# Patient Record
Sex: Female | Born: 1940 | Race: White | Hispanic: No | Smoking: Never smoker
Health system: Southern US, Community
[De-identification: ages and names within clinical notes are randomized; demographics above are authoritative.]

## PROBLEM LIST (undated history)

## (undated) DIAGNOSIS — D649 Anemia, unspecified: Secondary | ICD-10-CM

## (undated) DIAGNOSIS — H269 Unspecified cataract: Secondary | ICD-10-CM

## (undated) DIAGNOSIS — E039 Hypothyroidism, unspecified: Secondary | ICD-10-CM

## (undated) DIAGNOSIS — K859 Acute pancreatitis without necrosis or infection, unspecified: Secondary | ICD-10-CM

## (undated) DIAGNOSIS — G4730 Sleep apnea, unspecified: Secondary | ICD-10-CM

## (undated) DIAGNOSIS — I10 Essential (primary) hypertension: Secondary | ICD-10-CM

## (undated) DIAGNOSIS — E876 Hypokalemia: Secondary | ICD-10-CM

## (undated) DIAGNOSIS — L439 Lichen planus, unspecified: Secondary | ICD-10-CM

## (undated) DIAGNOSIS — Z5189 Encounter for other specified aftercare: Secondary | ICD-10-CM

## (undated) DIAGNOSIS — H409 Unspecified glaucoma: Secondary | ICD-10-CM

## (undated) DIAGNOSIS — K8050 Calculus of bile duct without cholangitis or cholecystitis without obstruction: Secondary | ICD-10-CM

## (undated) DIAGNOSIS — K5790 Diverticulosis of intestine, part unspecified, without perforation or abscess without bleeding: Secondary | ICD-10-CM

## (undated) DIAGNOSIS — T7840XA Allergy, unspecified, initial encounter: Secondary | ICD-10-CM

## (undated) DIAGNOSIS — M1990 Unspecified osteoarthritis, unspecified site: Secondary | ICD-10-CM

## (undated) DIAGNOSIS — J45909 Unspecified asthma, uncomplicated: Secondary | ICD-10-CM

## (undated) DIAGNOSIS — K589 Irritable bowel syndrome without diarrhea: Secondary | ICD-10-CM

## (undated) HISTORY — DX: Unspecified asthma, uncomplicated: J45.909

## (undated) HISTORY — PX: HERNIA REPAIR: SHX51

## (undated) HISTORY — DX: Irritable bowel syndrome, unspecified: K58.9

## (undated) HISTORY — DX: Diverticulosis of intestine, part unspecified, without perforation or abscess without bleeding: K57.90

## (undated) HISTORY — DX: Morbid (severe) obesity due to excess calories: E66.01

## (undated) HISTORY — DX: Unspecified glaucoma: H40.9

## (undated) HISTORY — DX: Allergy, unspecified, initial encounter: T78.40XA

## (undated) HISTORY — DX: Essential (primary) hypertension: I10

## (undated) HISTORY — PX: CHOLECYSTECTOMY: SHX55

## (undated) HISTORY — PX: EYE SURGERY: SHX253

## (undated) HISTORY — DX: Lichen planus, unspecified: L43.9

## (undated) HISTORY — PX: TUBAL LIGATION: SHX77

## (undated) HISTORY — DX: Unspecified cataract: H26.9

## (undated) HISTORY — DX: Unspecified osteoarthritis, unspecified site: M19.90

## (undated) HISTORY — DX: Hypothyroidism, unspecified: E03.9

## (undated) HISTORY — DX: Calculus of bile duct without cholangitis or cholecystitis without obstruction: K80.50

## (undated) HISTORY — DX: Anemia, unspecified: D64.9

## (undated) HISTORY — DX: Hypokalemia: E87.6

## (undated) HISTORY — DX: Acute pancreatitis without necrosis or infection, unspecified: K85.90

## (undated) HISTORY — DX: Encounter for other specified aftercare: Z51.89

## (undated) HISTORY — DX: Sleep apnea, unspecified: G47.30

---

## 1998-11-11 DIAGNOSIS — K8050 Calculus of bile duct without cholangitis or cholecystitis without obstruction: Secondary | ICD-10-CM

## 1998-11-11 HISTORY — DX: Calculus of bile duct without cholangitis or cholecystitis without obstruction: K80.50

## 2006-01-15 ENCOUNTER — Other Ambulatory Visit: Payer: Self-pay

## 2006-01-15 ENCOUNTER — Emergency Department: Payer: Self-pay | Admitting: Emergency Medicine

## 2007-07-09 ENCOUNTER — Ambulatory Visit: Payer: Self-pay | Admitting: Otolaryngology

## 2008-10-01 LAB — HM PAP SMEAR

## 2009-04-18 ENCOUNTER — Emergency Department: Payer: Self-pay | Admitting: Unknown Physician Specialty

## 2009-11-11 HISTORY — PX: COLONOSCOPY: SHX174

## 2009-12-26 ENCOUNTER — Ambulatory Visit: Payer: Self-pay | Admitting: Internal Medicine

## 2010-01-16 ENCOUNTER — Ambulatory Visit: Payer: Self-pay | Admitting: Internal Medicine

## 2010-03-14 ENCOUNTER — Ambulatory Visit: Payer: Self-pay | Admitting: Gastroenterology

## 2010-10-01 LAB — HM COLONOSCOPY: HM Colonoscopy: NORMAL

## 2011-07-16 ENCOUNTER — Encounter: Payer: Self-pay | Admitting: Internal Medicine

## 2011-07-18 ENCOUNTER — Encounter: Payer: Self-pay | Admitting: Internal Medicine

## 2011-07-18 ENCOUNTER — Ambulatory Visit (INDEPENDENT_AMBULATORY_CARE_PROVIDER_SITE_OTHER): Payer: Medicare Other | Admitting: Internal Medicine

## 2011-07-18 DIAGNOSIS — E039 Hypothyroidism, unspecified: Secondary | ICD-10-CM

## 2011-07-18 DIAGNOSIS — Z8679 Personal history of other diseases of the circulatory system: Secondary | ICD-10-CM | POA: Insufficient documentation

## 2011-07-18 DIAGNOSIS — I10 Essential (primary) hypertension: Secondary | ICD-10-CM

## 2011-07-18 DIAGNOSIS — Z0000 Encounter for general adult medical examination without abnormal findings: Secondary | ICD-10-CM

## 2011-07-18 LAB — LIPID PANEL
Cholesterol: 179 mg/dL (ref 0–200)
LDL Cholesterol: 93 mg/dL (ref 0–99)
Total CHOL/HDL Ratio: 3

## 2011-07-18 LAB — COMPREHENSIVE METABOLIC PANEL
ALT: 26 U/L (ref 0–35)
AST: 26 U/L (ref 0–37)
Albumin: 4.2 g/dL (ref 3.5–5.2)
BUN: 18 mg/dL (ref 6–23)
CO2: 27 mEq/L (ref 19–32)
Calcium: 8.7 mg/dL (ref 8.4–10.5)
Chloride: 106 mEq/L (ref 96–112)
Creatinine, Ser: 0.8 mg/dL (ref 0.4–1.2)
GFR: 74.72 mL/min (ref >60.00)
Potassium: 3.6 mEq/L (ref 3.5–5.1)

## 2011-07-18 NOTE — Patient Instructions (Signed)
Labs today. Return in 6 months or earlier as needed.

## 2011-07-18 NOTE — Progress Notes (Signed)
Subjective:    Patient ID: Cathy Coffey, female    DOB: 09/14/1941, 70 y.o.   MRN: [id number]  HPI Cathy Coffey is a 70 year old female who presents for annual exam. She denies any complaints today. She reports good compliance with her medications including Synthroid for hypothyroidism. She also occasionally uses Lasix for lower extremity edema. She also uses Estrace cream to help with vaginal dryness and urinary incontinence. She reports she has been feeling well. She reports good appetite. She has been very active and is back exercising at the gym on a regular basis.   Outpatient Encounter Prescriptions as of 07/18/2011  Medication Sig Dispense Refill  . estradiol (ESTRACE VAGINAL) 0.1 MG/GM vaginal cream Place 2 g vaginally as needed.        . furosemide (LASIX) 20 MG tablet Take 20 mg by mouth daily.        Marland Kitchen levothyroxine (SYNTHROID) 112 MCG tablet Take 112 mcg by mouth daily.        . meclizine (ANTIVERT) 25 MG tablet Take 25 mg by mouth 3 (three) times daily as needed.          Review of Systems  Constitutional: Negative for fever, chills, appetite change, fatigue and unexpected weight change.  HENT: Negative for ear pain, congestion, sore throat, trouble swallowing, neck pain, voice change and sinus pressure.   Eyes: Negative for visual disturbance.  Respiratory: Negative for cough, shortness of breath, wheezing and stridor.   Cardiovascular: Negative for chest pain, palpitations and leg swelling.  Gastrointestinal: Negative for nausea, vomiting, abdominal pain, diarrhea, constipation, blood in stool, abdominal distention and anal bleeding.  Genitourinary: Negative for dysuria and flank pain.  Musculoskeletal: Negative for myalgias, arthralgias and gait problem.  Skin: Negative for color change and rash.  Neurological: Negative for dizziness, weakness and headaches.  Hematological: Negative for adenopathy. Does not bruise/bleed easily.  Psychiatric/Behavioral: Negative for  suicidal ideas, sleep disturbance and dysphoric mood. The patient is not nervous/anxious.        BP 116/82  Pulse 67  Temp(Src) 98.1 F (36.7 C) (Oral)  Resp 14  Ht 5\' 2"  (1.575 m)  Wt 189 lb (85.73 kg)  BMI 34.57 kg/m2  SpO2 96%   Objective:   Physical Exam  Constitutional: She is oriented to person, place, and time. She appears well-developed and well-nourished. No distress.  HENT:  Head: Normocephalic and atraumatic.  Right Ear: External ear normal.  Left Ear: External ear normal.  Nose: Nose normal.  Mouth/Throat: Oropharynx is clear and moist. No oropharyngeal exudate.  Eyes: Conjunctivae are normal. Pupils are equal, round, and reactive to light. Right eye exhibits no discharge. Left eye exhibits no discharge. No scleral icterus.  Neck: Normal range of motion. Neck supple. No tracheal deviation present. No thyromegaly present.  Cardiovascular: Normal rate, regular rhythm, normal heart sounds and intact distal pulses.  Exam reveals no gallop and no friction rub.   No murmur heard. Pulmonary/Chest: Effort normal and breath sounds normal. No respiratory distress. She has no wheezes. She has no rales. She exhibits no tenderness. Right breast exhibits no inverted nipple, no mass, no nipple discharge, no skin change and no tenderness. Left breast exhibits no inverted nipple, no mass, no nipple discharge and no skin change.  Abdominal: Soft. Bowel sounds are normal. She exhibits no distension and no mass. There is no tenderness. There is no rebound and no guarding.  Musculoskeletal: Normal range of motion. She exhibits no edema and no tenderness.  Lymphadenopathy:  She has no cervical adenopathy.  Neurological: She is alert and oriented to person, place, and time. No cranial nerve deficit. She exhibits normal muscle tone. Coordination normal.  Skin: Skin is warm and dry. No rash noted. She is not diaphoretic. No erythema. No pallor.  Psychiatric: She has a normal mood and affect.  Her behavior is normal. Judgment and thought content normal.          Assessment & Plan:  1. General Exam - patient presents for annual exam. Exam including breast exam is normal today. Pap smear was deferred as Pap smear last year was normal.  Patient is up-to-date on health maintenance. She reports recent mammogram. We will request records on this from previous medical office. Lab work is up to date. She will continue her current medications. Encouraged continued healthy diet and exercise. She will return to clinic in 6 months or earlier if needed.  2. Hypertension - BP well controlled. Continue furosemide as needed for lower extremity edema. Repeat BMP today.  3. Hypothyroidism - Continue synthroid. Repeat TSH today.

## 2011-07-23 ENCOUNTER — Telehealth: Payer: Self-pay | Admitting: Internal Medicine

## 2011-07-23 NOTE — Telephone Encounter (Signed)
Pt got lab results in mail has questions about the tsh  Please call pt

## 2011-07-23 NOTE — Telephone Encounter (Signed)
Because the range is 0.35-5.50 and she is at 1.38, she says that she thought it should be some where more in the middle. I advised her that as long as it is in that range it is okay, but told her that I would ask you if she should be concerned in anyway.

## 2011-07-23 NOTE — Telephone Encounter (Signed)
Called patient. She was asking if she should be concerned about the tsh because the range is

## 2011-07-24 NOTE — Telephone Encounter (Signed)
No, actually 1.38 is perfect.  Most physicians aim for 1-2 for TSH.  Anything within the normal range is okay. Unless, she is having any symptoms such as palpitations, anxiety, fatigue, etc.

## 2011-07-24 NOTE — Telephone Encounter (Signed)
Patient notified

## 2011-08-07 ENCOUNTER — Encounter: Payer: Self-pay | Admitting: Internal Medicine

## 2011-08-07 ENCOUNTER — Ambulatory Visit (INDEPENDENT_AMBULATORY_CARE_PROVIDER_SITE_OTHER): Payer: Medicare Other | Admitting: Internal Medicine

## 2011-08-07 DIAGNOSIS — E039 Hypothyroidism, unspecified: Secondary | ICD-10-CM

## 2011-08-07 DIAGNOSIS — R197 Diarrhea, unspecified: Secondary | ICD-10-CM

## 2011-08-07 MED ORDER — DIPHENOXYLATE-ATROPINE 2.5-0.025 MG PO TABS: 1.0000 | ORAL_TABLET | Freq: Four times a day (QID) | ORAL | Status: DC | PRN

## 2011-08-07 NOTE — Patient Instructions (Signed)
Labs today. Start using lomotil as needed for severe diarrhea. Return to clinic in 2 weeks.

## 2011-08-07 NOTE — Progress Notes (Signed)
Subjective:    Patient ID: Cathy Coffey, female    DOB: 1941/06/16, 70 y.o.   MRN: [id number]  HPI Cathy Coffey is a 70 year old female who presents for an acute visit complaining of intermittent diarrhea. She reports that for the last 6 years she has had intermittent watery diarrhea. Over the last several months this has become much more frequent, typically occurring every couple of days. Her diarrhea is described as urgent and explosive. Her stool is brown or yellow in color. She denies any blood in her stool or blood on the toilet paper with wiping. She denies any abdominal pain. She denies any fever or chills. The episodes have been difficult to control and at times she has had incontinence of stool. At one point she reports using toilet paper to try to prevent an accident. She reports having colonoscopies in the past which were normal. The last colonoscopy being approximately 2 or 3 years ago.  Outpatient Encounter Prescriptions as of 08/07/2011  Medication Sig Dispense Refill  . estradiol (ESTRACE VAGINAL) 0.1 MG/GM vaginal cream Place 2 g vaginally as needed.        . furosemide (LASIX) 20 MG tablet Take 20 mg by mouth daily.        Marland Kitchen levothyroxine (SYNTHROID, LEVOTHROID) 100 MCG tablet Take 100 mcg by mouth daily.        . meclizine (ANTIVERT) 25 MG tablet Take 25 mg by mouth 3 (three) times daily as needed.        . diphenoxylate-atropine (LOMOTIL) 2.5-0.025 MG per tablet Take 1 tablet by mouth 4 (four) times daily as needed for diarrhea/loose stools.  30 tablet  1    Review of Systems  Constitutional: Negative for fever, chills, appetite change, fatigue and unexpected weight change.  HENT: Negative for ear pain, congestion, sore throat, trouble swallowing, neck pain, voice change and sinus pressure.   Eyes: Negative for visual disturbance.  Respiratory: Negative for cough, shortness of breath, wheezing and stridor.   Cardiovascular: Negative for chest pain, palpitations and leg  swelling.  Gastrointestinal: Positive for abdominal pain, diarrhea and abdominal distention. Negative for nausea, vomiting, constipation, blood in stool and anal bleeding.  Genitourinary: Negative for dysuria and flank pain.  Musculoskeletal: Negative for myalgias, arthralgias and gait problem.  Skin: Negative for color change and rash.  Neurological: Negative for dizziness and headaches.  Hematological: Negative for adenopathy. Does not bruise/bleed easily.  Psychiatric/Behavioral: Negative for suicidal ideas, sleep disturbance and dysphoric mood. The patient is not nervous/anxious.    BP 116/79  Pulse 83  Temp(Src) 98.2 F (36.8 C) (Oral)  Resp 16  Wt 191 lb 8 oz (86.864 kg)  SpO2 97%     Objective:   Physical Exam  Constitutional: She is oriented to person, place, and time. She appears well-developed and well-nourished. No distress.  HENT:  Head: Normocephalic and atraumatic.  Right Ear: External ear normal.  Left Ear: External ear normal.  Nose: Nose normal.  Mouth/Throat: Oropharynx is clear and moist. No oropharyngeal exudate.  Eyes: Conjunctivae are normal. Pupils are equal, round, and reactive to light. Right eye exhibits no discharge. Left eye exhibits no discharge. No scleral icterus.  Neck: Normal range of motion. Neck supple. No tracheal deviation present. No thyromegaly present.  Cardiovascular: Normal rate, regular rhythm, normal heart sounds and intact distal pulses.  Exam reveals no gallop and no friction rub.   No murmur heard. Pulmonary/Chest: Effort normal and breath sounds normal. No respiratory distress. She has no wheezes.  She has no rales. She exhibits no tenderness.  Abdominal: Soft. Bowel sounds are normal. She exhibits no distension and no mass. There is no tenderness. There is no rebound and no guarding.  Musculoskeletal: Normal range of motion. She exhibits no edema and no tenderness.  Lymphadenopathy:    She has no cervical adenopathy.  Neurological:  She is alert and oriented to person, place, and time. No cranial nerve deficit. She exhibits normal muscle tone. Coordination normal.  Skin: Skin is warm and dry. No rash noted. She is not diaphoretic. No erythema. No pallor.  Psychiatric: She has a normal mood and affect. Her behavior is normal. Judgment and thought content normal.          Assessment & Plan:  1. Diarrhea -patient with intermittent diarrhea which has been occurring for several years. It appears that these episodes have recently become more frequent. She has no signs of infection such as fever or abdominal pain. There is no blood in her stool or abdominal pain to suggest diverticulitis. She reports normal colonoscopies in the past and has never had a diagnosis of inflammatory bowel disease. Her symptoms may be consistent with irritable bowel syndrome. She has had no improvement with over-the-counter probiotics or dietary modification. Given the severity of her symptoms, will try using Lomotil on a when necessary basis. We will send blood work including CBC, CMP, lipase, celiac panel, and TSH today. We will have her followup in 2 weeks. If she does not have significant improvement in her symptoms she will likely need a repeat colonoscopy.  2. Hypothyroidism - will check TSH with labs today.

## 2011-08-08 LAB — CBC WITH DIFFERENTIAL/PLATELET
Basophils Relative: 0.1 % (ref 0.0–3.0)
Eosinophils Absolute: 0.3 10*3/uL (ref 0.0–0.7)
Lymphocytes Relative: 21.7 % (ref 12.0–46.0)
MCHC: 33 g/dL (ref 30.0–36.0)
MCV: 90.8 fl (ref 78.0–100.0)
Monocytes Absolute: 0.7 10*3/uL (ref 0.1–1.0)
Neutrophils Relative %: 64.4 % (ref 43.0–77.0)
Platelets: 207 10*3/uL (ref 150.0–400.0)
RBC: 4.5 Mil/uL (ref 3.87–5.11)
WBC: 7.2 10*3/uL (ref 4.5–10.5)

## 2011-08-08 LAB — GLIADIN ANTIBODIES, SERUM: Gliadin IgA: 4 U/mL (ref <20)

## 2011-08-09 ENCOUNTER — Telehealth: Payer: Self-pay | Admitting: Internal Medicine

## 2011-08-09 ENCOUNTER — Other Ambulatory Visit: Payer: Medicare Other

## 2011-08-09 LAB — COMPREHENSIVE METABOLIC PANEL
ALT: 25 U/L (ref 0–35)
AST: 25 U/L (ref 0–37)
Albumin: 4.2 g/dL (ref 3.5–5.2)
Alkaline Phosphatase: 84 U/L (ref 39–117)
BUN: 18 mg/dL (ref 6–23)
Calcium: 9.3 mg/dL (ref 8.4–10.5)
Chloride: 105 mEq/L (ref 96–112)
Potassium: 3.8 mEq/L (ref 3.5–5.1)
Sodium: 139 mEq/L (ref 135–145)
Total Protein: 7.2 g/dL (ref 6.0–8.3)

## 2011-08-09 LAB — LIPASE: Lipase: 28 U/L (ref 11.0–59.0)

## 2011-08-09 LAB — RETICULIN ANTIBODIES, IGA W TITER: Reticulin Ab, IgA: NEGATIVE

## 2011-08-09 NOTE — Telephone Encounter (Signed)
Message copied by Virgina Evener on Fri Aug 09, 2011  6:44 PM ------      Message from: Ronna Polio A      Created: Fri Aug 09, 2011  6:28 PM       Repeat potassium was normal.

## 2011-08-12 ENCOUNTER — Encounter: Payer: Self-pay | Admitting: Internal Medicine

## 2011-08-12 ENCOUNTER — Encounter: Payer: Self-pay | Admitting: *Deleted

## 2011-08-12 NOTE — Telephone Encounter (Signed)
Patient notified by letter

## 2011-08-20 ENCOUNTER — Encounter: Payer: Self-pay | Admitting: Internal Medicine

## 2011-08-22 ENCOUNTER — Other Ambulatory Visit: Payer: Self-pay | Admitting: Internal Medicine

## 2011-08-22 ENCOUNTER — Ambulatory Visit: Payer: Medicare Other | Admitting: Internal Medicine

## 2011-08-22 MED ORDER — FUROSEMIDE 20 MG PO TABS: 20.0000 mg | ORAL_TABLET | Freq: Every day | ORAL | Status: DC

## 2011-08-22 NOTE — Telephone Encounter (Signed)
Patient needs a refill on her generic lasix.

## 2011-10-21 ENCOUNTER — Ambulatory Visit (INDEPENDENT_AMBULATORY_CARE_PROVIDER_SITE_OTHER): Payer: Medicare Other | Admitting: Internal Medicine

## 2011-10-21 ENCOUNTER — Encounter: Payer: Self-pay | Admitting: Internal Medicine

## 2011-10-21 VITALS — BP 120/78 | HR 84 | Temp 98.4°F | Wt 192.0 lb

## 2011-10-21 DIAGNOSIS — J40 Bronchitis, not specified as acute or chronic: Secondary | ICD-10-CM

## 2011-10-21 MED ORDER — PREDNISONE (PAK) 10 MG PO TABS: ORAL_TABLET | ORAL | Status: AC

## 2011-10-21 MED ORDER — GUAIFENESIN-CODEINE 100-10 MG/5ML PO SYRP: 5.0000 mL | ORAL_SOLUTION | Freq: Two times a day (BID) | ORAL | Status: DC | PRN

## 2011-10-21 MED ORDER — AZITHROMYCIN 250 MG PO TABS: ORAL_TABLET | ORAL | Status: AC

## 2011-10-21 NOTE — Progress Notes (Signed)
  Subjective:    Patient ID: Cathy Coffey, female    DOB: 13-Jul-1941, 70 y.o.   MRN: [id number]  Cough This is a new problem. The current episode started in the past 7 days. The problem has been gradually worsening. The problem occurs constantly. The cough is productive of purulent sputum. Associated symptoms include chest pain, chills, ear pain, a fever, nasal congestion, postnasal drip, rhinorrhea, shortness of breath and wheezing. Pertinent negatives include no myalgias. The symptoms are aggravated by dust. She has tried OTC cough suppressant and rest for the symptoms. The treatment provided no relief. Her past medical history is significant for bronchitis.      Review of Systems  Constitutional: Positive for fever and chills.  HENT: Positive for ear pain, rhinorrhea and postnasal drip.   Respiratory: Positive for cough, shortness of breath and wheezing.   Cardiovascular: Positive for chest pain.  Musculoskeletal: Negative for myalgias.       Objective:   Physical Exam  Constitutional: She is oriented to person, place, and time. She appears well-developed and well-nourished. No distress.  HENT:  Head: Normocephalic and atraumatic.  Right Ear: External ear normal. Tympanic membrane is erythematous. A middle ear effusion is present.  Left Ear: External ear normal. Tympanic membrane is erythematous. A middle ear effusion is present.  Nose: Nose normal.  Mouth/Throat: Oropharynx is clear and moist. No oropharyngeal exudate.  Eyes: Conjunctivae are normal. Pupils are equal, round, and reactive to light. Right eye exhibits no discharge. Left eye exhibits no discharge. No scleral icterus.  Neck: Normal range of motion. Neck supple. No tracheal deviation present. No thyromegaly present.  Cardiovascular: Normal rate, regular rhythm, normal heart sounds and intact distal pulses.  Exam reveals no gallop and no friction rub.   No murmur heard. Pulmonary/Chest: Effort normal. No accessory  muscle usage. Not tachypneic. No respiratory distress. She has decreased breath sounds. She has wheezes. She has rhonchi. She has no rales. She exhibits no tenderness.  Musculoskeletal: Normal range of motion. She exhibits no edema and no tenderness.  Lymphadenopathy:    She has no cervical adenopathy.  Neurological: She is alert and oriented to person, place, and time. No cranial nerve deficit. She exhibits normal muscle tone. Coordination normal.  Skin: Skin is warm and dry. No rash noted. She is not diaphoretic. No erythema. No pallor.  Psychiatric: She has a normal mood and affect. Her behavior is normal. Judgment and thought content normal.          Assessment & Plan:  1. Bronchitis - Will treat with prednisone taper and azithromycin. Pt will use codeine for cough. She will use mucinex and ibuprofen prn. Follow up in 2 weeks or sooner if symptoms not improving.

## 2011-11-18 ENCOUNTER — Other Ambulatory Visit: Payer: Self-pay | Admitting: *Deleted

## 2011-11-18 MED ORDER — FUROSEMIDE 20 MG PO TABS: 20.0000 mg | ORAL_TABLET | Freq: Every day | ORAL | Status: DC

## 2011-11-18 MED ORDER — LEVOTHYROXINE SODIUM 100 MCG PO TABS: 100.0000 ug | ORAL_TABLET | Freq: Every day | ORAL | Status: DC

## 2011-11-18 NOTE — Progress Notes (Signed)
Faxed to mail order pharm. [phone] (fax)  [phone] (phone)

## 2011-12-13 ENCOUNTER — Telehealth: Payer: Self-pay | Admitting: *Deleted

## 2011-12-13 NOTE — Telephone Encounter (Signed)
Patient requesting referral to podiatist for repair of bunion.

## 2011-12-16 NOTE — Telephone Encounter (Signed)
Gave pt # and she will call if referral from our office is needed. [phone] [facility]/[place])

## 2011-12-16 NOTE — Telephone Encounter (Signed)
That is fine. Would recommend Dr. Irving Shows with [facility]

## 2011-12-16 NOTE — Telephone Encounter (Signed)
Pt would like to see MD in [place]. [facility] is in [place], can you suggest anyone else?

## 2011-12-16 NOTE — Telephone Encounter (Signed)
They have office in [place]

## 2011-12-24 ENCOUNTER — Telehealth: Payer: Self-pay | Admitting: Internal Medicine

## 2011-12-24 DIAGNOSIS — R197 Diarrhea, unspecified: Secondary | ICD-10-CM

## 2011-12-24 NOTE — Telephone Encounter (Signed)
Pt called to get refill on diphen atropine tab [employer]

## 2011-12-24 NOTE — Telephone Encounter (Signed)
Fine to fill. 

## 2011-12-25 MED ORDER — DIPHENOXYLATE-ATROPINE 2.5-0.025 MG PO TABS: 1.0000 | ORAL_TABLET | Freq: Four times a day (QID) | ORAL | Status: DC | PRN

## 2011-12-25 NOTE — Telephone Encounter (Signed)
Done,  Patient informed

## 2012-01-22 ENCOUNTER — Ambulatory Visit (INDEPENDENT_AMBULATORY_CARE_PROVIDER_SITE_OTHER): Payer: Medicare Other | Admitting: Internal Medicine

## 2012-01-22 ENCOUNTER — Telehealth: Payer: Self-pay | Admitting: *Deleted

## 2012-01-22 ENCOUNTER — Encounter: Payer: Self-pay | Admitting: Internal Medicine

## 2012-01-22 VITALS — BP 120/82 | HR 114 | Temp 98.2°F | Ht 62.0 in | Wt 193.0 lb

## 2012-01-22 DIAGNOSIS — J40 Bronchitis, not specified as acute or chronic: Secondary | ICD-10-CM | POA: Insufficient documentation

## 2012-01-22 MED ORDER — FLUTICASONE-SALMETEROL 250-50 MCG/DOSE IN AEPB: 1.0000 | INHALATION_SPRAY | Freq: Two times a day (BID) | RESPIRATORY_TRACT | Status: DC

## 2012-01-22 MED ORDER — GUAIFENESIN-CODEINE 100-10 MG/5ML PO SYRP: 5.0000 mL | ORAL_SOLUTION | Freq: Two times a day (BID) | ORAL | Status: AC | PRN

## 2012-01-22 MED ORDER — AMOXICILLIN-POT CLAVULANATE 875-125 MG PO TABS: 1.0000 | ORAL_TABLET | Freq: Two times a day (BID) | ORAL | Status: DC

## 2012-01-22 NOTE — Assessment & Plan Note (Signed)
Symptoms consistent with bronchitis. Will treat with augmentin, given pt recent history of general anesthesia and possibility of aspiration.  Pt will call if symptoms not improving in next 48hr. If no improvement, will get CXR.

## 2012-01-22 NOTE — Telephone Encounter (Signed)
Triage Record Num: [id number] Operator: Geanie Berlin Patient Name: Cathy Coffey Call Date & Time: 01/22/2012 2:46:57PM Patient Phone: [phone] PCP: Ronna Polio Patient Gender: Female PCP Fax : [phone] Patient DOB: 01/04/1941 Practice Name: [facility] Reason for Call: Caller: [name]/Patient; PCP: Ronna Polio; CB#: [phone]; Call regarding Cough/Congestion; Mild cough began approx 01/03/12 and progressed to productive, frequent cough since approx 01/11/12. Out of Advair. Requesting antibiotic for "bronchitis." Informed of MD order that must be seen for antibiotic. Declined triage. Appt scheduled for 1545 01/22/12 with Dr. Henreitta Leber for caller requesting appt per PCP Call Guideline. Protocol(s) Used: PCP Calls, No Triage (Adult) Recommended Outcome per Protocol: Call Provider within 72 Hours Override Outcome if Used in Protocol: Information Noted and Sent to Office RN Reason for Override Outcome: Rn Scheduled Appt For Patient. Reason for Outcome: Caller requesting an appointment, triage offered and declined Care Advice: ~ 01/22/2012 2:58:40PM Page 1 of 1 [email or web address]

## 2012-01-22 NOTE — Progress Notes (Signed)
Subjective:    Patient ID: Cathy Coffey, female    DOB: [birth year], 71 y.o.   MRN: [id number]  HPI 71YO female presents for acute visit c/o 3 week history of cough productive of purulent sputum.  Symptoms first began after recent surgical procedure on her foot.  She questions whether exposure to general anesthesia started cough.  She recently completed course of unknown antibiotic for possible wound infection in her foot, but denies any improvement in her cough with this.  She denies dyspnea or chest pain.  She has had chills but no fever.  She has been taking Codeine based cough syrup with minimal improvement in symptoms.  Outpatient Encounter Prescriptions as of 01/22/2012  Medication Sig Dispense Refill  . diphenoxylate-atropine (LOMOTIL) 2.5-0.025 MG per tablet Take 1 tablet by mouth 4 (four) times daily as needed for diarrhea or loose stools.  30 tablet  1  . estradiol (ESTRACE VAGINAL) 0.1 MG/GM vaginal cream Place 2 g vaginally as needed.        . furosemide (LASIX) 20 MG tablet Take 1 tablet (20 mg total) by mouth daily.  90 tablet  3  . guaiFENesin-codeine (ROBITUSSIN AC) 100-10 MG/5ML syrup Take 5 mLs by mouth 2 (two) times daily as needed for cough.  240 mL  0  . levothyroxine (SYNTHROID, LEVOTHROID) 100 MCG tablet Take 1 tablet (100 mcg total) by mouth daily.  90 tablet  3  . meclizine (ANTIVERT) 25 MG tablet Take 25 mg by mouth 3 (three) times daily as needed.        Marland Kitchen DISCONTD: guaiFENesin-codeine (ROBITUSSIN AC) 100-10 MG/5ML syrup Take 5 mLs by mouth 2 (two) times daily as needed for cough.  240 mL  0  . amoxicillin-clavulanate (AUGMENTIN) 875-125 MG per tablet Take 1 tablet by mouth 2 (two) times daily.  20 tablet  0  . Fluticasone-Salmeterol (ADVAIR DISKUS) 250-50 MCG/DOSE AEPB Inhale 1 puff into the lungs 2 (two) times daily.  1 each  3    Review of Systems  Constitutional: Positive for chills. Negative for fever and unexpected weight change.  HENT: Negative for hearing  loss, ear pain, nosebleeds, congestion, sore throat, facial swelling, rhinorrhea, sneezing, mouth sores, trouble swallowing, neck pain, neck stiffness, voice change, postnasal drip, sinus pressure, tinnitus and ear discharge.   Eyes: Negative for pain, discharge, redness and visual disturbance.  Respiratory: Positive for cough. Negative for chest tightness, shortness of breath, wheezing and stridor.   Cardiovascular: Negative for chest pain, palpitations and leg swelling.  Musculoskeletal: Negative for myalgias and arthralgias.  Skin: Negative for color change and rash.  Neurological: Negative for dizziness, weakness, light-headedness and headaches.  Hematological: Negative for adenopathy.   BP 120/82  Pulse 114  Temp(Src) 98.2 F (36.8 C) (Oral)  Ht 5\' 2"  (1.575 m)  Wt 193 lb (87.544 kg)  BMI 35.30 kg/m2  SpO2 96%     Objective:   Physical Exam  Constitutional: She is oriented to person, place, and time. She appears well-developed and well-nourished. No distress.  HENT:  Head: Normocephalic and atraumatic.  Right Ear: External ear normal. Tympanic membrane is erythematous and bulging.  Left Ear: External ear normal. Tympanic membrane is not erythematous and not bulging.  Nose: Nose normal.  Mouth/Throat: Oropharynx is clear and moist. No oropharyngeal exudate.  Eyes: Conjunctivae are normal. Pupils are equal, round, and reactive to light. Right eye exhibits no discharge. Left eye exhibits no discharge. No scleral icterus.  Neck: Normal range of motion. Neck supple. No  tracheal deviation present. No thyromegaly present.  Cardiovascular: Normal rate, regular rhythm, normal heart sounds and intact distal pulses.  Exam reveals no gallop and no friction rub.   No murmur heard. Pulmonary/Chest: Effort normal. No accessory muscle usage. Not tachypneic. No respiratory distress. She has no wheezes. She has rhonchi (diffuse). She has no rales. She exhibits no tenderness.  Musculoskeletal:  Normal range of motion. She exhibits no edema and no tenderness.  Lymphadenopathy:    She has no cervical adenopathy.  Neurological: She is alert and oriented to person, place, and time. No cranial nerve deficit. She exhibits normal muscle tone. Coordination normal.  Skin: Skin is warm and dry. No rash noted. She is not diaphoretic. No erythema. No pallor.  Psychiatric: She has a normal mood and affect. Her behavior is normal. Judgment and thought content normal.          Assessment & Plan:

## 2012-01-28 ENCOUNTER — Telehealth: Payer: Self-pay | Admitting: *Deleted

## 2012-01-28 NOTE — Telephone Encounter (Signed)
Patient notified. She will give it a few more days and if she is not feeling any better at that point will call back and schedule appt.

## 2012-01-28 NOTE — Telephone Encounter (Signed)
Triage Record Num: [id number] Operator: Craig Guess Patient Name: Cathy Coffey Call Date & Time: 01/28/2012 11:30:42AM Patient Phone: [phone] PCP: Ronna Polio Patient Gender: Female PCP Fax : [phone] Patient DOB: [birth year] Practice Name: [facility] Reason for Call: Caller: [name]/Patient; PCP: Ronna Polio; CB#: [phone]. Caller reports she was seen in the office on Wed 3/13 and dx'd with Bronchitis and possibly Pneumonia. Caller was advised to callback if sxs did not improve. Started on Augmentin bid and is still having lots of congestion. Afebrile. Caller reports she is no better than when she was seen, but no worse. Still taking Advair, Antibxs and Mucinex as directed. Caller asking for direction and would like MD to be aware of same. Caller questions if Mucinex will cause increased coughing. Caller given info and advised Mucinex will increase coughing as it breaks up congestion. Advised to increase fluid intake. Caller now reports she will give antibxs a few more days, but would like to speak with MD when she is available. Protocol(s) Used: Office Note Recommended Outcome per Protocol: Information Noted and Sent to Office Reason for Outcome: Caller information to office Care Advice: ~ 03/

## 2012-01-28 NOTE — Telephone Encounter (Signed)
If no improvement by tomorrow, should be seen, however cough from viral infection can persist up to 1 month.

## 2012-02-27 ENCOUNTER — Encounter: Payer: Self-pay | Admitting: Internal Medicine

## 2012-03-02 ENCOUNTER — Ambulatory Visit (INDEPENDENT_AMBULATORY_CARE_PROVIDER_SITE_OTHER): Payer: Medicare Other | Admitting: Internal Medicine

## 2012-03-02 ENCOUNTER — Encounter: Payer: Self-pay | Admitting: Internal Medicine

## 2012-03-02 VITALS — BP 130/74 | HR 71 | Temp 98.1°F | Resp 16 | Wt 194.5 lb

## 2012-03-02 DIAGNOSIS — H6690 Otitis media, unspecified, unspecified ear: Secondary | ICD-10-CM | POA: Insufficient documentation

## 2012-03-02 DIAGNOSIS — H6691 Otitis media, unspecified, right ear: Secondary | ICD-10-CM

## 2012-03-02 MED ORDER — LEVOFLOXACIN 500 MG PO TABS: 500.0000 mg | ORAL_TABLET | Freq: Every day | ORAL | Status: AC

## 2012-03-02 NOTE — Patient Instructions (Signed)
.  You have a sinus/ear infection   .  I am prescribing an antibiotic (levaquin ) to manage the infection and the inflammation in your ear/sinuses.   I also advise use of the following OTC meds to help with your other symptoms.   Take generic OTC benadryl 25 mg every 8 hours for the drainage,  Sudafed PE  10 to 30 mg every 8 hours for the congestion, you may substitute Afrin nasal spray for the nighttime dose of sudafed PE  If needed to prevent insomnia.  flushes your sinuses twice daily with Simply Saline (do over the sink because if you do it right you will spit out globs of mucus)  Use benzonatate capsules or OTC  Delsym   FOR THE COUGH.  Gargle with salt water as needed for sore throat.   Use vicodin for ear pain

## 2012-03-02 NOTE — Progress Notes (Signed)
Patient ID: Cathy Coffey, female   DOB: 02/11/1941, 71 y.o.   MRN: [id number]   Patient Active Problem List  Diagnoses  . Hypertension  . Hypothyroidism  . Bronchitis  . Otitis media    Subjective:  CC:   Chief Complaint  Patient presents with  . Lymphadenopathy    HPI:   Cathy Coffey a 71 y.o. female who presents 4 day history of allergic rhinitis aggravated by outside activities.  Saturday throat felt swollen .  By Sunday night she had noticed some right-sided lymphadenopathy and pain in her right ear with increasing pressure..  She has been taking mucinex without a decongestant for management of green nasal drainage. No sinus pain but having frontal headaches which are new.   No fevers.     Past Medical History  Diagnosis Date  . Asthma   . Hypothyroidism   . Hypertension   . Lichen planus     Past Surgical History  Procedure Date  . Cholecystectomy   . Hernia repair          The following portions of the patient's history were reviewed and updated as appropriate: Allergies, current medications, and problem list.    Review of Systems:   12 Pt  review of systems was negative except those addressed in the HPI,     History   Social History  . Marital Status: Divorced    Spouse Name: N/A    Number of Children: N/A  . Years of Education: N/A   Occupational History  . Not on file.   Social History Main Topics  . Smoking status: Never Smoker   . Smokeless tobacco: Never Used  . Alcohol Use: Not on file  . Drug Use: Not on file  . Sexually Active: Not on file   Other Topics Concern  . Not on file   Social History Narrative  . No narrative on file    Objective:  BP 130/74  Pulse 71  Temp(Src) 98.1 F (36.7 C) (Oral)  Resp 16  Wt 194 lb 8 oz (88.225 kg)  SpO2 96%  General appearance: alert, cooperative and appears stated age Ears: normal TM's and external ear canals both ears Throat: lips, mucosa, and tongue normal; teeth and  gums normal Neck: no adenopathy, no carotid bruit, supple, symmetrical, trachea midline and thyroid not enlarged, symmetric, no tenderness/mass/nodules Back: symmetric, no curvature. ROM normal. No CVA tenderness. Lungs: clear to auscultation bilaterally Heart: regular rate and rhythm, S1, S2 normal, no murmur, click, rub or gallop Abdomen: soft, non-tender; bowel sounds normal; no masses,  no organomegaly Pulses: 2+ and symmetric Skin: Skin color, texture, turgor normal. No rashes or lesions Lymph nodes: Cervical, supraclavicular, and axillary nodes normal.  Assessment and Plan:  Otitis media Right ear,  With pain , cervical LAD and headache.  Recently treated with amox/clav one month ago so will treat with levaquin.     Updated Medication List Outpatient Encounter Prescriptions as of 03/02/2012  Medication Sig Dispense Refill  . diphenoxylate-atropine (LOMOTIL) 2.5-0.025 MG per tablet Take 1 tablet by mouth 4 (four) times daily as needed for diarrhea or loose stools.  30 tablet  1  . estradiol (ESTRACE VAGINAL) 0.1 MG/GM vaginal cream Place 2 g vaginally as needed.        . Fluticasone-Salmeterol (ADVAIR DISKUS) 250-50 MCG/DOSE AEPB Inhale 1 puff into the lungs 2 (two) times daily.  1 each  3  . furosemide (LASIX) 20 MG tablet Take 1 tablet (20  mg total) by mouth daily.  90 tablet  3  . guaiFENesin-codeine (ROBITUSSIN AC) 100-10 MG/5ML syrup Take 5 mLs by mouth 2 (two) times daily as needed for cough.  240 mL  0  . levothyroxine (SYNTHROID, LEVOTHROID) 100 MCG tablet Take 1 tablet (100 mcg total) by mouth daily.  90 tablet  3  . meclizine (ANTIVERT) 25 MG tablet Take 25 mg by mouth 3 (three) times daily as needed.        Marland Kitchen levofloxacin (LEVAQUIN) 500 MG tablet Take 1 tablet (500 mg total) by mouth daily.  7 tablet  0     No orders of the defined types were placed in this encounter.    No Follow-up on file.

## 2012-03-02 NOTE — Assessment & Plan Note (Addendum)
Right ear,  With pain , cervical LAD and headache.  Recently treated with amox/clav one month ago so will treat with levaquin.

## 2012-03-12 ENCOUNTER — Other Ambulatory Visit: Payer: Self-pay | Admitting: *Deleted

## 2012-03-12 DIAGNOSIS — R197 Diarrhea, unspecified: Secondary | ICD-10-CM

## 2012-03-12 MED ORDER — DIPHENOXYLATE-ATROPINE 2.5-0.025 MG PO TABS: 1.0000 | ORAL_TABLET | Freq: Four times a day (QID) | ORAL | Status: DC | PRN

## 2012-03-12 NOTE — Telephone Encounter (Signed)
Fine to refill #30 with 2 refill

## 2012-03-12 NOTE — Telephone Encounter (Signed)
Rx sent to pharmacy   

## 2012-03-12 NOTE — Telephone Encounter (Signed)
Request refill Lomotil [for IBS] [Last refill 02.13.13 #30x1] Please advise.

## 2012-04-10 ENCOUNTER — Telehealth: Payer: Self-pay | Admitting: Internal Medicine

## 2012-04-10 NOTE — Telephone Encounter (Signed)
Patient left a voice mail on 5.30.13 she would like to speak to the doctor.

## 2012-04-10 NOTE — Telephone Encounter (Signed)
Spoke w/caller who did not have time to discuss concerns w/me at that time, as she was taking her Dad to [facility]. Her first concern is regarding her mother, Roderic Scarce, and she will call back to leave a detailed message as to what we can help her with; so that I may forward these concerns to JAW at her convenience/SLS

## 2012-05-26 ENCOUNTER — Other Ambulatory Visit: Payer: Self-pay | Admitting: Internal Medicine

## 2012-07-30 ENCOUNTER — Other Ambulatory Visit: Payer: Self-pay | Admitting: *Deleted

## 2012-07-30 DIAGNOSIS — R197 Diarrhea, unspecified: Secondary | ICD-10-CM

## 2012-07-30 MED ORDER — DIPHENOXYLATE-ATROPINE 2.5-0.025 MG PO TABS: 1.0000 | ORAL_TABLET | Freq: Four times a day (QID) | ORAL | Status: DC | PRN

## 2012-07-30 NOTE — Telephone Encounter (Signed)
Rx called to [facility] pharmacy.

## 2012-10-01 ENCOUNTER — Encounter: Payer: Self-pay | Admitting: Internal Medicine

## 2012-10-01 ENCOUNTER — Ambulatory Visit (INDEPENDENT_AMBULATORY_CARE_PROVIDER_SITE_OTHER): Payer: Medicare Other | Admitting: Internal Medicine

## 2012-10-01 VITALS — BP 120/80 | HR 61 | Temp 98.0°F | Resp 16 | Ht 60.0 in | Wt 192.0 lb

## 2012-10-01 DIAGNOSIS — D510 Vitamin B12 deficiency anemia due to intrinsic factor deficiency: Secondary | ICD-10-CM

## 2012-10-01 DIAGNOSIS — Z0000 Encounter for general adult medical examination without abnormal findings: Secondary | ICD-10-CM | POA: Insufficient documentation

## 2012-10-01 DIAGNOSIS — R197 Diarrhea, unspecified: Secondary | ICD-10-CM

## 2012-10-01 DIAGNOSIS — Z23 Encounter for immunization: Secondary | ICD-10-CM

## 2012-10-01 DIAGNOSIS — N390 Urinary tract infection, site not specified: Secondary | ICD-10-CM

## 2012-10-01 DIAGNOSIS — Z1239 Encounter for other screening for malignant neoplasm of breast: Secondary | ICD-10-CM

## 2012-10-01 DIAGNOSIS — E039 Hypothyroidism, unspecified: Secondary | ICD-10-CM

## 2012-10-01 DIAGNOSIS — E785 Hyperlipidemia, unspecified: Secondary | ICD-10-CM

## 2012-10-01 LAB — COMPREHENSIVE METABOLIC PANEL
ALT: 23 U/L (ref 0–35)
AST: 22 U/L (ref 0–37)
Albumin: 4.1 g/dL (ref 3.5–5.2)
Alkaline Phosphatase: 81 U/L (ref 39–117)
BUN: 18 mg/dL (ref 6–23)
Calcium: 9.1 mg/dL (ref 8.4–10.5)
Chloride: 102 mEq/L (ref 96–112)
Creatinine, Ser: 0.8 mg/dL (ref 0.4–1.2)
Potassium: 3.6 mEq/L (ref 3.5–5.1)

## 2012-10-01 LAB — CBC WITH DIFFERENTIAL/PLATELET
Basophils Absolute: 0 10*3/uL (ref 0.0–0.1)
Eosinophils Absolute: 0.3 10*3/uL (ref 0.0–0.7)
Lymphocytes Relative: 20.8 % (ref 12.0–46.0)
MCHC: 33 g/dL (ref 30.0–36.0)
MCV: 89.4 fl (ref 78.0–100.0)
Monocytes Absolute: 0.5 10*3/uL (ref 0.1–1.0)
Neutrophils Relative %: 65.8 % (ref 43.0–77.0)
Platelets: 209 10*3/uL (ref 150.0–400.0)
RDW: 13.8 % (ref 11.5–14.6)

## 2012-10-01 LAB — LIPID PANEL
HDL: 44.6 mg/dL (ref >39.00)
Total CHOL/HDL Ratio: 5
Triglycerides: 223 mg/dL — ABNORMAL HIGH (ref 0.0–149.0)

## 2012-10-01 LAB — POCT URINALYSIS DIPSTICK
Blood, UA: NEGATIVE
Glucose, UA: NEGATIVE
Nitrite, UA: NEGATIVE
Urobilinogen, UA: 0.2

## 2012-10-01 LAB — VITAMIN B12: Vitamin B-12: 776 pg/mL (ref 211–911)

## 2012-10-01 MED ORDER — PNEUMOCOCCAL VAC POLYVALENT 25 MCG/0.5ML IJ INJ: 0.5000 mL | INJECTION | Freq: Once | INTRAMUSCULAR | Status: DC

## 2012-10-01 MED ORDER — DIPHENOXYLATE-ATROPINE 2.5-0.025 MG PO TABS: 2.0000 | ORAL_TABLET | Freq: Four times a day (QID) | ORAL | Status: DC | PRN

## 2012-10-01 NOTE — Assessment & Plan Note (Signed)
Recently symptomatic with dry skin, fatigue, worsening depression. Will check TSH with labs today. Follow up 3 months and prn.

## 2012-10-01 NOTE — Assessment & Plan Note (Signed)
Gen exam normal today including breast exam. PAP and pelvic deferred because of age and h/o all normal PAP.  Will schedule mammogram.  Will check labs including CBC, CMP, lipids, TSH.  Follow up 3 months and prn.

## 2012-10-01 NOTE — Progress Notes (Signed)
Subjective:    Patient ID: ZAHNIYA ZELLARS, female    DOB: [birth year], 71 y.o.   MRN: [id number]  HPI The patient is here for annual Medicare wellness examination and management of other chronic and acute problems.   The risk factors are reflected in the social history.  The roster of all physicians providing medical care to patient - is listed in the Snapshot section of the chart.  Activities of daily living:  The patient is 100% independent in all ADLs: dressing, toileting, feeding as well as independent mobility  Home safety : The patient has smoke detectors in the home. They wear seatbelts.  There are no firearms at home. There is no violence in the home.   There is no risks for hepatitis, STDs or HIV. There is history of blood transfusion in 1960s. They have no travel history to infectious disease endemic areas of the world.  The patient has seen their dentist in the last six month. (Dr. Mathews Robinsons) They have seen their eye doctor in the last year.  [facility]) No issues with hearing They have deferred audiologic testing in the last year.    They do not  have excessive sun exposure. Discussed the need for sun protection: hats, long sleeves and use of sunscreen if there is significant sun exposure. Dermatologist - none recently.  Diet: the importance of a healthy diet is discussed. They do have a healthy diet.  The benefits of regular aerobic exercise were discussed. Limited by family responsibilities.  Depression screen: there are no signs or vegative symptoms of depression- irritability, change in appetite, anhedonia, sadness/tearfullness. Recent worsening of symptoms, with ongoing responsibilities caring for parents and granddaughter.  Cognitive assessment: the patient manages all their financial and personal affairs and is actively engaged. They could relate day,date,year and events.  The following portions of the patient's history were reviewed and updated as appropriate:  allergies, current medications, past family history, past medical history,  past surgical history, past social history  and problem list.  Visual acuity was not assessed per patient preference since she has regular follow up with her ophthalmologist. Hearing and body mass index were assessed and reviewed.   During the course of the visit the patient was educated and counseled about appropriate screening and preventive services including : fall prevention , diabetes screening, nutrition counseling, colorectal cancer screening, and recommended immunizations.     Outpatient Encounter Prescriptions as of 10/01/2012  Medication Sig Dispense Refill  . amoxicillin-clavulanate (AUGMENTIN) 875-125 MG per tablet TAKE 1 TABLET BY MOUTH TWICE A DAY  20 tablet  0  . Clobetasol Prop Emollient Base 0.05 % emollient cream Apply topically 2 (two) times daily.      . diphenoxylate-atropine (LOMOTIL) 2.5-0.025 MG per tablet Take 2 tablets by mouth 4 (four) times daily as needed for diarrhea or loose stools.  60 tablet  3  . estradiol (ESTRACE VAGINAL) 0.1 MG/GM vaginal cream Place 2 g vaginally as needed.        . Fluticasone-Salmeterol (ADVAIR DISKUS) 250-50 MCG/DOSE AEPB Inhale 1 puff into the lungs 2 (two) times daily.  1 each  3  . furosemide (LASIX) 20 MG tablet Take 1 tablet (20 mg total) by mouth daily.  90 tablet  3  . guaiFENesin-codeine (ROBITUSSIN AC) 100-10 MG/5ML syrup Take 5 mLs by mouth 2 (two) times daily as needed for cough.  240 mL  0  . levothyroxine (SYNTHROID, LEVOTHROID) 100 MCG tablet Take 1 tablet (100 mcg total) by mouth  daily.  90 tablet  3  . meclizine (ANTIVERT) 25 MG tablet Take 25 mg by mouth 3 (three) times daily as needed.        . [DISCONTINUED] diphenoxylate-atropine (LOMOTIL) 2.5-0.025 MG per tablet Take 1 tablet by mouth 4 (four) times daily as needed for diarrhea or loose stools.  30 tablet  0   Facility-Administered Encounter Medications as of 10/01/2012  Medication Dose Route  Frequency Provider Last Rate Last Dose  . pneumococcal 23 valent vaccine (PNU-IMMUNE) injection 0.5 mL  0.5 mL Intramuscular Once Shelia Media, MD        Review of Systems  Constitutional: Negative for fever, chills, appetite change, fatigue and unexpected weight change.  HENT: Negative for ear pain, congestion, sore throat, trouble swallowing, neck pain, voice change and sinus pressure.   Eyes: Negative for visual disturbance.  Respiratory: Negative for cough, shortness of breath, wheezing and stridor.   Cardiovascular: Negative for chest pain, palpitations and leg swelling.  Gastrointestinal: Negative for nausea, vomiting, abdominal pain, diarrhea, constipation, blood in stool, abdominal distention and anal bleeding.  Genitourinary: Negative for dysuria and flank pain.  Musculoskeletal: Negative for myalgias, arthralgias and gait problem.  Skin: Negative for color change and rash.  Neurological: Negative for dizziness and headaches.  Hematological: Negative for adenopathy. Does not bruise/bleed easily.  Psychiatric/Behavioral: Positive for dysphoric mood. Negative for suicidal ideas and sleep disturbance. The patient is not nervous/anxious.        Objective:   Physical Exam  Constitutional: She is oriented to person, place, and time. She appears well-developed and well-nourished. No distress.  HENT:  Head: Normocephalic and atraumatic.  Right Ear: External ear normal.  Left Ear: External ear normal.  Nose: Nose normal.  Mouth/Throat: Oropharynx is clear and moist. No oropharyngeal exudate.  Eyes: Conjunctivae normal are normal. Pupils are equal, round, and reactive to light. Right eye exhibits no discharge. Left eye exhibits no discharge. No scleral icterus.  Neck: Normal range of motion. Neck supple. No tracheal deviation present. No thyromegaly present.  Cardiovascular: Normal rate, regular rhythm, normal heart sounds and intact distal pulses.  Exam reveals no gallop and no  friction rub.   No murmur heard. Pulmonary/Chest: Breath sounds normal. No accessory muscle usage. Not tachypneic. No respiratory distress. She has no decreased breath sounds. She has no wheezes. She has no rhonchi. She has no rales. She exhibits no tenderness. Right breast exhibits no inverted nipple, no mass, no nipple discharge, no skin change and no tenderness. Left breast exhibits no inverted nipple, no mass, no nipple discharge, no skin change and no tenderness. Breasts are symmetrical.  Abdominal: Soft. Bowel sounds are normal. She exhibits no distension and no mass. There is no tenderness. There is no rebound and no guarding.  Musculoskeletal: Normal range of motion. She exhibits no edema and no tenderness.  Lymphadenopathy:    She has no cervical adenopathy.  Neurological: She is alert and oriented to person, place, and time. No cranial nerve deficit. She exhibits normal muscle tone. Coordination normal.  Skin: Skin is warm and dry. No rash noted. She is not diaphoretic. No erythema. No pallor.  Psychiatric: She has a normal mood and affect. Her behavior is normal. Judgment and thought content normal.          Assessment & Plan:

## 2012-10-02 ENCOUNTER — Telehealth: Payer: Self-pay | Admitting: Internal Medicine

## 2012-10-02 NOTE — Telephone Encounter (Signed)
Pt come in today wanting someone to call her about her labs she has some ?

## 2012-10-07 NOTE — Telephone Encounter (Signed)
Pt called back and labs explained.

## 2013-02-03 ENCOUNTER — Emergency Department: Payer: Self-pay | Admitting: Emergency Medicine

## 2013-02-03 LAB — CBC
HCT: 40 % (ref 35.0–47.0)
HGB: 13.2 g/dL (ref 12.0–16.0)
Platelet: 197 10*3/uL (ref 150–440)
RBC: 4.56 10*6/uL (ref 3.80–5.20)
RDW: 13.4 % (ref 11.5–14.5)

## 2013-02-03 LAB — BASIC METABOLIC PANEL
BUN: 14 mg/dL (ref 7–18)
Chloride: 103 mmol/L (ref 98–107)
Co2: 28 mmol/L (ref 21–32)
Creatinine: 0.94 mg/dL (ref 0.60–1.30)
EGFR (African American): >60
EGFR (Non-African Amer.): >60
Glucose: 102 mg/dL — ABNORMAL HIGH (ref 65–99)
Osmolality: 274 (ref 275–301)
Potassium: 3.8 mmol/L (ref 3.5–5.1)
Sodium: 137 mmol/L (ref 136–145)

## 2013-02-03 LAB — URINALYSIS, COMPLETE
Bacteria: NONE SEEN
Bilirubin,UR: NEGATIVE
Blood: NEGATIVE
Glucose,UR: NEGATIVE mg/dL (ref 0–75)
Ketone: NEGATIVE
Leukocyte Esterase: NEGATIVE
Ph: 8 (ref 4.5–8.0)
Protein: NEGATIVE
Squamous Epithelial: NONE SEEN
WBC UR: NONE SEEN /HPF (ref 0–5)

## 2013-02-03 LAB — TROPONIN I: Troponin-I: <0.02 ng/mL

## 2013-02-05 ENCOUNTER — Encounter: Payer: Self-pay | Admitting: Adult Health

## 2013-02-05 ENCOUNTER — Ambulatory Visit (INDEPENDENT_AMBULATORY_CARE_PROVIDER_SITE_OTHER): Payer: Medicare Other | Admitting: Adult Health

## 2013-02-05 VITALS — BP 102/80 | HR 85 | Temp 98.0°F | Resp 14 | Ht 60.0 in | Wt 188.0 lb

## 2013-02-05 DIAGNOSIS — R5381 Other malaise: Secondary | ICD-10-CM

## 2013-02-05 DIAGNOSIS — R5383 Other fatigue: Secondary | ICD-10-CM

## 2013-02-05 DIAGNOSIS — Z09 Encounter for follow-up examination after completed treatment for conditions other than malignant neoplasm: Secondary | ICD-10-CM

## 2013-02-05 LAB — TSH: TSH: 0.61 u[IU]/mL (ref 0.35–5.50)

## 2013-02-05 NOTE — Patient Instructions (Addendum)
  I am requesting your medical records from your previous visit to the emergency room.  Continue to take your blood pressure medication.  I am checking your thyroid function.  Once I get the results we will let you know.

## 2013-02-05 NOTE — Progress Notes (Signed)
Subjective:    Patient ID: Cathy Coffey, female    DOB: 1941-05-22, 72 y.o.   MRN: [id number]  HPI  Patient is a 72 year old female who presents to clinic after a visit to the emergency room on 02/03/13 for new-onset left arm paresthesia. Patient was also found to be hypertensive. Workup consisting of CT scan of the head without contrast, troponin, EKG, urinalysis, CBC, metabolic panel and chest x-ray were completed. Per hospital records patient had a normal neurological exam including normal sensory exam. Unclear etiology of paresthesia. Patient is feeling fatigued. Note, patient has been caring for her mother for the last 5 years. Mother is currently under hospice care with death being imminent. Patient reports that she has been under a considerable amount of stress.   Current Outpatient Prescriptions on File Prior to Visit  Medication Sig Dispense Refill  . Clobetasol Prop Emollient Base 0.05 % emollient cream Apply topically 2 (two) times daily.      . diphenoxylate-atropine (LOMOTIL) 2.5-0.025 MG per tablet Take 2 tablets by mouth 4 (four) times daily as needed for diarrhea or loose stools.  60 tablet  3  . estradiol (ESTRACE VAGINAL) 0.1 MG/GM vaginal cream Place 2 g vaginally as needed.        . furosemide (LASIX) 20 MG tablet Take 1 tablet (20 mg total) by mouth daily.  90 tablet  3  . levothyroxine (SYNTHROID, LEVOTHROID) 100 MCG tablet Take 1 tablet (100 mcg total) by mouth daily.  90 tablet  3  . Fluticasone-Salmeterol (ADVAIR DISKUS) 250-50 MCG/DOSE AEPB Inhale 1 puff into the lungs 2 (two) times daily.  1 each  3  . meclizine (ANTIVERT) 25 MG tablet Take 25 mg by mouth 3 (three) times daily as needed.         Current Facility-Administered Medications on File Prior to Visit  Medication Dose Route Frequency Provider Last Rate Last Dose  . pneumococcal 23 valent vaccine (PNU-IMMUNE) injection 0.5 mL  0.5 mL Intramuscular Once Wynona Dove, MD          Review of Systems   Constitutional: Positive for fatigue.  HENT: Positive for voice change and postnasal drip. Negative for sore throat.   Respiratory: Positive for cough. Negative for shortness of breath and wheezing.   Neurological: Positive for weakness and numbness. Negative for dizziness and syncope.       Slight numbness LUE which is improving.   Current Outpatient Prescriptions on File Prior to Visit  Medication Sig Dispense Refill  . Clobetasol Prop Emollient Base 0.05 % emollient cream Apply topically 2 (two) times daily.      . diphenoxylate-atropine (LOMOTIL) 2.5-0.025 MG per tablet Take 2 tablets by mouth 4 (four) times daily as needed for diarrhea or loose stools.  60 tablet  3  . estradiol (ESTRACE VAGINAL) 0.1 MG/GM vaginal cream Place 2 g vaginally as needed.        . furosemide (LASIX) 20 MG tablet Take 1 tablet (20 mg total) by mouth daily.  90 tablet  3  . levothyroxine (SYNTHROID, LEVOTHROID) 100 MCG tablet Take 1 tablet (100 mcg total) by mouth daily.  90 tablet  3  . Fluticasone-Salmeterol (ADVAIR DISKUS) 250-50 MCG/DOSE AEPB Inhale 1 puff into the lungs 2 (two) times daily.  1 each  3  . meclizine (ANTIVERT) 25 MG tablet Take 25 mg by mouth 3 (three) times daily as needed.         Current Facility-Administered Medications on File Prior to Visit  Medication Dose Route Frequency Provider Last Rate Last Dose  . pneumococcal 23 valent vaccine (PNU-IMMUNE) injection 0.5 mL  0.5 mL Intramuscular Once Wynona Dove, MD       BP 102/80  Pulse 85  Temp(Src) 98 F (36.7 C) (Oral)  Resp 14  Ht 5' (1.524 m)  Wt 188 lb (85.276 kg)  BMI 36.72 kg/m2  SpO2 95%     Objective:   Physical Exam  Constitutional: She is oriented to person, place, and time. She appears well-developed and well-nourished. No distress.  HENT:  Head: Normocephalic and atraumatic.  Right Ear: External ear normal.  Left Ear: External ear normal.  Eyes: Conjunctivae are normal. Pupils are equal, round, and  reactive to light.  Cardiovascular: Normal rate and regular rhythm.  Exam reveals no gallop.   No murmur heard. Pulmonary/Chest: Effort normal and breath sounds normal. She has no wheezes. She has no rales.  Musculoskeletal: Normal range of motion. She exhibits no edema.  Neurological: She is alert and oriented to person, place, and time. Coordination normal.  Skin: Skin is warm and dry.  Psychiatric: She has a normal mood and affect. Her behavior is normal. Judgment and thought content normal.       Assessment & Plan:

## 2013-02-05 NOTE — Assessment & Plan Note (Signed)
Patient was seen in the emergency room this past Wednesday for paresthesia of unknown etiology. Workup unremarkable. Patient was started on blood pressure medication: Lisinopril-HCTZ 20-25 mg daily. Blood pressure today is well controlled. I would check a TSH to make certain she remains therapeutic on her levothyroxine. I suspect her fatigue is secondary to the stresses of caring for her dying mother.

## 2013-05-20 ENCOUNTER — Other Ambulatory Visit: Payer: Self-pay | Admitting: Internal Medicine

## 2013-05-20 NOTE — Telephone Encounter (Signed)
Okay to refill? 

## 2013-05-21 ENCOUNTER — Other Ambulatory Visit: Payer: Self-pay | Admitting: Internal Medicine

## 2013-05-21 NOTE — Telephone Encounter (Signed)
Okay to refill? 

## 2013-07-27 ENCOUNTER — Encounter: Payer: Self-pay | Admitting: Internal Medicine

## 2013-07-27 ENCOUNTER — Ambulatory Visit (INDEPENDENT_AMBULATORY_CARE_PROVIDER_SITE_OTHER): Payer: Medicare Other | Admitting: Internal Medicine

## 2013-07-27 VITALS — BP 140/100 | HR 63 | Temp 97.8°F | Ht 60.0 in | Wt 182.0 lb

## 2013-07-27 DIAGNOSIS — E785 Hyperlipidemia, unspecified: Secondary | ICD-10-CM

## 2013-07-27 DIAGNOSIS — I10 Essential (primary) hypertension: Secondary | ICD-10-CM

## 2013-07-27 DIAGNOSIS — Z1239 Encounter for other screening for malignant neoplasm of breast: Secondary | ICD-10-CM

## 2013-07-27 DIAGNOSIS — R5381 Other malaise: Secondary | ICD-10-CM

## 2013-07-27 DIAGNOSIS — K589 Irritable bowel syndrome without diarrhea: Secondary | ICD-10-CM

## 2013-07-27 DIAGNOSIS — Z23 Encounter for immunization: Secondary | ICD-10-CM

## 2013-07-27 LAB — COMPREHENSIVE METABOLIC PANEL
Albumin: 4.1 g/dL (ref 3.5–5.2)
CO2: 28 mEq/L (ref 19–32)
Calcium: 9.1 mg/dL (ref 8.4–10.5)
GFR: 83.75 mL/min (ref >60.00)
Glucose, Bld: 96 mg/dL (ref 70–99)
Potassium: 4 mEq/L (ref 3.5–5.1)
Sodium: 140 mEq/L (ref 135–145)
Total Protein: 6.7 g/dL (ref 6.0–8.3)

## 2013-07-27 LAB — CBC WITH DIFFERENTIAL/PLATELET
Eosinophils Relative: 3.9 % (ref 0.0–5.0)
HCT: 39 % (ref 36.0–46.0)
Lymphs Abs: 1.2 10*3/uL (ref 0.7–4.0)
Monocytes Relative: 7.9 % (ref 3.0–12.0)
Neutrophils Relative %: 67.3 % (ref 43.0–77.0)
Platelets: 192 10*3/uL (ref 150.0–400.0)
WBC: 5.8 10*3/uL (ref 4.5–10.5)

## 2013-07-27 LAB — TSH: TSH: 0.51 u[IU]/mL (ref 0.35–5.50)

## 2013-07-27 MED ORDER — DIPHENOXYLATE-ATROPINE 2.5-0.025 MG PO TABS: ORAL_TABLET | ORAL | Status: DC

## 2013-07-27 MED ORDER — ESTRADIOL 0.1 MG/GM VA CREA: 2.0000 g | TOPICAL_CREAM | VAGINAL | Status: DC | PRN

## 2013-07-27 NOTE — Assessment & Plan Note (Signed)
Symptoms of generalized fatigue. No focal symptoms. Suspect related to recent increased stressors with caring for her mother who passed away. However will check labs today including CMP, CBC, B12, TSH with labs.

## 2013-07-27 NOTE — Assessment & Plan Note (Signed)
Pt is overdue for mammogram. Will schedule.

## 2013-07-27 NOTE — Assessment & Plan Note (Signed)
BP Readings from Last 3 Encounters:  07/27/13 140/100  02/05/13 102/80  10/01/12 120/80   BP very well controlled at home, with some low readings in 90s/50s. Will continue off medication for now and continue to monitor. Follow up 3 months and prn.

## 2013-07-27 NOTE — Addendum Note (Signed)
Addended by: Theola Sequin on: 07/27/2013 08:55 AM   Modules accepted: Orders

## 2013-07-27 NOTE — Progress Notes (Signed)
Subjective:    Patient ID: Cathy Coffey, female    DOB: 05/21/1941, 72 y.o.   MRN: [id number]  HPI 72YO female with h/o IBS, HTN, hypothyroidism presents for acute visit complaining of recent worsening of IBS symptoms.   IBS - notes recent increase about 2 weeks ago in symptoms of crampy abdominal pain and watery, non-bloody diarrhea. Symptoms seemed to correlate to intake of foods, specifically salads and dairy products. Symptoms have resolved over the last few days with stopping intake of these foods and starting Fiber supplement. She also uses Lomotil prn for diarrhea. NO persistent abdominal pain, blood in stool, fever, chills. Colonoscopy is UTD.  HTN - Pt stopped taking BP medication. BP at home between 90-130s/40-60s. No chest pain, headache, dyspnea. Pt believes BP was more elevated in the past with increased stress caring for her Mother.  Fatigue - Pt continues to have some generalized fatigue. Denies any focal symptoms. Exercising by water-walking at a local gym with no dyspnea or chest pain. No weight loss.Bowel symptoms as above.  Outpatient Encounter Prescriptions as of 07/27/2013  Medication Sig Dispense Refill  . Clobetasol Prop Emollient Base 0.05 % emollient cream Apply topically 2 (two) times daily.      . diphenoxylate-atropine (LOMOTIL) 2.5-0.025 MG per tablet TAKE 2 TABLETS BY MOUTH 4 TIMES A DAY AS NEEDED FOR DIARRHEA OR LOOSE STOOL  60 tablet  4  . estradiol (ESTRACE VAGINAL) 0.1 MG/GM vaginal cream Place 0.25 Applicatorfuls vaginally as needed.  42.5 g  6  . furosemide (LASIX) 20 MG tablet Take 1 tablet (20 mg total) by mouth daily.  90 tablet  3  . levothyroxine (SYNTHROID, LEVOTHROID) 100 MCG tablet Take 1 tablet (100 mcg total) by mouth daily.  90 tablet  3   No facility-administered encounter medications on file as of 07/27/2013.   BP 140/100  Pulse 63  Temp(Src) 97.8 F (36.6 C) (Oral)  Ht 5' (1.524 m)  Wt 182 lb (82.555 kg)  BMI 35.54 kg/m2  SpO2  97%  Review of Systems  Constitutional: Positive for fatigue. Negative for fever, chills, appetite change and unexpected weight change.  HENT: Negative for ear pain, congestion, sore throat, trouble swallowing, neck pain, voice change and sinus pressure.   Eyes: Negative for visual disturbance.  Respiratory: Negative for cough, shortness of breath, wheezing and stridor.   Cardiovascular: Negative for chest pain, palpitations and leg swelling.  Gastrointestinal: Positive for abdominal pain (intermittent cramping) and diarrhea (intermittent). Negative for nausea, vomiting, constipation, blood in stool, abdominal distention and anal bleeding.  Genitourinary: Negative for dysuria and flank pain.  Musculoskeletal: Negative for myalgias, arthralgias and gait problem.  Skin: Negative for color change and rash.  Neurological: Negative for dizziness and headaches.  Hematological: Negative for adenopathy. Does not bruise/bleed easily.  Psychiatric/Behavioral: Negative for suicidal ideas, sleep disturbance and dysphoric mood. The patient is not nervous/anxious.        Objective:   Physical Exam  Constitutional: She is oriented to person, place, and time. She appears well-developed and well-nourished. No distress.  HENT:  Head: Normocephalic and atraumatic.  Right Ear: External ear normal.  Left Ear: External ear normal.  Nose: Nose normal.  Mouth/Throat: Oropharynx is clear and moist. No oropharyngeal exudate.  Eyes: Conjunctivae are normal. Pupils are equal, round, and reactive to light. Right eye exhibits no discharge. Left eye exhibits no discharge. No scleral icterus.  Neck: Normal range of motion. Neck supple. No tracheal deviation present. No thyromegaly present.  Cardiovascular: Normal  rate, regular rhythm, normal heart sounds and intact distal pulses.  Exam reveals no gallop and no friction rub.   No murmur heard. Pulmonary/Chest: Effort normal and breath sounds normal. No accessory  muscle usage. Not tachypneic. No respiratory distress. She has no decreased breath sounds. She has no wheezes. She has no rhonchi. She has no rales. She exhibits no tenderness.  Abdominal: Soft. Bowel sounds are normal. She exhibits no distension and no mass. There is no tenderness. There is no rebound and no guarding.  Musculoskeletal: Normal range of motion. She exhibits no edema and no tenderness.  Lymphadenopathy:    She has no cervical adenopathy.  Neurological: She is alert and oriented to person, place, and time. No cranial nerve deficit. She exhibits normal muscle tone. Coordination normal.  Skin: Skin is warm and dry. No rash noted. She is not diaphoretic. No erythema. No pallor.  Psychiatric: She has a normal mood and affect. Her behavior is normal. Judgment and thought content normal.          Assessment & Plan:

## 2013-07-27 NOTE — Assessment & Plan Note (Signed)
Recent exacerbation of symptoms now improved with use of increased fiber and prn Lomotil. Will continue to monitor. Colonoscopy is UTD.

## 2013-07-28 ENCOUNTER — Encounter: Payer: Self-pay | Admitting: *Deleted

## 2013-07-28 LAB — VITAMIN D 25 HYDROXY (VIT D DEFICIENCY, FRACTURES): Vit D, 25-Hydroxy: 54 ng/mL (ref 30–89)

## 2013-08-04 ENCOUNTER — Telehealth: Payer: Self-pay | Admitting: Internal Medicine

## 2013-08-04 NOTE — Telephone Encounter (Signed)
Pt states at her appt last week mammogram appt was discussed.  Advised pt she can schedule for routine mammogram.  Pt prefers [name] to schedule.  Says [facility] care center on [address]

## 2013-10-28 ENCOUNTER — Ambulatory Visit (INDEPENDENT_AMBULATORY_CARE_PROVIDER_SITE_OTHER): Payer: Medicare Other | Admitting: Internal Medicine

## 2013-10-28 ENCOUNTER — Encounter: Payer: Self-pay | Admitting: Internal Medicine

## 2013-10-28 VITALS — BP 120/90 | HR 68 | Temp 98.4°F | Ht 61.0 in | Wt 181.0 lb

## 2013-10-28 DIAGNOSIS — Z0000 Encounter for general adult medical examination without abnormal findings: Secondary | ICD-10-CM

## 2013-10-28 NOTE — Progress Notes (Signed)
Pre-visit discussion using our clinic review tool. No additional management support is needed unless otherwise documented below in the visit note.  

## 2013-10-28 NOTE — Progress Notes (Signed)
Subjective:    Patient ID: ILAMAE GENG, female    DOB: 09/01/1941, 72 y.o.   MRN: [id number]  HPI The patient is here for annual Medicare wellness examination and management of other chronic and acute problems.   The risk factors are reflected in the social history.  The roster of all physicians providing medical care to patient - is listed in the Snapshot section of the chart.  Activities of daily living:  The patient is 100% independent in all ADLs: dressing, toileting, feeding as well as independent mobility. Lives with son and granddaughter.  Home safety : The patient has smoke detectors in the home. Has alarm system. They wear seatbelts.  There are no firearms at home. There is no violence in the home.   There is no risks for hepatitis, STDs or HIV. There is history of blood transfusion in 1960s. They have no travel history to infectious disease endemic areas of the world.  The patient has seen their dentist in the last six month. (Dentist - Dr. Mathews Robinsons, will change in January because of insurance) They have seen their eye doctor in the last year. 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 Vision has declined in right eye. (Opthalmology - [facility]) No issues with hearing They have deferred audiologic testing in the last year.    They do not  have excessive sun exposure. Discussed the need for sun protection: hats, long sleeves and use of sunscreen if there is significant sun exposure. Dermatologist - none recently.  Diet: the importance of a healthy diet is discussed. They do have a healthy diet. Has lost 10lbs in last year.  The benefits of regular aerobic exercise were discussed. Limited by family responsibilities.  Depression screen: there are no signs or vegative symptoms of depression- irritability, change in appetite, anhedonia, sadness/tearfullness. Recent worsening of symptoms, with ongoing responsibilities caring for parents and granddaughter.  Cognitive  assessment: the patient manages all their financial and personal affairs and is actively engaged. They could relate day,date,year and events.  The following portions of the patient's history were reviewed and updated as appropriate: allergies, current medications, past family history, past medical history,  past surgical history, past social history  and problem list.  Visual acuity was not assessed per patient preference since she has regular follow up with her ophthalmologist. Hearing and body mass index were assessed and reviewed.   During the course of the visit the patient was educated and counseled about appropriate screening and preventive services including : fall prevention , diabetes screening, nutrition counseling, colorectal cancer screening, and recommended immunizations.    Outpatient Encounter Prescriptions as of 10/28/2013  Medication Sig  . Clobetasol Prop Emollient Base 0.05 % emollient cream Apply topically 2 (two) times daily.  . diphenoxylate-atropine (LOMOTIL) 2.5-0.025 MG per tablet TAKE 2 TABLETS BY MOUTH 4 TIMES A DAY AS NEEDED FOR DIARRHEA OR LOOSE STOOL  . estradiol (ESTRACE VAGINAL) 0.1 MG/GM vaginal cream Place 0.25 Applicatorfuls vaginally as needed.  . furosemide (LASIX) 20 MG tablet Take 1 tablet (20 mg total) by mouth daily.  Marland Kitchen levothyroxine (SYNTHROID, LEVOTHROID) 100 MCG tablet Take 1 tablet (100 mcg total) by mouth daily.   BP 120/90  Pulse 68  Temp(Src) 98.4 F (36.9 C) (Oral)  Ht 5\' 1"  (1.549 m)  Wt 181 lb (82.101 kg)  BMI 34.22 kg/m2  SpO2 97%   Review of Systems  Constitutional: Negative for fever, chills, appetite change, fatigue and unexpected weight change.  HENT:  Negative for congestion, ear pain, sinus pressure, sore throat, trouble swallowing and voice change.   Eyes: Negative for visual disturbance.  Respiratory: Negative for cough, shortness of breath, wheezing and stridor.   Cardiovascular: Negative for chest pain, palpitations and leg  swelling.  Gastrointestinal: Negative for nausea, vomiting, abdominal pain, diarrhea, constipation, blood in stool, abdominal distention and anal bleeding.  Genitourinary: Negative for dysuria and flank pain.  Musculoskeletal: Negative for arthralgias, gait problem, myalgias and neck pain.  Skin: Negative for color change and rash.  Neurological: Negative for dizziness and headaches.  Hematological: Negative for adenopathy. Does not bruise/bleed easily.  Psychiatric/Behavioral: Negative for suicidal ideas, sleep disturbance and dysphoric mood. The patient is not nervous/anxious.        Objective:   Physical Exam  Constitutional: She is oriented to person, place, and time. She appears well-developed and well-nourished. No distress.  HENT:  Head: Normocephalic and atraumatic.  Right Ear: External ear normal.  Left Ear: External ear normal.  Nose: Nose normal.  Mouth/Throat: Oropharynx is clear and moist. No oropharyngeal exudate.  Eyes: Conjunctivae are normal. Pupils are equal, round, and reactive to light. Right eye exhibits no discharge. Left eye exhibits no discharge. No scleral icterus.  Neck: Normal range of motion. Neck supple. No tracheal deviation present. No thyromegaly present.  Cardiovascular: Normal rate, regular rhythm, normal heart sounds and intact distal pulses.  Exam reveals no gallop and no friction rub.   No murmur heard. Pulmonary/Chest: Effort normal and breath sounds normal. No accessory muscle usage. Not tachypneic. No respiratory distress. She has no decreased breath sounds. She has no wheezes. She has no rales. She exhibits no tenderness. Right breast exhibits no inverted nipple, no mass, no nipple discharge, no skin change and no tenderness. Left breast exhibits no inverted nipple, no mass, no nipple discharge, no skin change and no tenderness. Breasts are symmetrical.  Abdominal: Soft. Bowel sounds are normal. She exhibits no distension and no mass. There is no  tenderness. There is no rebound and no guarding.  Musculoskeletal: Normal range of motion. She exhibits no edema and no tenderness.  Lymphadenopathy:    She has no cervical adenopathy.  Neurological: She is alert and oriented to person, place, and time. No cranial nerve deficit. She exhibits normal muscle tone. Coordination normal.  Skin: Skin is warm and dry. No rash noted. She is not diaphoretic. No erythema. No pallor.  Psychiatric: She has a normal mood and affect. Her behavior is normal. Judgment and thought content normal.          Assessment & Plan:

## 2013-10-28 NOTE — Assessment & Plan Note (Addendum)
Gen exam normal today including breast exam. PAP and pelvic deferred because of age and h/o all normal PAP.  Mammogram ordered, pt will schedule.  Reviewed recent labs from 07/2013. Follow up 6 months and prn.

## 2013-12-06 ENCOUNTER — Encounter: Payer: Self-pay | Admitting: Adult Health

## 2013-12-06 ENCOUNTER — Ambulatory Visit (INDEPENDENT_AMBULATORY_CARE_PROVIDER_SITE_OTHER): Payer: Medicare HMO | Admitting: Adult Health

## 2013-12-06 VITALS — BP 130/84 | HR 82 | Temp 98.2°F | Resp 14 | Wt 186.2 lb

## 2013-12-06 DIAGNOSIS — R142 Eructation: Secondary | ICD-10-CM

## 2013-12-06 DIAGNOSIS — R143 Flatulence: Secondary | ICD-10-CM

## 2013-12-06 DIAGNOSIS — R109 Unspecified abdominal pain: Secondary | ICD-10-CM

## 2013-12-06 DIAGNOSIS — R140 Abdominal distension (gaseous): Secondary | ICD-10-CM

## 2013-12-06 DIAGNOSIS — R141 Gas pain: Secondary | ICD-10-CM

## 2013-12-06 NOTE — Progress Notes (Signed)
Subjective:    Patient ID: Cathy Coffey, female    DOB: 17-Apr-1941, 73 y.o.   MRN: [id number]  HPI  Pt is a 73 y/o female who presents to clinic with the following concerns:  She reports feeling bloated since Christmas. 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 She reports feeling "stopped up". Last BM was today. She denies blood in her stool. Reports UTD on colonoscopy. She reports discomfort across upper quadrants. Feels her bloated feeling is pressing up and preventing her from taking deep breath. She is eating ~ 2-3 meals daily. Has been eating mainly salads for the past week. She has a hx of diverticulosis which was noted on colonoscopy.    Past Medical History  Diagnosis Date  . Asthma   . Hypothyroidism   . Hypertension   . Lichen planus      Past Surgical History  Procedure Laterality Date  . Cholecystectomy    . Hernia repair       Family History  Problem Relation Age of Onset  . Hypothyroidism Mother      History   Social History  . Marital Status: Divorced    Spouse Name: N/A    Number of Children: N/A  . Years of Education: N/A   Occupational History  . Not on file.   Social History Main Topics  . Smoking status: Never Smoker   . Smokeless tobacco: Never Used  . Alcohol Use: No  . Drug Use: No  . Sexual Activity: Not on file   Other Topics Concern  . Not on file   Social History Narrative  . No narrative on file    Current Outpatient Prescriptions on File Prior to Visit  Medication Sig Dispense Refill  . Clobetasol Prop Emollient Base 0.05 % emollient cream Apply topically 2 (two) times daily as needed.       . diphenoxylate-atropine (LOMOTIL) 2.5-0.025 MG per tablet TAKE 2 TABLETS BY MOUTH 4 TIMES A DAY AS NEEDED FOR DIARRHEA OR LOOSE STOOL  60 tablet  4  . estradiol (ESTRACE VAGINAL) 0.1  MG/GM vaginal cream Place 3.71 Applicatorfuls vaginally as needed.  42.5 g  6  . furosemide (LASIX) 20 MG tablet Take 1 tablet (20 mg total) by mouth daily.  90 tablet  3  . levothyroxine (SYNTHROID, LEVOTHROID) 100 MCG tablet Take 1 tablet (100 mcg total) by mouth daily.  90 tablet  3   No current facility-administered medications on file prior to visit.    Review of Systems  Constitutional: Positive for appetite change. Negative for fever and chills.  HENT: Negative.   Respiratory: Negative for cough, shortness of breath and wheezing.   Cardiovascular: Negative.   Gastrointestinal: Positive for abdominal pain and abdominal distention. Negative for anal bleeding.       2-3 meals daily.  Genitourinary: Negative.   Musculoskeletal: Negative.   Neurological: Negative.   Psychiatric/Behavioral: Negative.        Objective:   Physical Exam  Constitutional: She is oriented to person, place, and time.  Overweight, 73 y/o female in NAD  Cardiovascular: Normal rate and regular rhythm.   Pulmonary/Chest: Effort normal. No respiratory distress.  Abdominal: Soft. Bowel sounds are normal. She exhibits no mass. There is tenderness. There is no rebound and no guarding.  Large, rounded, soft abdomen  Neurological: She is alert and  oriented to person, place, and time.  Psychiatric: She has a normal mood and affect. Her behavior is normal. Judgment and thought content normal.    BP 130/84  Pulse 82  Temp(Src) 98.2 F (36.8 C) (Oral)  Resp 14  Wt 186 lb 4 oz (84.482 kg)  SpO2 97%       Assessment & Plan:

## 2013-12-06 NOTE — Assessment & Plan Note (Addendum)
Symptoms ongoing since Christmas. Reports discomfort across upper abdomen pressing up on her diaphragm. Feels that her bloating his preventing her from taking a deep breath. No respiratory distress observed during visit or exam. Check cbc, cmet, lipase, amylase. Send for KUB. Note greater than 30 min were spent in the assessment, evaluation, planning and implementation of care pertaining to this problem.

## 2013-12-06 NOTE — Progress Notes (Signed)
Pre-visit discussion using our clinic review tool. No additional management support is needed unless otherwise documented below in the visit note.  

## 2013-12-07 ENCOUNTER — Ambulatory Visit (INDEPENDENT_AMBULATORY_CARE_PROVIDER_SITE_OTHER)
Admission: RE | Admit: 2013-12-07 | Discharge: 2013-12-07 | Disposition: A | Discharge status: Home / Self Care | Payer: Medicare HMO | Source: Ambulatory Visit | Attending: Adult Health | Admitting: Adult Health

## 2013-12-07 ENCOUNTER — Other Ambulatory Visit: Payer: Self-pay | Admitting: Adult Health

## 2013-12-07 DIAGNOSIS — R140 Abdominal distension (gaseous): Secondary | ICD-10-CM

## 2013-12-07 DIAGNOSIS — R141 Gas pain: Secondary | ICD-10-CM

## 2013-12-07 DIAGNOSIS — R142 Eructation: Secondary | ICD-10-CM

## 2013-12-07 DIAGNOSIS — R143 Flatulence: Secondary | ICD-10-CM

## 2013-12-07 LAB — CBC WITH DIFFERENTIAL/PLATELET
BASOS PCT: 0.5 % (ref 0.0–3.0)
Basophils Absolute: 0 10*3/uL (ref 0.0–0.1)
EOS PCT: 3.9 % (ref 0.0–5.0)
Eosinophils Absolute: 0.3 10*3/uL (ref 0.0–0.7)
HEMATOCRIT: 38.5 % (ref 36.0–46.0)
HEMOGLOBIN: 13 g/dL (ref 12.0–15.0)
LYMPHS ABS: 1.5 10*3/uL (ref 0.7–4.0)
LYMPHS PCT: 23.6 % (ref 12.0–46.0)
MCHC: 33.8 g/dL (ref 30.0–36.0)
MCV: 87.6 fl (ref 78.0–100.0)
MONOS PCT: 6.3 % (ref 3.0–12.0)
Monocytes Absolute: 0.4 10*3/uL (ref 0.1–1.0)
NEUTROS ABS: 4.2 10*3/uL (ref 1.4–7.7)
Neutrophils Relative %: 65.7 % (ref 43.0–77.0)
Platelets: 185 10*3/uL (ref 150.0–400.0)
RBC: 4.39 Mil/uL (ref 3.87–5.11)
RDW: 13.9 % (ref 11.5–14.6)
WBC: 6.4 10*3/uL (ref 4.5–10.5)

## 2013-12-07 LAB — COMPREHENSIVE METABOLIC PANEL
ALT: 22 U/L (ref 0–35)
AST: 21 U/L (ref 0–37)
Albumin: 3.9 g/dL (ref 3.5–5.2)
Alkaline Phosphatase: 83 U/L (ref 39–117)
BUN: 16 mg/dL (ref 6–23)
CALCIUM: 9.2 mg/dL (ref 8.4–10.5)
CHLORIDE: 106 meq/L (ref 96–112)
CO2: 29 meq/L (ref 19–32)
CREATININE: 0.8 mg/dL (ref 0.4–1.2)
GFR: 74.2 mL/min (ref >60.00)
Glucose, Bld: 144 mg/dL — ABNORMAL HIGH (ref 70–99)
Potassium: 3.9 mEq/L (ref 3.5–5.1)
Sodium: 143 mEq/L (ref 135–145)
Total Bilirubin: 0.6 mg/dL (ref 0.3–1.2)
Total Protein: 6.9 g/dL (ref 6.0–8.3)

## 2013-12-07 LAB — LIPASE: LIPASE: 21 U/L (ref 11.0–59.0)

## 2013-12-07 LAB — AMYLASE: Amylase: 155 U/L — ABNORMAL HIGH (ref 27–131)

## 2013-12-07 MED ORDER — LACTULOSE 10 GM/15ML PO SOLN: ORAL | Status: DC

## 2013-12-09 ENCOUNTER — Telehealth: Payer: Self-pay | Admitting: *Deleted

## 2013-12-09 NOTE — Telephone Encounter (Signed)
Pt left VM, stating she has used lactulose x 3 doses, and had small BM with each dose. Has not had a large BM or any diarrhea. Still feels bloated.

## 2013-12-09 NOTE — Telephone Encounter (Signed)
Take the lactulose every 3 hours until it produces a good bowel movement.  Drink water also. The film showed there was stool in the colon without any evidence of obstruction. Her taking the lomotil just slowed her down considerably. She needs to take it until she goes.

## 2013-12-10 NOTE — Telephone Encounter (Signed)
Pt notified and verbalized understanding.

## 2013-12-15 ENCOUNTER — Other Ambulatory Visit: Payer: Self-pay | Admitting: *Deleted

## 2013-12-15 MED ORDER — LEVOTHYROXINE SODIUM 100 MCG PO TABS: 100.0000 ug | ORAL_TABLET | Freq: Every day | ORAL | Status: DC

## 2013-12-15 MED ORDER — FUROSEMIDE 20 MG PO TABS: 20.0000 mg | ORAL_TABLET | Freq: Every day | ORAL | Status: DC

## 2013-12-22 ENCOUNTER — Telehealth: Payer: Self-pay | Admitting: Emergency Medicine

## 2013-12-22 NOTE — Telephone Encounter (Signed)
Patient has been approved to see Dr. Burman Blacksmith with 4 visits exp 03/19/14. auth # [id number]

## 2013-12-27 ENCOUNTER — Telehealth: Payer: Self-pay | Admitting: Internal Medicine

## 2013-12-27 ENCOUNTER — Telehealth: Payer: Self-pay | Admitting: Emergency Medicine

## 2013-12-27 NOTE — Telephone Encounter (Signed)
[facility] called they need more visits for the patient. Referral underway for [facility]

## 2013-12-27 NOTE — Telephone Encounter (Signed)
Need to set up visit for evaluation.

## 2013-12-27 NOTE — Telephone Encounter (Signed)
Patient confirmed appointment for Friday at 1130

## 2013-12-27 NOTE — Telephone Encounter (Signed)
Pt states she has had a sinus infection for 16 days.  States she had surgery on her nose a few years ago that is causing some issues.  Pt is not able to cough up the phlegm or blow it out.  States it is so thick she has to use a [employer].  Pt is asking if Dr. Gilford Rile is able to see her for this or if she will have to be referred to a nose doctor due to her previous surgery.  States her nose hurts really bad around the flap where her surgery was.

## 2013-12-27 NOTE — Telephone Encounter (Signed)
Please read below and advise.

## 2013-12-30 NOTE — Telephone Encounter (Signed)
Pt approved to see [facility] with 4 visits exp 03/26/14. auth # [id number]

## 2013-12-31 ENCOUNTER — Ambulatory Visit: Payer: Medicare HMO | Admitting: Internal Medicine

## 2014-01-10 ENCOUNTER — Ambulatory Visit (INDEPENDENT_AMBULATORY_CARE_PROVIDER_SITE_OTHER): Payer: Medicare HMO | Admitting: Internal Medicine

## 2014-01-10 ENCOUNTER — Encounter: Payer: Self-pay | Admitting: Internal Medicine

## 2014-01-10 VITALS — BP 130/78 | HR 89 | Temp 98.1°F | Wt 186.0 lb

## 2014-01-10 DIAGNOSIS — J209 Acute bronchitis, unspecified: Secondary | ICD-10-CM

## 2014-01-10 MED ORDER — HYDROCODONE-HOMATROPINE 5-1.5 MG/5ML PO SYRP: 5.0000 mL | ORAL_SOLUTION | Freq: Three times a day (TID) | ORAL | Status: DC | PRN

## 2014-01-10 MED ORDER — AZITHROMYCIN 250 MG PO TABS: ORAL_TABLET | ORAL | Status: DC

## 2014-01-10 NOTE — Patient Instructions (Addendum)
Acute Bronchitis Bronchitis is inflammation of the airways that extend from the windpipe into the lungs (bronchi). The inflammation often causes mucus to develop. This leads to a cough, which is the most common symptom of bronchitis.  In acute bronchitis, the condition usually develops suddenly and goes away over time, usually in a couple weeks. Smoking, allergies, and asthma can make bronchitis worse. Repeated episodes of bronchitis may cause further lung problems.  CAUSES Acute bronchitis is most often caused by the same virus that causes a cold. The virus can spread from person to person (contagious).  SIGNS AND SYMPTOMS   Cough.   Fever.   Coughing up mucus.   Body aches.   Chest congestion.   Chills.   Shortness of breath.   Sore throat.  DIAGNOSIS  Acute bronchitis is usually diagnosed through a physical exam. Tests, such as chest X-rays, are sometimes done to rule out other conditions.  TREATMENT  Acute bronchitis usually goes away in a couple weeks. Often times, no medical treatment is necessary. Medicines are sometimes given for relief of fever or cough. Antibiotics are usually not needed but may be prescribed in certain situations. In some cases, an inhaler may be recommended to help reduce shortness of breath and control the cough. A cool mist vaporizer may also be used to help thin bronchial secretions and make it easier to clear the chest.  HOME CARE INSTRUCTIONS  Get plenty of rest.   Drink enough fluids to keep your urine clear or pale yellow (unless you have a medical condition that requires fluid restriction). Increasing fluids may help thin your secretions and will prevent dehydration.   Only take over-the-counter or prescription medicines as directed by your health care provider.   Avoid smoking and secondhand smoke. Exposure to cigarette smoke or irritating chemicals will make bronchitis worse. If you are a smoker, consider using nicotine gum or skin  patches to help control withdrawal symptoms. Quitting smoking will help your lungs heal faster.   Reduce the chances of another bout of acute bronchitis by washing your hands frequently, avoiding people with cold symptoms, and trying not to touch your hands to your mouth, nose, or eyes.   Follow up with your health care provider as directed.  SEEK MEDICAL CARE IF: Your symptoms do not improve after 1 week of treatment.  SEEK IMMEDIATE MEDICAL CARE IF:  You develop an increased fever or chills.   You have chest pain.   You have severe shortness of breath.  You have bloody sputum.   You develop dehydration.  You develop fainting.  You develop repeated vomiting.  You develop a severe headache. MAKE SURE YOU:   Understand these instructions.  Will watch your condition.  Will get help right away if you are not doing well or get worse. Document Released: 12/05/2004 Document Revised: 06/30/2013 Document Reviewed: 04/20/2013 [employer] Patient Information 2014 [employer].  

## 2014-01-10 NOTE — Progress Notes (Signed)
HPI  Pt presents to the clinic today with c/o cold symptoms. She reports this started 3-4 weeks ago. She is coughing, wheezing and has a headache. The cough is productive of thick green mucous. She denies fever but has had chills and body aches. She has used a neti pot, salt water gargles, Claritin D and Mucinex without much relief. She has had sick contacts. She does have a history of bronchitis.  Review of Systems      Past Medical History  Diagnosis Date  . Asthma   . Hypothyroidism   . Hypertension   . Lichen planus     Family History  Problem Relation Age of Onset  . Hypothyroidism Mother     History   Social History  . Marital Status: Divorced    Spouse Name: N/A    Number of Children: N/A  . Years of Education: N/A   Occupational History  . Not on file.   Social History Main Topics  . Smoking status: Never Smoker   . Smokeless tobacco: Never Used  . Alcohol Use: No  . Drug Use: No  . Sexual Activity: Not on file   Other Topics Concern  . Not on file   Social History Narrative  . No narrative on file    No Known Allergies   Constitutional: Positive headache, fatigue and fever. Denies abrupt weight changes.  HEENT:  Positive sore throat. Denies eye redness, eye pain, pressure behind the eyes, facial pain, nasal congestion, ear pain, ringing in the ears, wax buildup, runny nose or bloody nose. Respiratory: Positive cough. Denies difficulty breathing or shortness of breath.  Cardiovascular: Denies chest pain, chest tightness, palpitations or swelling in the hands or feet.   No other specific complaints in a complete review of systems (except as listed in HPI above).  Objective:   BP 130/78  Pulse 89  Temp(Src) 98.1 F (36.7 C) (Oral)  Wt 186 lb (84.369 kg)  SpO2 99% Wt Readings from Last 3 Encounters:  01/10/14 186 lb (84.369 kg)  12/06/13 186 lb 4 oz (84.482 kg)  10/28/13 181 lb (82.101 kg)     General: Appears her stated age, well developed,  well nourished in NAD. HEENT: Head: normal shape and size; Eyes: sclera white, no icterus, conjunctiva pink, PERRLA and EOMs intact; Ears: Tm's gray and intact, normal light reflex; Nose: mucosa pink and moist, septum midline; Throat/Mouth: + PND. Teeth present, mucosa erythematous and moist, no exudate noted, no lesions or ulcerations noted.  Neck: Mild cervical lymphadenopathy. Neck supple, trachea midline. No massses, lumps or thyromegaly present.  Cardiovascular: Normal rate and rhythm. S1,S2 noted.  No murmur, rubs or gallops noted. No JVD or BLE edema. No carotid bruits noted. Pulmonary/Chest: Normal effort and rhonchi note in the RUL. No respiratory distress. No wheezes, rales noted.      Assessment & Plan:   Acute Bronchitis:  Get some rest and drink plenty of water Do salt water gargles for the sore throat eRx for Azithromax x 5 days eRx for Hycodan cough syrup  RTC as needed or if symptoms persist.

## 2014-01-10 NOTE — Progress Notes (Signed)
Pre visit review using our clinic review tool, if applicable. No additional management support is needed unless otherwise documented below in the visit note. 

## 2014-01-12 NOTE — Telephone Encounter (Signed)
Pt has been approved for 4 more visits with [facility], exp 04/12/14. Auth # [id number]

## 2014-04-22 ENCOUNTER — Encounter: Payer: Self-pay | Admitting: Internal Medicine

## 2014-04-22 ENCOUNTER — Ambulatory Visit (INDEPENDENT_AMBULATORY_CARE_PROVIDER_SITE_OTHER): Payer: Medicare HMO | Admitting: Internal Medicine

## 2014-04-22 VITALS — BP 118/76 | HR 76 | Temp 98.3°F | Ht 61.0 in | Wt 186.2 lb

## 2014-04-22 DIAGNOSIS — N951 Menopausal and female climacteric states: Secondary | ICD-10-CM

## 2014-04-22 DIAGNOSIS — R232 Flushing: Secondary | ICD-10-CM

## 2014-04-22 DIAGNOSIS — R1013 Epigastric pain: Secondary | ICD-10-CM

## 2014-04-22 LAB — COMPREHENSIVE METABOLIC PANEL
ALK PHOS: 78 U/L (ref 39–117)
ALT: 20 U/L (ref 0–35)
AST: 24 U/L (ref 0–37)
Albumin: 3.8 g/dL (ref 3.5–5.2)
BUN: 17 mg/dL (ref 6–23)
CO2: 27 mEq/L (ref 19–32)
Calcium: 9.6 mg/dL (ref 8.4–10.5)
Chloride: 105 mEq/L (ref 96–112)
Creatinine, Ser: 0.9 mg/dL (ref 0.4–1.2)
GFR: 68.26 mL/min (ref >60.00)
Glucose, Bld: 103 mg/dL — ABNORMAL HIGH (ref 70–99)
Potassium: 3.9 mEq/L (ref 3.5–5.1)
SODIUM: 140 meq/L (ref 135–145)
TOTAL PROTEIN: 6.6 g/dL (ref 6.0–8.3)
Total Bilirubin: 0.3 mg/dL (ref 0.2–1.2)

## 2014-04-22 LAB — CBC WITH DIFFERENTIAL/PLATELET
BASOS ABS: 0 10*3/uL (ref 0.0–0.1)
Basophils Relative: 0.5 % (ref 0.0–3.0)
EOS ABS: 1.3 10*3/uL — AB (ref 0.0–0.7)
Eosinophils Relative: 18.7 % — ABNORMAL HIGH (ref 0.0–5.0)
HEMATOCRIT: 41 % (ref 36.0–46.0)
Hemoglobin: 13.6 g/dL (ref 12.0–15.0)
Lymphocytes Relative: 21.4 % (ref 12.0–46.0)
Lymphs Abs: 1.5 10*3/uL (ref 0.7–4.0)
MCHC: 33.1 g/dL (ref 30.0–36.0)
MCV: 90.2 fl (ref 78.0–100.0)
MONO ABS: 0.5 10*3/uL (ref 0.1–1.0)
MONOS PCT: 6.7 % (ref 3.0–12.0)
Neutro Abs: 3.7 10*3/uL (ref 1.4–7.7)
Neutrophils Relative %: 52.7 % (ref 43.0–77.0)
PLATELETS: 195 10*3/uL (ref 150.0–400.0)
RBC: 4.54 Mil/uL (ref 3.87–5.11)
RDW: 14.1 % (ref 11.5–15.5)
WBC: 7 10*3/uL (ref 4.0–10.5)

## 2014-04-22 LAB — VITAMIN D 25 HYDROXY (VIT D DEFICIENCY, FRACTURES): VITD: 28.46 ng/mL

## 2014-04-22 LAB — TSH: TSH: 0.23 u[IU]/mL — ABNORMAL LOW (ref 0.35–4.50)

## 2014-04-22 MED ORDER — PANTOPRAZOLE SODIUM 40 MG PO TBEC: 40.0000 mg | DELAYED_RELEASE_TABLET | Freq: Two times a day (BID) | ORAL | Status: DC

## 2014-04-22 NOTE — Patient Instructions (Addendum)
Please stop Vit D and Vit A supplements until labs back.  Start Pantoprazole 40mg  twice daily to help with abdominal pain.  We will set up GI evaluation.

## 2014-04-22 NOTE — Progress Notes (Signed)
Pre visit review using our clinic review tool, if applicable. No additional management support is needed unless otherwise documented below in the visit note. 

## 2014-04-22 NOTE — Assessment & Plan Note (Signed)
Likely related to menopause, however will check TSH with labs.

## 2014-04-22 NOTE — Assessment & Plan Note (Signed)
Symptoms concerning for gastritis versus ulcer. Will start Pantoprazole 40mg  po bid. Will check H. Pylori breath test today. Will set up GI evaluation for possible endoscopy. Check CBC, CMP with labs today. Follow up 2 weeks and prn.

## 2014-04-22 NOTE — Progress Notes (Signed)
Subjective:    Patient ID: Cathy Coffey, female    DOB: 04/05/1941, 73 y.o.   MRN: [id number]  HPI 73YO female presents for acute visit.  Abdominal pain -  Historically, has had chronic watery diarrhea. However this seemed to improve in Dec 2014, with some constipation. January this year, began to feel more bloated.  About 1 week ago, developed diffuse abdominal pain, most pronounced in the upper abdomen. Started Magnesium which improved constipation, but no improvement in abdominal pain. Notes some bloating after eating. Pain described as "ulcer like."  Stools have been darker since starting iron supplementation, but no frank blood seen.  Feeling tired over the last month. No focal symptoms such as chest pain. Has some chronic mild dyspnea such as when exercising at the gym. Appetite is good. She notes some hot flashes which occur both at night and during the day and affect her quality of life.   Review of Systems  Constitutional: Positive for fatigue. Negative for fever, chills, appetite change and unexpected weight change.  HENT: Negative for congestion, ear pain, sinus pressure, sore throat, trouble swallowing and voice change.   Eyes: Negative for visual disturbance.  Respiratory: Negative for cough, shortness of breath, wheezing and stridor.   Cardiovascular: Negative for chest pain, palpitations and leg swelling.  Gastrointestinal: Positive for abdominal pain, diarrhea (improved recently) and abdominal distention. Negative for nausea, vomiting, constipation, blood in stool and anal bleeding.  Genitourinary: Negative for dysuria and flank pain.  Musculoskeletal: Negative for arthralgias, gait problem, myalgias and neck pain.  Skin: Negative for color change and rash.  Neurological: Negative for dizziness and headaches.  Hematological: Negative for adenopathy. Does not bruise/bleed easily.  Psychiatric/Behavioral: Negative for suicidal ideas, sleep disturbance and dysphoric mood.  The patient is not nervous/anxious.        Objective:    BP 118/76  Pulse 76  Temp(Src) 98.3 F (36.8 C) (Oral)  Ht 5\' 1"  (1.549 m)  Wt 186 lb 4 oz (84.482 kg)  BMI 35.21 kg/m2  SpO2 96% Physical Exam  Constitutional: She is oriented to person, place, and time. She appears well-developed and well-nourished. No distress.  HENT:  Head: Normocephalic and atraumatic.  Right Ear: External ear normal.  Left Ear: External ear normal.  Nose: Nose normal.  Mouth/Throat: Oropharynx is clear and moist. No oropharyngeal exudate.  Eyes: Conjunctivae are normal. Pupils are equal, round, and reactive to light. Right eye exhibits no discharge. Left eye exhibits no discharge. No scleral icterus.  Neck: Normal range of motion. Neck supple. No tracheal deviation present. No thyromegaly present.  Cardiovascular: Normal rate, regular rhythm, normal heart sounds and intact distal pulses.  Exam reveals no gallop and no friction rub.   No murmur heard. Pulmonary/Chest: Effort normal and breath sounds normal. No accessory muscle usage. Not tachypneic. No respiratory distress. She has no decreased breath sounds. She has no wheezes. She has no rhonchi. She has no rales. She exhibits no tenderness.  Abdominal: Soft. Bowel sounds are normal. She exhibits no distension and no mass. There is tenderness (epigastric area). There is no rebound and no guarding.  Musculoskeletal: Normal range of motion. She exhibits no edema and no tenderness.  Lymphadenopathy:    She has no cervical adenopathy.  Neurological: She is alert and oriented to person, place, and time. No cranial nerve deficit. She exhibits normal muscle tone. Coordination normal.  Skin: Skin is warm and dry. No rash noted. She is not diaphoretic. No erythema. No pallor.  Psychiatric: She has a normal mood and affect. Her behavior is normal. Judgment and thought content normal.          Assessment & Plan:   Problem List Items Addressed This Visit      Unprioritized   Abdominal pain, epigastric - Primary     Symptoms concerning for gastritis versus ulcer. Will start Pantoprazole 40mg  po bid. Will check H. Pylori breath test today. Will set up GI evaluation for possible endoscopy. Check CBC, CMP with labs today. Follow up 2 weeks and prn.    Relevant Medications      pantoprazole (PROTONIX) EC tablet   Other Relevant Orders      Comprehensive metabolic panel      Vit D  25 hydroxy (rtn osteoporosis monitoring)      CBC with Differential      H. pylori breath test      Ambulatory referral to Gastroenterology   Hot flashes     Likely related to menopause, however will check TSH with labs.    Relevant Orders      TSH       Return in about 2 weeks (around 05/06/2014).

## 2014-04-25 LAB — H. PYLORI BREATH TEST: H. PYLORI BREATH TEST: NOT DETECTED

## 2014-04-29 ENCOUNTER — Encounter: Payer: Self-pay | Admitting: Internal Medicine

## 2014-04-29 ENCOUNTER — Ambulatory Visit (INDEPENDENT_AMBULATORY_CARE_PROVIDER_SITE_OTHER): Payer: Medicare HMO | Admitting: Internal Medicine

## 2014-04-29 VITALS — BP 120/86 | HR 74 | Resp 16 | Ht 61.0 in | Wt 187.8 lb

## 2014-04-29 DIAGNOSIS — R1013 Epigastric pain: Secondary | ICD-10-CM

## 2014-04-29 DIAGNOSIS — E038 Other specified hypothyroidism: Secondary | ICD-10-CM

## 2014-04-29 DIAGNOSIS — L259 Unspecified contact dermatitis, unspecified cause: Secondary | ICD-10-CM

## 2014-04-29 LAB — T4, FREE: FREE T4: 1.09 ng/dL (ref 0.60–1.60)

## 2014-04-29 MED ORDER — DESONIDE 0.05 % EX CREA: TOPICAL_CREAM | Freq: Two times a day (BID) | CUTANEOUS | Status: DC

## 2014-04-29 NOTE — Progress Notes (Signed)
Pre visit review using our clinic review tool, if applicable. No additional management support is needed unless otherwise documented below in the visit note. 

## 2014-04-29 NOTE — Assessment & Plan Note (Signed)
Recent TSH oversuppressed. Discussed lowering dose of Levothyroxine, however given fatigue, will check Free T4 first.

## 2014-04-29 NOTE — Assessment & Plan Note (Signed)
Symptoms and exam consistent with contact dermatitis. Will start topical Desonide cream bid. Follow up prn.

## 2014-04-29 NOTE — Assessment & Plan Note (Signed)
Persistent epigastric abdominal pain and diffuse bloating. No improvement with addition of pantoprazole. GI evaluation is scheduled in August 2015. Lab evaluation including CMP, Lipase, and testing for H. Pylori was negative.  Will get CT abdomen for further evaluation. Question ovarian pathology given persistent diffuse bloating.

## 2014-04-29 NOTE — Progress Notes (Signed)
Subjective:    Patient ID: Cathy Coffey, female    DOB: 03-03-1941, 73 y.o.   MRN: [id number]  HPI 72YO female presents for follow up.  Abdominal pain - At last visit, c/o epigastric pain and bloating. Started on Pantoprazole. H. Pylori breath test was negative. No improvement in abdominal pain. Continues to feel bloated, especially after eating. No nausea. No gross blood in the stool.  Rash - First noted on legs about 1 week ago after working out in yard. Now spread over arms. Itchy. No fever or chills. No improvement with OTC topical hydrocortisone.  Review of Systems  Constitutional: Negative for fever, chills, appetite change, fatigue and unexpected weight change.  Eyes: Negative for visual disturbance.  Respiratory: Negative for shortness of breath.   Cardiovascular: Negative for chest pain and leg swelling.  Gastrointestinal: Positive for abdominal pain and abdominal distention. Negative for nausea, vomiting, diarrhea, constipation, blood in stool, anal bleeding and rectal pain.  Skin: Positive for color change and rash.  Hematological: Negative for adenopathy. Does not bruise/bleed easily.  Psychiatric/Behavioral: Negative for dysphoric mood. The patient is not nervous/anxious.        Objective:    BP 120/86  Pulse 74  Resp 16  Ht 5\' 1"  (1.549 m)  Wt 187 lb 12 oz (85.163 kg)  BMI 35.49 kg/m2  SpO2 96% Physical Exam  Constitutional: She is oriented to person, place, and time. She appears well-developed and well-nourished. No distress.  HENT:  Head: Normocephalic and atraumatic.  Right Ear: External ear normal.  Left Ear: External ear normal.  Nose: Nose normal.  Mouth/Throat: Oropharynx is clear and moist. No oropharyngeal exudate.  Eyes: Conjunctivae are normal. Pupils are equal, round, and reactive to light. Right eye exhibits no discharge. Left eye exhibits no discharge. No scleral icterus.  Neck: Normal range of motion. Neck supple. No tracheal deviation  present. No thyromegaly present.  Cardiovascular: Normal rate, regular rhythm, normal heart sounds and intact distal pulses.  Exam reveals no gallop and no friction rub.   No murmur heard. Pulmonary/Chest: Effort normal and breath sounds normal. No accessory muscle usage. Not tachypneic. No respiratory distress. She has no decreased breath sounds. She has no wheezes. She has no rhonchi. She has no rales. She exhibits no tenderness.  Abdominal: Soft. Bowel sounds are normal. She exhibits no distension and no mass. There is no tenderness. There is no rebound and no guarding.  Musculoskeletal: Normal range of motion. She exhibits no edema and no tenderness.  Lymphadenopathy:    She has no cervical adenopathy.  Neurological: She is alert and oriented to person, place, and time. No cranial nerve deficit. She exhibits normal muscle tone. Coordination normal.  Skin: Skin is warm and dry. No rash noted. She is not diaphoretic. No erythema. No pallor.  Psychiatric: She has a normal mood and affect. Her behavior is normal. Judgment and thought content normal.          Assessment & Plan:   Problem List Items Addressed This Visit     Unprioritized   Abdominal pain, epigastric - Primary     Persistent epigastric abdominal pain and diffuse bloating. No improvement with addition of pantoprazole. GI evaluation is scheduled in August 2015. Lab evaluation including CMP, Lipase, and testing for H. Pylori was negative.  Will get CT abdomen for further evaluation. Question ovarian pathology given persistent diffuse bloating.    Relevant Orders      CT Abdomen Pelvis W Contrast   Contact  dermatitis     Symptoms and exam consistent with contact dermatitis. Will start topical Desonide cream bid. Follow up prn.    Relevant Medications      desonide (DESOWEN) 0.05 % cream   Hypothyroidism     Recent TSH oversuppressed. Discussed lowering dose of Levothyroxine, however given fatigue, will check Free T4  first.    Relevant Orders      T4, free       Return in about 2 weeks (around 05/13/2014).

## 2014-05-05 ENCOUNTER — Ambulatory Visit: Payer: Self-pay | Admitting: Internal Medicine

## 2014-05-06 ENCOUNTER — Telehealth: Payer: Self-pay | Admitting: Internal Medicine

## 2014-05-06 ENCOUNTER — Ambulatory Visit: Payer: Medicare HMO | Admitting: Internal Medicine

## 2014-05-06 NOTE — Telephone Encounter (Signed)
CT abdomen showed no acute abnormalities. There was noted to be stool throughout the colon, consistent with constipation. They also noted diverticulosis, but no inflammation (diverticulitis).

## 2014-05-06 NOTE — Telephone Encounter (Signed)
Notified pt. 

## 2014-05-25 ENCOUNTER — Encounter: Payer: Self-pay | Admitting: Internal Medicine

## 2014-05-30 ENCOUNTER — Ambulatory Visit: Payer: Medicare HMO | Admitting: Internal Medicine

## 2014-06-10 ENCOUNTER — Other Ambulatory Visit: Payer: Self-pay | Admitting: Internal Medicine

## 2014-06-27 ENCOUNTER — Encounter: Payer: Self-pay | Admitting: Internal Medicine

## 2014-06-28 ENCOUNTER — Ambulatory Visit: Payer: Medicare HMO | Admitting: Internal Medicine

## 2014-06-29 ENCOUNTER — Ambulatory Visit: Payer: Medicare HMO | Admitting: Internal Medicine

## 2014-06-30 ENCOUNTER — Ambulatory Visit: Payer: Medicare HMO | Admitting: Internal Medicine

## 2014-06-30 DIAGNOSIS — Z0289 Encounter for other administrative examinations: Secondary | ICD-10-CM

## 2014-08-11 ENCOUNTER — Telehealth: Payer: Self-pay | Admitting: Internal Medicine

## 2014-08-11 NOTE — Telephone Encounter (Signed)
Pt called to request for a dermatologist. Pt stated that she didn't want to wait months to get the referral. Please advise pt.msn

## 2014-08-11 NOTE — Telephone Encounter (Signed)
Please advise 

## 2014-08-12 NOTE — Telephone Encounter (Signed)
Call pt and scheduled an appt

## 2014-08-12 NOTE — Telephone Encounter (Signed)
Needs to be seen so we can document reason for referral.

## 2014-08-16 ENCOUNTER — Ambulatory Visit (INDEPENDENT_AMBULATORY_CARE_PROVIDER_SITE_OTHER): Payer: Medicare HMO | Admitting: Internal Medicine

## 2014-08-16 ENCOUNTER — Encounter: Payer: Self-pay | Admitting: Internal Medicine

## 2014-08-16 VITALS — BP 124/80 | HR 71 | Temp 98.2°F | Ht 61.0 in | Wt 190.2 lb

## 2014-08-16 DIAGNOSIS — L989 Disorder of the skin and subcutaneous tissue, unspecified: Secondary | ICD-10-CM | POA: Insufficient documentation

## 2014-08-16 MED ORDER — LEVOTHYROXINE SODIUM 100 MCG PO TABS: 100.0000 ug | ORAL_TABLET | Freq: Every day | ORAL | Status: DC

## 2014-08-16 NOTE — Assessment & Plan Note (Signed)
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 Will set up dermatology evaluation.

## 2014-08-16 NOTE — Progress Notes (Signed)
Pre visit review using our clinic review tool, if applicable. No additional management support is needed unless otherwise documented below in the visit note. 

## 2014-08-16 NOTE — Progress Notes (Signed)
   Subjective:    Patient ID: Cathy Coffey, female    DOB: Nov 27, 1940, 73 y.o.   MRN: [id number]  HPI 73YO female presents for acute visit.  Spot on face left medial cheek. Present for approximately 1-2 months. Red with central brown area. No painful or itchy. Not using anything for this.  Review of Systems  Constitutional: Negative for fever, chills, appetite change, fatigue and unexpected weight change.  Eyes: Negative for visual disturbance.  Respiratory: Negative for shortness of breath.   Cardiovascular: Negative for chest pain and leg swelling.  Gastrointestinal: Negative for abdominal pain.  Skin: Positive for color change. Negative for rash.  Hematological: Negative for adenopathy. Does not bruise/bleed easily.  Psychiatric/Behavioral: Negative for dysphoric mood. The patient is not nervous/anxious.        Objective:    BP 124/80  Pulse 71  Temp(Src) 98.2 F (36.8 C) (Oral)  Ht 5\' 1"  (1.549 m)  Wt 190 lb 4 oz (86.297 kg)  BMI 35.97 kg/m2  SpO2 96% Physical Exam  Constitutional: She is oriented to person, place, and time. She appears well-developed and well-nourished. No distress.  HENT:  Head: Normocephalic and atraumatic.  Right Ear: External ear normal.  Left Ear: External ear normal.  Nose: Nose normal.  Mouth/Throat: Oropharynx is clear and moist. No oropharyngeal exudate.  Eyes: Conjunctivae are normal. Pupils are equal, round, and reactive to light. Right eye exhibits no discharge. Left eye exhibits no discharge. No scleral icterus.  Neck: Normal range of motion. Neck supple. No tracheal deviation present. No thyromegaly present.  Cardiovascular: Normal rate, regular rhythm, normal heart sounds and intact distal pulses.  Exam reveals no gallop and no friction rub.   No murmur heard. Pulmonary/Chest: Effort normal and breath sounds normal. No accessory muscle usage. Not tachypneic. No respiratory distress. She has no decreased breath sounds. She has no  wheezes. She has no rhonchi. She has no rales. She exhibits no tenderness.  Musculoskeletal: Normal range of motion. She exhibits no edema and no tenderness.  Lymphadenopathy:    She has no cervical adenopathy.  Neurological: She is alert and oriented to person, place, and time. No cranial nerve deficit. She exhibits normal muscle tone. Coordination normal.  Skin: Skin is warm and dry. Lesion noted. No rash noted. She is not diaphoretic. No erythema. No pallor.     Psychiatric: She has a normal mood and affect. Her behavior is normal. Judgment and thought content normal.          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 Will set up dermatology evaluation.    Relevant Orders      Ambulatory referral to Dermatology       Return if symptoms worsen or fail to improve.

## 2014-08-16 NOTE — Patient Instructions (Signed)
We will set up an evaluation with Dermatology.  Follow up for your Wellness Visit in 10/2014.

## 2014-08-30 ENCOUNTER — Ambulatory Visit: Payer: Medicare HMO | Admitting: Internal Medicine

## 2014-09-26 ENCOUNTER — Telehealth: Payer: Self-pay | Admitting: *Deleted

## 2014-09-26 DIAGNOSIS — Z0100 Encounter for examination of eyes and vision without abnormal findings: Secondary | ICD-10-CM

## 2014-09-26 NOTE — Telephone Encounter (Signed)
OK. Referral placed.

## 2014-09-26 NOTE — Telephone Encounter (Signed)
Pt needs referral placed for Dr. Jomarie Longs for eye exam referral. She has already scheduled appt 10/11/14. Just changing doctors from [place].

## 2014-10-28 ENCOUNTER — Telehealth: Payer: Self-pay | Admitting: *Deleted

## 2014-10-28 NOTE — Telephone Encounter (Signed)
Please ask her if she can come in now.

## 2014-10-28 NOTE — Telephone Encounter (Signed)
She stated she spoke with the nurse a [facility] and they gave her some advice and she is doing that and feeling a little bit better and chooses not to come in for an appointment.

## 2014-10-28 NOTE — Telephone Encounter (Signed)
[place]:  10/26/14  9:37 am -- Caller states 333% certain getting bronchitis; how to stop; doesn't want to be sick at Christmas; (dad is 101 and staying with him every other day)   Cough - Acute Productive - cough with cold symptoms

## 2014-11-02 ENCOUNTER — Encounter: Payer: Self-pay | Admitting: Internal Medicine

## 2014-11-02 ENCOUNTER — Ambulatory Visit (INDEPENDENT_AMBULATORY_CARE_PROVIDER_SITE_OTHER): Payer: Medicare HMO | Admitting: Internal Medicine

## 2014-11-02 VITALS — BP 103/72 | HR 70 | Temp 98.1°F | Ht 61.5 in | Wt 188.2 lb

## 2014-11-02 DIAGNOSIS — E039 Hypothyroidism, unspecified: Secondary | ICD-10-CM

## 2014-11-02 DIAGNOSIS — Z0000 Encounter for general adult medical examination without abnormal findings: Secondary | ICD-10-CM

## 2014-11-02 DIAGNOSIS — I10 Essential (primary) hypertension: Secondary | ICD-10-CM

## 2014-11-02 DIAGNOSIS — R1013 Epigastric pain: Secondary | ICD-10-CM

## 2014-11-02 DIAGNOSIS — Z1239 Encounter for other screening for malignant neoplasm of breast: Secondary | ICD-10-CM

## 2014-11-02 DIAGNOSIS — Z23 Encounter for immunization: Secondary | ICD-10-CM

## 2014-11-02 LAB — COMPREHENSIVE METABOLIC PANEL
ALBUMIN: 4.1 g/dL (ref 3.5–5.2)
ALT: 20 U/L (ref 0–35)
AST: 21 U/L (ref 0–37)
Alkaline Phosphatase: 85 U/L (ref 39–117)
BILIRUBIN TOTAL: 0.3 mg/dL (ref 0.2–1.2)
BUN: 17 mg/dL (ref 6–23)
CO2: 26 meq/L (ref 19–32)
Calcium: 9.1 mg/dL (ref 8.4–10.5)
Chloride: 103 mEq/L (ref 96–112)
Creatinine, Ser: 1 mg/dL (ref 0.4–1.2)
GFR: 60.83 mL/min (ref >60.00)
Glucose, Bld: 94 mg/dL (ref 70–99)
Potassium: 4 mEq/L (ref 3.5–5.1)
SODIUM: 139 meq/L (ref 135–145)
TOTAL PROTEIN: 7.2 g/dL (ref 6.0–8.3)

## 2014-11-02 LAB — LIPID PANEL
Cholesterol: 203 mg/dL — ABNORMAL HIGH (ref 0–200)
HDL: 43 mg/dL (ref >39.00)
LDL Cholesterol: 136 mg/dL — ABNORMAL HIGH (ref 0–99)
NonHDL: 160
TRIGLYCERIDES: 118 mg/dL (ref 0.0–149.0)
Total CHOL/HDL Ratio: 5
VLDL: 23.6 mg/dL (ref 0.0–40.0)

## 2014-11-02 LAB — CBC WITH DIFFERENTIAL/PLATELET
Basophils Absolute: 0 10*3/uL (ref 0.0–0.1)
Basophils Relative: 0.6 % (ref 0.0–3.0)
EOS PCT: 14.3 % — AB (ref 0.0–5.0)
Eosinophils Absolute: 1 10*3/uL — ABNORMAL HIGH (ref 0.0–0.7)
HCT: 40.6 % (ref 36.0–46.0)
HEMOGLOBIN: 13.5 g/dL (ref 12.0–15.0)
Lymphocytes Relative: 21.4 % (ref 12.0–46.0)
Lymphs Abs: 1.5 10*3/uL (ref 0.7–4.0)
MCHC: 33.3 g/dL (ref 30.0–36.0)
MCV: 88.8 fl (ref 78.0–100.0)
MONOS PCT: 7.5 % (ref 3.0–12.0)
Monocytes Absolute: 0.5 10*3/uL (ref 0.1–1.0)
Neutro Abs: 3.8 10*3/uL (ref 1.4–7.7)
Neutrophils Relative %: 56.2 % (ref 43.0–77.0)
PLATELETS: 210 10*3/uL (ref 150.0–400.0)
RBC: 4.57 Mil/uL (ref 3.87–5.11)
RDW: 13.9 % (ref 11.5–15.5)
WBC: 6.8 10*3/uL (ref 4.0–10.5)

## 2014-11-02 LAB — VITAMIN D 25 HYDROXY (VIT D DEFICIENCY, FRACTURES): VITD: 32.87 ng/mL (ref 30.00–100.00)

## 2014-11-02 LAB — HM MAMMOGRAPHY

## 2014-11-02 MED ORDER — FUROSEMIDE 20 MG PO TABS: 20.0000 mg | ORAL_TABLET | Freq: Every day | ORAL | Status: DC

## 2014-11-02 MED ORDER — PANTOPRAZOLE SODIUM 40 MG PO TBEC: 40.0000 mg | DELAYED_RELEASE_TABLET | Freq: Two times a day (BID) | ORAL | Status: DC

## 2014-11-02 MED ORDER — LEVOTHYROXINE SODIUM 100 MCG PO TABS: 100.0000 ug | ORAL_TABLET | Freq: Every day | ORAL | Status: DC

## 2014-11-02 NOTE — Progress Notes (Signed)
Subjective:    Patient ID: Cathy Coffey, female    DOB: 24-Feb-1941, 73 y.o.   MRN: [id number]  HPI The patient is here for annual Medicare wellness examination and management of other chronic and acute problems.   The risk factors are reflected in the social history.  The roster of all physicians providing medical care to patient - is listed in the Snapshot section of the chart.  Activities of daily living:  The patient is 100% independent in all ADLs: dressing, toileting, feeding as well as independent mobility. Lives with son and granddaughter. Has dog in home. Son smokes in bedroom.  Home safety : The patient has smoke detectors in the home. Has alarm system. They wear seatbelts.  There are no firearms at home. There is no violence in the home.   There is no risks for hepatitis, STDs or HIV. There is history of blood transfusion in 1960s. They have no travel history to infectious disease endemic areas of the world.  The patient has seen their dentist in the last six month. (Dentist - Dr. Norman Herrlich) They have seen their eye doctor in the last year. 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 Vision has declined in right eye. (Opthalmology - Dr. Matilde Sprang) No issues with hearing They have deferred audiologic testing in the last year.    They do not  have excessive sun exposure. Discussed the need for sun protection: hats, long sleeves and use of sunscreen if there is significant sun exposure. Dermatologist - none recently.  Diet: the importance of a healthy diet is discussed. They do have a healthy diet.   The benefits of regular aerobic exercise were discussed. Tries to go to gym 3 times per week. Limited by family responsibilities.  Depression screen: there are no signs or vegative symptoms of depression- irritability, change in appetite, anhedonia, sadness/tearfullness. Recent worsening of symptoms, with ongoing responsibilities caring for parents and granddaughter.  Cognitive assessment:  the patient manages all their financial and personal affairs and is actively engaged. They could relate day,date,year and events.  The following portions of the patient's history were reviewed and updated as appropriate: allergies, current medications, past family history, past medical history,  past surgical history, past social history  and problem list.  Visual acuity was not assessed per patient preference since she has regular follow up with her ophthalmologist. Hearing and body mass index were assessed and reviewed.   During the course of the visit the patient was educated and counseled about appropriate screening and preventive services including : fall prevention , diabetes screening, nutrition counseling, colorectal cancer screening, and recommended immunizations.     Past medical, surgical, family and social history per today's encounter.  Review of Systems  Constitutional: Negative for fever, chills, appetite change, fatigue and unexpected weight change.  Eyes: Negative for visual disturbance.  Respiratory: Negative for shortness of breath.   Cardiovascular: Negative for chest pain and leg swelling.  Gastrointestinal: Negative for nausea, vomiting, abdominal pain, diarrhea and constipation.  Musculoskeletal: Negative for myalgias and arthralgias.  Skin: Negative for color change and rash.  Hematological: Negative for adenopathy. Does not bruise/bleed easily.  Psychiatric/Behavioral: Negative for sleep disturbance and dysphoric mood. The patient is not nervous/anxious.        Objective:    BP 103/72 mmHg  Pulse 70  Temp(Src) 98.1 F (36.7 C) (Oral)  Ht 5' 1.5" (1.562 m)  Wt 188 lb 4 oz (85.39 kg)  BMI 35.00 kg/m2  SpO2 97% Physical  Exam  Constitutional: She is oriented to person, place, and time. She appears well-developed and well-nourished. No distress.  HENT:  Head: Normocephalic and atraumatic.  Right Ear: External ear normal.  Left Ear: External ear normal.    Nose: Nose normal.  Mouth/Throat: Oropharynx is clear and moist. No oropharyngeal exudate.  Eyes: Conjunctivae are normal. Pupils are equal, round, and reactive to light. Right eye exhibits no discharge. Left eye exhibits no discharge. No scleral icterus.  Neck: Normal range of motion. Neck supple. No tracheal deviation present. No thyromegaly present.  Cardiovascular: Normal rate, regular rhythm, normal heart sounds and intact distal pulses.  Exam reveals no gallop and no friction rub.   No murmur heard. Pulmonary/Chest: Effort normal and breath sounds normal. No accessory muscle usage. No tachypnea. No respiratory distress. She has no decreased breath sounds. She has no wheezes. She has no rales. She exhibits no tenderness. Right breast exhibits no inverted nipple, no mass, no nipple discharge, no skin change and no tenderness. Left breast exhibits no inverted nipple, no mass, no nipple discharge, no skin change and no tenderness. Breasts are symmetrical.  Abdominal: Soft. Bowel sounds are normal. She exhibits no distension and no mass. There is no tenderness. There is no rebound and no guarding.  Musculoskeletal: Normal range of motion. She exhibits no edema or tenderness.  Lymphadenopathy:    She has no cervical adenopathy.  Neurological: She is alert and oriented to person, place, and time. No cranial nerve deficit. She exhibits normal muscle tone. Coordination normal.  Skin: Skin is warm and dry. No rash noted. She is not diaphoretic. No erythema. No pallor.  Psychiatric: She has a normal mood and affect. Her behavior is normal. Judgment and thought content normal.          Assessment & Plan:   Problem List Items Addressed This Visit      Unprioritized   RESOLVED: Abdominal pain, epigastric   Relevant Medications      pantoprazole (PROTONIX) EC tablet   Medicare annual wellness visit, subsequent - Primary    General medical exam normal today including breast exam. PAP and pelvic  deferred given age and preference. Mammogram ordered. Colonoscopy referral was scheduled, but pt canceled, she would like to hold off on this for now. Prevnar and Flu vaccine today. Labs today including CBC, CMP, lipids, TSH. Follow up in 6 months and prn.    Relevant Orders      CBC with Differential      Comprehensive metabolic panel      Lipid panel      Microalbumin / creatinine urine ratio      Vit D  25 hydroxy (rtn osteoporosis monitoring)      TSH   Screening for breast cancer   Relevant Orders      MM Digital Screening       Return in about 6 months (around 05/04/2015) for Recheck.

## 2014-11-02 NOTE — Patient Instructions (Signed)

## 2014-11-02 NOTE — Progress Notes (Signed)
Pre visit review using our clinic review tool, if applicable. No additional management support is needed unless otherwise documented below in the visit note. 

## 2014-11-02 NOTE — Assessment & Plan Note (Signed)
General medical exam normal today including breast exam. PAP and pelvic deferred given age and preference. Mammogram ordered. Colonoscopy referral was scheduled, but pt canceled, she would like to hold off on this for now. Prevnar and Flu vaccine today. Labs today including CBC, CMP, lipids, TSH. Follow up in 6 months and prn.

## 2014-11-02 NOTE — Addendum Note (Signed)
Addended by: Vernetta Honey on: 11/02/2014 02:50 PM   Modules accepted: Orders

## 2014-11-03 LAB — TSH: TSH: 0.63 u[IU]/mL (ref 0.35–4.50)

## 2014-11-15 ENCOUNTER — Emergency Department: Payer: Self-pay | Admitting: Emergency Medicine

## 2014-11-15 LAB — CBC
HCT: 40.4 % (ref 35.0–47.0)
HGB: 13.1 g/dL (ref 12.0–16.0)
MCH: 29.5 pg (ref 26.0–34.0)
MCHC: 32.4 g/dL (ref 32.0–36.0)
MCV: 91 fL (ref 80–100)
Platelet: 167 10*3/uL (ref 150–440)
RBC: 4.44 10*6/uL (ref 3.80–5.20)
RDW: 14.1 % (ref 11.5–14.5)
WBC: 8.1 10*3/uL (ref 3.6–11.0)

## 2014-11-15 LAB — URINALYSIS, COMPLETE
Bacteria: NONE SEEN
Bilirubin,UR: NEGATIVE
Blood: NEGATIVE
Glucose,UR: NEGATIVE mg/dL (ref 0–75)
Ketone: NEGATIVE
Leukocyte Esterase: NEGATIVE
Nitrite: NEGATIVE
PROTEIN: NEGATIVE
Ph: 7 (ref 4.5–8.0)
SQUAMOUS EPITHELIAL: NONE SEEN
Specific Gravity: 1.012 (ref 1.003–1.030)
WBC UR: NONE SEEN /HPF (ref 0–5)

## 2014-11-15 LAB — COMPREHENSIVE METABOLIC PANEL
ALK PHOS: 88 U/L
ANION GAP: 7 (ref 7–16)
Albumin: 3.6 g/dL (ref 3.4–5.0)
BILIRUBIN TOTAL: 0.3 mg/dL (ref 0.2–1.0)
BUN: 17 mg/dL (ref 7–18)
CREATININE: 0.94 mg/dL (ref 0.60–1.30)
Calcium, Total: 8.8 mg/dL (ref 8.5–10.1)
Chloride: 105 mmol/L (ref 98–107)
Co2: 29 mmol/L (ref 21–32)
EGFR (African American): >60
EGFR (Non-African Amer.): >60
Glucose: 105 mg/dL — ABNORMAL HIGH (ref 65–99)
Osmolality: 283 (ref 275–301)
POTASSIUM: 3.9 mmol/L (ref 3.5–5.1)
SGOT(AST): 19 U/L (ref 15–37)
SGPT (ALT): 24 U/L
Sodium: 141 mmol/L (ref 136–145)
Total Protein: 6.8 g/dL (ref 6.4–8.2)

## 2014-11-15 LAB — LIPASE, BLOOD: Lipase: 106 U/L (ref 73–393)

## 2014-11-17 ENCOUNTER — Telehealth: Payer: Self-pay | Admitting: *Deleted

## 2014-11-17 ENCOUNTER — Telehealth: Payer: Self-pay | Admitting: Internal Medicine

## 2014-11-17 NOTE — Telephone Encounter (Signed)
She is already on pantoprazole twice daily. If having persistent pain, needs to be evaluated ASAP, in the ED, as may need more urgent endoscopy.

## 2014-11-17 NOTE — Telephone Encounter (Signed)
Pt states she needs to be seen prior to scheduled OV. Pt scheduled to see Alonza Bogus PA 11/21/14@10am . Pt aware of appt.

## 2014-11-17 NOTE — Telephone Encounter (Signed)
Spoke with pt, advised of MDs message.  She states she is unable to keep anything down without pain.  Pt advised to either call Endo to attempt to have appoint moved up or to be evaluated in the ER for continued symptoms.  Pt verbalized agreement.

## 2014-11-17 NOTE — Telephone Encounter (Signed)
Left message on VM for pt to return call.

## 2014-11-17 NOTE — Telephone Encounter (Signed)
Pt states that she is having a lot of abdominal pain, she cannot eat or drink without pain.  Pt says she has an appt with Dr Hilarie Fredrickson on 01/03/15 but she cannot wait that long to be seen.  Please advise.

## 2014-11-21 ENCOUNTER — Encounter: Payer: Self-pay | Admitting: Gastroenterology

## 2014-11-21 ENCOUNTER — Ambulatory Visit (INDEPENDENT_AMBULATORY_CARE_PROVIDER_SITE_OTHER): Payer: Commercial Managed Care - HMO | Admitting: Gastroenterology

## 2014-11-21 VITALS — BP 122/60 | HR 80 | Ht 60.38 in | Wt 182.0 lb

## 2014-11-21 DIAGNOSIS — R1013 Epigastric pain: Secondary | ICD-10-CM

## 2014-11-21 DIAGNOSIS — Z791 Long term (current) use of non-steroidal anti-inflammatories (NSAID): Secondary | ICD-10-CM

## 2014-11-21 DIAGNOSIS — R1011 Right upper quadrant pain: Secondary | ICD-10-CM

## 2014-11-21 HISTORY — DX: Long term (current) use of non-steroidal anti-inflammatories (nsaid): Z79.1

## 2014-11-21 NOTE — Patient Instructions (Signed)

## 2014-11-21 NOTE — Progress Notes (Signed)
Agree with Cathy Coffey's management.  [name], MD, FACG  

## 2014-11-21 NOTE — Progress Notes (Signed)
11/21/2014 Cathy Coffey [id number] [birth year]   HISTORY OF PRESENT ILLNESS:  This is a 74 year old female who is new to our practice.  She was previously seen by Dr. Dionne Milo in [facility] for colonoscopy in 03/2010 at which time she was found to have only diverticulosis and hemorrhoids.    She presents to our office today for complaints of abdominal pain. She complains mostly of right upper quadrant and epigastric abdominal pain that she says has been occurring on and off for some time, approximately the past year. She said about 2 weeks ago the pain started again and has been more bothersome. She actually went to the emergency department on January 2 at which time a CT scan of the abdomen and pelvis was performed, which showed only prior cholecystectomy and sphincterotomy with mild common bile duct dilatation and pneumobilia with no findings concerning for acute pancreatitis, mild diffuse fatty infiltration of the liver, and moderate diverticulosis of the sigmoid colon with muscular wall thickening but no findings for acute diverticulitis. CBC, CMP, and lipase were within normal limits. She describes the pain as a constant ache. She does admit to chronic Excedrin use and says that she takes 2 of them every morning for the past 50 years. She denies any nausea, but does make herself vomit at times if the pain is more intense, and this does seem to relieve the pain. She has recently been started on twice daily PPI but admits that she does forget the evening dose at times.  She does have a past medical history of irritable bowel syndrome, but states that she has never had pain with her IBS previously. She is under a lot of stress while taking care of her 56 year old father as well as a young adopted grandchild.   Past Medical History  Diagnosis Date  . Asthma   . Hypothyroidism   . Hypertension   . Lichen planus   . Diverticulosis   . IBS (irritable bowel syndrome)   . Pancreatitis due to  common bile duct stone 2000   Past Surgical History  Procedure Laterality Date  . Cholecystectomy    . Hernia repair    . Tubal ligation      reports that she has never smoked. She has never used smokeless tobacco. She reports that she does not drink alcohol or use illicit drugs. family history includes Hypothyroidism in her mother. No Known Allergies    Outpatient Encounter Prescriptions as of 11/21/2014  Medication Sig  . Alum & Mag Hydroxide-Simeth (ANTACID ANTI-GAS PO) Take by mouth.  . Ascorbic Acid (VITAMIN C) 100 MG tablet Take 100 mg by mouth daily.  . B Complex-C (SUPER B COMPLEX PO) Take 1 capsule by mouth.  Marland Kitchen BIOTIN 5000 PO Take by mouth.  . Calcium Carbonate-Vitamin D (CALCIUM-VITAMIN D) 500-200 MG-UNIT per tablet Take 1 tablet by mouth daily.  . Clobetasol Prop Emollient Base 0.05 % emollient cream Apply topically 2 (two) times daily as needed.   . Coenzyme Q10 (COQ-10) 30 MG CAPS Take by mouth.  . Cyanocobalamin (B-12) 1000 MCG CAPS Take by mouth.  . desonide (DESOWEN) 0.05 % cream Apply topically 2 (two) times daily.  Marland Kitchen DIGESTIVE AIDS MIXTURE PO Take by mouth.  . furosemide (LASIX) 20 MG tablet Take 1 tablet (20 mg total) by mouth daily.  . Glucosamine HCl 1000 MG TABS Take 1 tablet by mouth.  . levothyroxine (SYNTHROID, LEVOTHROID) 100 MCG tablet Take 1 tablet (100 mcg total) by  mouth daily.  . Magnesium 250 MG TABS Take 500 mg by mouth.   . Misc Natural Products (ADVANCED JOINT RELIEF PO) Take by mouth.  . Multiple Vitamins-Minerals (HEALTHY EYES PO) Take by mouth.  . multivitamin-lutein (OCUVITE-LUTEIN) CAPS capsule Take 1 capsule by mouth daily.  Marland Kitchen NIACIN, ANTIHYPERLIPIDEMIC, PO Take 500 mg by mouth.  . Omega-3 Fatty Acids (FISH OIL) 1200 MG CAPS Take by mouth daily.  . pantoprazole (PROTONIX) 40 MG tablet Take 1 tablet (40 mg total) by mouth 2 (two) times daily.  Marland Kitchen POTASSIUM PO Take 600 mg by mouth.  . Probiotic Product (TRUBIOTICS PO) Take 1 capsule by mouth.    . Red Yeast Rice Extract (RED YEAST RICE PO) Take 1,200 mg by mouth daily.  . vitamin A 10000 UNIT capsule Take 10,000 Units by mouth daily.  . Vitamin D, Ergocalciferol, (DRISDOL) 50000 UNITS CAPS capsule Take 50,000 Units by mouth every 7 (seven) days.     REVIEW OF SYSTEMS  : All other systems reviewed and negative except where noted in the History of Present Illness.   PHYSICAL EXAM: BP 122/60 mmHg  Pulse 80  Ht 5' 0.38" (1.534 m)  Wt 182 lb (82.555 kg)  BMI 35.08 kg/m2 General: Well developed white female in no acute distress Head: Normocephalic and atraumatic Eyes:  Sclerae anicteric, conjunctiva pink. Ears: Normal auditory acuity Lungs: Clear throughout to auscultation Heart: Regular rate and rhythm Abdomen: Soft, non-distended.  Normal bowel sounds.  Minimal RUQ/epigastric TTP without R/R/G. Musculoskeletal: Symmetrical with no gross deformities  Skin: No lesions on visible extremities Extremities: No edema  Neurological: Alert oriented x 4, grossly non-focal Psychological:  Alert and cooperative. Normal mood and affect  ASSESSMENT AND PLAN: -RUQ/epigastric pain:  Labs and CT scan unremarkable.  She is up-to-date on colonoscopy.  She does have chronic daily NSAID use so is at risk for ulcer disease.  Could just be some dyspepsia or pain related to IBS.  Will schedule EGD with Dr. Carlean Purl for further evaluation.  The risks, benefits, and alternatives were discussed with the patient and she consents to proceed.  She will continue pantoprazole 40 mg BID and zantac prn for now.  I have advised her on the effects of chronic NSAID use.

## 2014-12-15 ENCOUNTER — Encounter: Payer: Self-pay | Admitting: Internal Medicine

## 2014-12-15 ENCOUNTER — Ambulatory Visit (AMBULATORY_SURGERY_CENTER): Payer: Commercial Managed Care - HMO | Admitting: Internal Medicine

## 2014-12-15 VITALS — BP 112/73 | HR 70 | Temp 97.2°F | Resp 16 | Ht 60.0 in | Wt 182.0 lb

## 2014-12-15 DIAGNOSIS — K2950 Unspecified chronic gastritis without bleeding: Secondary | ICD-10-CM

## 2014-12-15 DIAGNOSIS — R1013 Epigastric pain: Secondary | ICD-10-CM

## 2014-12-15 DIAGNOSIS — K2970 Gastritis, unspecified, without bleeding: Secondary | ICD-10-CM

## 2014-12-15 DIAGNOSIS — K2990 Gastroduodenitis, unspecified, without bleeding: Secondary | ICD-10-CM

## 2014-12-15 DIAGNOSIS — R1011 Right upper quadrant pain: Secondary | ICD-10-CM

## 2014-12-15 MED ORDER — SODIUM CHLORIDE 0.9 % IV SOLN: 500.0000 mL | INTRAVENOUS | Status: DC

## 2014-12-15 NOTE — Progress Notes (Signed)
A/ox3 pleased with MAC, report to Karen RN 

## 2014-12-15 NOTE — Progress Notes (Signed)
Called to room to assist during endoscopic procedure.  Patient ID and intended procedure confirmed with present staff. Received instructions for my participation in the procedure from the performing physician.  

## 2014-12-15 NOTE — Patient Instructions (Addendum)
The stomach is irritated or inflamed - we call that gastritis. Could be from the aspirin in Excedrin. I took biopsies to see what I could learn about it.  I will call you with results and plans. Try not to eat to the point you feel bad.  I appreciate the opportunity to care for you. Gatha Mayer, MD, FACG  YOU HAD AN ENDOSCOPIC PROCEDURE TODAY AT [facility]: Refer to the procedure report that was given to you for any specific questions about what was found during the examination.  If the procedure report does not answer your questions, please call your gastroenterologist to clarify.  If you requested that your care partner not be given the details of your procedure findings, then the procedure report has been included in a sealed envelope for you to review at your convenience later.  YOU SHOULD EXPECT: Some feelings of bloating in the abdomen. Passage of more gas than usual.  Walking can help get rid of the air that was put into your GI tract during the procedure and reduce the bloating. If you had a lower endoscopy (such as a colonoscopy or flexible sigmoidoscopy) you may notice spotting of blood in your stool or on the toilet paper. If you underwent a bowel prep for your procedure, then you may not have a normal bowel movement for a few days.  DIET: Your first meal following the procedure should be a light meal and then it is ok to progress to your normal diet.  A half-sandwich or bowl of soup is an example of a good first meal.  Heavy or fried foods are harder to digest and may make you feel nauseous or bloated.  Likewise meals heavy in dairy and vegetables can cause extra gas to form and this can also increase the bloating.  Drink plenty of fluids but you should avoid alcoholic beverages for 24 hours.  ACTIVITY: Your care partner should take you home directly after the procedure.  You should plan to take it easy, moving slowly for the rest of the day.  You can resume  normal activity the day after the procedure however you should NOT DRIVE or use heavy machinery for 24 hours (because of the sedation medicines used during the test).    SYMPTOMS TO REPORT IMMEDIATELY: A gastroenterologist can be reached at any hour.  During normal business hours, 8:30 AM to 5:00 PM Monday through Friday, call [phone].  After hours and on weekends, please call the GI answering service at [phone] who will take a message and have the physician on call contact you.  Following upper endoscopy (EGD)  Vomiting of blood or coffee ground material  New chest pain or pain under the shoulder blades  Painful or persistently difficult swallowing  New shortness of breath  Fever of 100F or higher  Black, tarry-looking stools  FOLLOW UP: If any biopsies were taken you will be contacted by phone or by letter within the next 1-3 weeks.  Call your gastroenterologist if you have not heard about the biopsies in 3 weeks.  Our staff will call the home number listed on your records the next business day following your procedure to check on you and address any questions or concerns that you may have at that time regarding the information given to you following your procedure. This is a courtesy call and so if there is no answer at the home number and we have not heard from you through the  emergency physician on call, we will assume that you have returned to your regular daily activities without incident.  SIGNATURES/CONFIDENTIALITY: You and/or your care partner have signed paperwork which will be entered into your electronic medical record.  These signatures attest to the fact that that the information above on your After Visit Summary has been reviewed and is understood.  Full responsibility of the confidentiality of this discharge information lies with you and/or your care-partner.

## 2014-12-15 NOTE — Op Note (Signed)
[facility]  [address] [place], [id number]   ENDOSCOPY PROCEDURE REPORT  PATIENT: Cathy, Coffey  MR#: [id number] BIRTHDATE: 1941-09-03 , 14  yrs. old GENDER: female ENDOSCOPIST: Gatha Mayer, MD, [facility] PROCEDURE DATE:  12/15/2014 PROCEDURE:  EGD w/ biopsy ASA CLASS:     Class II INDICATIONS:  epigastric pain and abdominal pain in the upper right quadrant. MEDICATIONS: Propofol 160 mg IV and Monitored anesthesia care TOPICAL ANESTHETIC: none  DESCRIPTION OF PROCEDURE: After the risks benefits and alternatives of the procedure were thoroughly explained, informed consent was obtained.  The LB [id number] endoscope was introduced through the mouth and advanced to the second portion of the duodenum , Without limitations.  The instrument was slowly withdrawn as the mucosa was fully examined.    1) Granular erythematous mucosal changes of gastric antrum. Geopgraphic patchy pattern.  gastritis suspected.  Biopsies taken. 2) Otherwise normal EGD.  Retroflexed views revealed no abnormalities.     The scope was then withdrawn from the patient and the procedure completed.  COMPLICATIONS: There were no immediate complications.  ENDOSCOPIC IMPRESSION: 1) Granular erythematous mucosal changes of gastric antrum. Geopgraphic patchy pattern.  gastritis suspected.  Biopsies taken. 2) Otherwise normal EGD  RECOMMENDATIONS: 1.  Await pathology results 2.  Office will call with results   eSigned:  Gatha Mayer, MD, [facility] 12/15/2014 2:51 PM    CC: Ronette Deter, MD and The Patient

## 2014-12-16 ENCOUNTER — Telehealth: Payer: Self-pay | Admitting: *Deleted

## 2014-12-16 NOTE — Telephone Encounter (Signed)
  Follow up Call-  Call back number 12/15/2014  Post procedure Call Back phone  # [phone]  Permission to leave phone message Yes     Patient questions:  Do you have a fever, pain , or abdominal swelling? No. Pain Score  0 *  Have you tolerated food without any problems? Yes.    Have you been able to return to your normal activities? Yes.    Do you have any questions about your discharge instructions: Diet   No. Medications  No. Follow up visit  No.  Do you have questions or concerns about your Care? No.  Actions: * If pain score is 4 or above: No action needed, pain <4.

## 2014-12-22 NOTE — Progress Notes (Signed)
Quick Note:  Let her know biopsies do not show a problem If she is still having problems let me know what they are  [facility] - no letter or recall If not continue current rx and see me prn ______

## 2015-01-03 ENCOUNTER — Ambulatory Visit: Payer: Medicare HMO | Admitting: Internal Medicine

## 2015-01-31 ENCOUNTER — Telehealth: Payer: Self-pay | Admitting: Internal Medicine

## 2015-01-31 NOTE — Telephone Encounter (Deleted)
Patient need a referral to [facility] [place]  Phone #

## 2015-01-31 NOTE — Telephone Encounter (Signed)
Error

## 2015-02-06 ENCOUNTER — Ambulatory Visit: Payer: Medicare HMO | Admitting: Internal Medicine

## 2015-02-24 ENCOUNTER — Telehealth: Payer: Self-pay | Admitting: *Deleted

## 2015-02-27 ENCOUNTER — Encounter: Payer: Self-pay | Admitting: Internal Medicine

## 2015-02-27 ENCOUNTER — Ambulatory Visit (INDEPENDENT_AMBULATORY_CARE_PROVIDER_SITE_OTHER): Payer: Commercial Managed Care - HMO | Admitting: Internal Medicine

## 2015-02-27 VITALS — BP 125/75 | HR 67 | Temp 98.0°F | Ht 61.5 in | Wt 191.4 lb

## 2015-02-27 DIAGNOSIS — M549 Dorsalgia, unspecified: Secondary | ICD-10-CM | POA: Insufficient documentation

## 2015-02-27 DIAGNOSIS — M545 Low back pain, unspecified: Secondary | ICD-10-CM | POA: Insufficient documentation

## 2015-02-27 DIAGNOSIS — L608 Other nail disorders: Secondary | ICD-10-CM | POA: Insufficient documentation

## 2015-02-27 DIAGNOSIS — L609 Nail disorder, unspecified: Secondary | ICD-10-CM

## 2015-02-27 NOTE — Telephone Encounter (Signed)
Fine to schedule in next available appt

## 2015-02-27 NOTE — Telephone Encounter (Signed)
Okay to schedule in the first available appointment? If you want to see her sooner please advise when

## 2015-02-27 NOTE — Patient Instructions (Signed)
Consider reading the book, "Always Hungry" by Isabella Stalling.  We will set up evaluation with physical therapy and with podiatry.

## 2015-02-27 NOTE — Progress Notes (Signed)
Pre visit review using our clinic review tool, if applicable. No additional management support is needed unless otherwise documented below in the visit note. 

## 2015-02-27 NOTE — Assessment & Plan Note (Signed)
Chronic mild low back pain. Discussed some options for management including medication, PT, imaging. She prefers to avoid medication. Will set up PT evaluation. Follow up 4 weeks and prn.

## 2015-02-27 NOTE — Progress Notes (Signed)
Subjective:    Patient ID: Cathy Coffey, female    DOB: [birth year], 74 y.o.   MRN: [id number]  HPI  74YO female presents for acute visit.  Right foot toenail problem  - right great toenail cracked and partially off. Would like toenail removed.  Low back pain - Aching pain in lower back. Chronic. Saw chiropractor in the past with some improvement. Last visit was today. Prefers not to use medication for this.   Wt Readings from Last 3 Encounters:  02/27/15 191 lb 6 oz (86.807 kg)  12/15/14 182 lb (82.555 kg)  11/21/14 182 lb (82.555 kg)     Past medical, surgical, family and social history per today's encounter.  Review of Systems  Constitutional: Negative for fever, chills, appetite change, fatigue and unexpected weight change.  Eyes: Negative for visual disturbance.  Respiratory: Negative for shortness of breath.   Cardiovascular: Negative for chest pain and leg swelling.  Gastrointestinal: Negative for abdominal pain, diarrhea and constipation.  Musculoskeletal: Positive for myalgias, back pain and arthralgias.  Skin: Negative for color change and rash.  Neurological: Negative for weakness and numbness.  Hematological: Negative for adenopathy. Does not bruise/bleed easily.  Psychiatric/Behavioral: Negative for dysphoric mood. The patient is not nervous/anxious.        Objective:    BP 125/75 mmHg  Pulse 67  Temp(Src) 98 F (36.7 C) (Oral)  Ht 5' 1.5" (1.562 m)  Wt 191 lb 6 oz (86.807 kg)  BMI 35.58 kg/m2  SpO2 97% Physical Exam  Constitutional: She is oriented to person, place, and time. She appears well-developed and well-nourished. No distress.  HENT:  Head: Normocephalic and atraumatic.  Right Ear: External ear normal.  Left Ear: External ear normal.  Nose: Nose normal.  Mouth/Throat: Oropharynx is clear and moist.  Eyes: Conjunctivae are normal. Pupils are equal, round, and reactive to light. Right eye exhibits no discharge. Left eye exhibits no  discharge. No scleral icterus.  Neck: Normal range of motion. Neck supple. No tracheal deviation present. No thyromegaly present.  Cardiovascular: Normal rate, regular rhythm, normal heart sounds and intact distal pulses.  Exam reveals no gallop and no friction rub.   No murmur heard. Pulmonary/Chest: Effort normal and breath sounds normal. No respiratory distress. She has no wheezes. She has no rales. She exhibits no tenderness.  Musculoskeletal: Normal range of motion. She exhibits no edema or tenderness.  Lymphadenopathy:    She has no cervical adenopathy.  Neurological: She is alert and oriented to person, place, and time. No cranial nerve deficit. She exhibits normal muscle tone. Coordination normal.  Skin: Skin is warm and dry. No rash noted. She is not diaphoretic. No erythema. No pallor.     Psychiatric: She has a normal mood and affect. Her behavior is normal. Judgment and thought content normal.          Assessment & Plan:   Problem List Items Addressed This Visit      Unprioritized   Lumbago    Chronic mild low back pain. Discussed some options for management including medication, PT, imaging. She prefers to avoid medication. Will set up PT evaluation. Follow up 4 weeks and prn.      Relevant Orders   Ambulatory referral to Physical Therapy   Toenail deformity - Primary    Will set up podiatry evaluation for nail removal.      Relevant Orders   Ambulatory referral to Podiatry       Return in about 4 weeks (  around 03/27/2015) for Recheck.

## 2015-02-27 NOTE — Assessment & Plan Note (Signed)
Will set up podiatry evaluation for nail removal.

## 2015-03-15 ENCOUNTER — Ambulatory Visit (INDEPENDENT_AMBULATORY_CARE_PROVIDER_SITE_OTHER): Payer: Commercial Managed Care - HMO | Admitting: Podiatry

## 2015-03-15 VITALS — BP 121/78 | HR 75 | Resp 16 | Ht 61.0 in | Wt 186.0 lb

## 2015-03-15 DIAGNOSIS — L600 Ingrowing nail: Secondary | ICD-10-CM

## 2015-03-15 MED ORDER — NEOMYCIN-POLYMYXIN-HC 3.5-10000-1 OT SOLN: OTIC | Status: DC

## 2015-03-15 NOTE — Patient Instructions (Addendum)

## 2015-03-15 NOTE — Progress Notes (Signed)
   Subjective:    Patient ID: Cathy Coffey, female    DOB: 05/15/1941, 74 y.o.   MRN: [id number]  HPI Right great toenail. It keeps coming off most of the way. The toe does not hurt. It grows out and it starts chipping off . It has happened three times now .  I want the toenail removed so it does not come back   Review of Systems  All other systems reviewed and are negative.      Objective:   Physical Exam: I have reviewed her past medical history medications allergy surgery social history and review of systems. Pulses are strongly palpable. Neurologic sensorium is intact as well as the monofilament. Deep tendon reflexes are intact bilateral and muscle strength +5 over 5 dorsiflexion plantar flexors and inverters everters onto the musculatures intact. Orthopedic evaluation of his right Salter-Harris distal to the ankle for range of motion without crepitation. Cutaneous evaluation demonstrates thick yellow dystrophic painful hallux nail right.        Assessment & Plan:  Assessment: Ingrown nail paronychia abscess hallux right.  Plan: Total nail matrixectomy was performed today after local anesthesia was administered. She tolerated procedure well and 3 applications of phenol were applied the nail root in bed. He was neutralized less of alcohol Silvadene cream Chapel pattern across a compressive dressing was then applied. She was given both oral and written home-going instructions for care and soaking of her toe. She is also provided a prescription for Cortisporin Otic which will be applied twice daily and covered. I will follow-up with her in 1 week

## 2015-03-22 ENCOUNTER — Ambulatory Visit (INDEPENDENT_AMBULATORY_CARE_PROVIDER_SITE_OTHER): Payer: Commercial Managed Care - HMO | Admitting: Podiatry

## 2015-03-22 DIAGNOSIS — L600 Ingrowing nail: Secondary | ICD-10-CM

## 2015-03-23 NOTE — Progress Notes (Signed)
She presents today for follow-up of her total nail avulsion and matrixectomy hallux right. She denies fever chills nausea vomiting muscle aches and pains and states that appears to be healing well. She continues to soak twice daily.  Objective: Vital signs are stable alert and oriented 3. Granulation tissue is intact with epithelialization to hallux nailbed right. There is no signs of infection or purulence and no malodor.  Assessment: Well-healing surgical toe hallux right.  Plan: Discontinue Betadine sterile with Epsom salts and warm water soaks covered in the daily that night.

## 2015-04-05 ENCOUNTER — Ambulatory Visit: Payer: Medicare HMO | Admitting: Internal Medicine

## 2015-04-06 ENCOUNTER — Ambulatory Visit (INDEPENDENT_AMBULATORY_CARE_PROVIDER_SITE_OTHER): Payer: Commercial Managed Care - HMO | Admitting: Podiatry

## 2015-04-06 VITALS — BP 154/94 | HR 89 | Resp 16

## 2015-04-06 DIAGNOSIS — L089 Local infection of the skin and subcutaneous tissue, unspecified: Secondary | ICD-10-CM

## 2015-04-06 MED ORDER — CEPHALEXIN 500 MG PO CAPS: 500.0000 mg | ORAL_CAPSULE | Freq: Three times a day (TID) | ORAL | Status: DC

## 2015-04-06 NOTE — Patient Instructions (Signed)

## 2015-04-09 NOTE — Progress Notes (Signed)
Patient ID: Cathy Coffey, female   DOB: 02/10/1941, 74 y.o.   MRN: [id number]  Subjective: 74 year old female presents the office today for follow-up evaluation status post right hallux toenail removal with Dr. Milinda Pointer performed on 03/15/2015. She states that since her last appointment she was switching to Epson salt soaks however he did feel of the toe got infected that point. Since then she didn't continue with the antibiotic soap soaks. She states that over the last week she has noticed increased pain to the hallux as well as a slight redness to the area. She denies any drainage or purulence. Denies any red streaks. Denies any systemic complaints such as fevers, chills, nausea, vomiting. No other complaints at this time.   Objective: AAO x3, NAD DP/PT pulses palpable b/l, CRT < 3 sec Protective sensation appears to be intact with Derrel Nip monofilament. Status post right hallux total nail avulsion. The nail bed is granular. There is mild tenderness palpation along the proximal nail border. There is slight erythema around the nail border however there is no ascending cellulitis. There is no areas of fluctuance or crepitus. There is no drainage or purulence. No malodor. There is a slight amount of macerated tissue around the distal aspect of the nail bed. No other areas of tenderness to bilateral lower extremities. No other open lesions or pre-ulcerative lesions. No pain with calf compression, swelling, warmth, erythema.   Assessment: 74 year old female with mild localized erythema around the right hallux nail status post avulsion  Plan: -Treatment options were discussed including alternatives, risks, complications. -The nailbed was curetted to open the proximal nail border to allow for drainage. -I recommended the patient to continue soaking in either epsom salt or antibiotic soap BID. Cover with either antibiotic ointment or continue Corticosporin drops. Can certainly the area uncovered at  night or if not wearing shoes and socks at home to help the area dry as infection resolves. -Prescribed Keflex. -Monitor closely for any clinical signs or symptoms of worsening infection and directed to call the office immediately should any occur go to the ER. -Follow-up in 1 week or sooner if any problems are to arise. Call the office with any question, concerns, change in symptoms in the meantime.

## 2015-04-13 ENCOUNTER — Telehealth: Payer: Self-pay | Admitting: *Deleted

## 2015-04-13 NOTE — Telephone Encounter (Signed)
Pt states she had a ingrown toenail procedure 1 month ago and it is not draining or healing properly, and she is taking the antibiotic prescribed last Thursday and using Epsom salt soaks.  I told pt it continue the Epsom salt soaks with antibiotic dressing and I would have the schedulers call her 1st thing tomorrow to schedule to be seen in office.

## 2015-04-14 ENCOUNTER — Ambulatory Visit (INDEPENDENT_AMBULATORY_CARE_PROVIDER_SITE_OTHER): Payer: Commercial Managed Care - HMO

## 2015-04-14 ENCOUNTER — Ambulatory Visit (INDEPENDENT_AMBULATORY_CARE_PROVIDER_SITE_OTHER): Payer: Commercial Managed Care - HMO | Admitting: Podiatry

## 2015-04-14 ENCOUNTER — Encounter: Payer: Self-pay | Admitting: Podiatry

## 2015-04-14 VITALS — BP 109/75 | HR 84 | Resp 16

## 2015-04-14 DIAGNOSIS — L089 Local infection of the skin and subcutaneous tissue, unspecified: Secondary | ICD-10-CM

## 2015-04-15 NOTE — Progress Notes (Signed)
She presents today for follow-up of a matrixectomy was performed approximately 1 month ago. She tolerated the procedure well however she has tingling to retain erythema to the proximal border without complete epithelialization of the toe nail bed. She states that the toe sometimes sore but really never hurts she's noticed no drainage and she denies fever chills nausea or vomiting. He states that the Band-Aids seem to irritate the skin and the toenail bed. She has been soaking the toe however she's only been utilizing 2 tablespoons of Epsom salt than a gallon water.  Objective: Vital signs are stable she is alert and oriented 3. The nailbed appears to be clean epithelialization is occurring she has some erythema proximal however it does not extend past the hallux interphalangeal joint. There is no pain on palpation and I am unable to express any fluid. It appears to be more inflammation than infection. Radiographs were performed as a precaution today to evaluate for osteomyelitis I was unable to visualize any periostitis or breakdown in the cortex of the bone. The distal phalanx appears to be normal.  Assessment: Delayed healing matrixectomy hallux right.  Plan: I suggested that she increase the Epsom salts concentration to at least a half a cup to a liter of water. Covered during the day leave open at bedtime and I will follow-up with her at her regular scheduled appointment in the next 2 weeks.

## 2015-04-27 ENCOUNTER — Ambulatory Visit: Payer: Self-pay | Admitting: Podiatry

## 2015-04-28 ENCOUNTER — Ambulatory Visit: Payer: Self-pay | Admitting: Podiatry

## 2015-05-23 ENCOUNTER — Other Ambulatory Visit: Payer: Self-pay | Admitting: Internal Medicine

## 2015-05-23 DIAGNOSIS — H029 Unspecified disorder of eyelid: Secondary | ICD-10-CM

## 2015-11-02 IMAGING — CT CT ABD-PELV W/ CM
2 of 5 series · 16 of 46 positions shown, 18 images · IV contrast (omnipaque)
Comparison: 05/05/2014.

CLINICAL DATA: One-week history of epigastric abdominal pain and
weakness. Prior history of pancreatitis and diverticulitis.

EXAM:
CT ABDOMEN AND PELVIS WITH CONTRAST
TECHNIQUE: Multidetector CT imaging of the abdomen and pelvis was performed
using the standard protocol following bolus administration of
intravenous contrast.
CONTRAST:  100 cc Omnipaque 350

[Series 2: routine abd pel with · axial · 0.77mm/px · z∈[-1038,-658]mm · 13 of 86 slices shown, 15 images]
[im 5/86  soft-tissue]
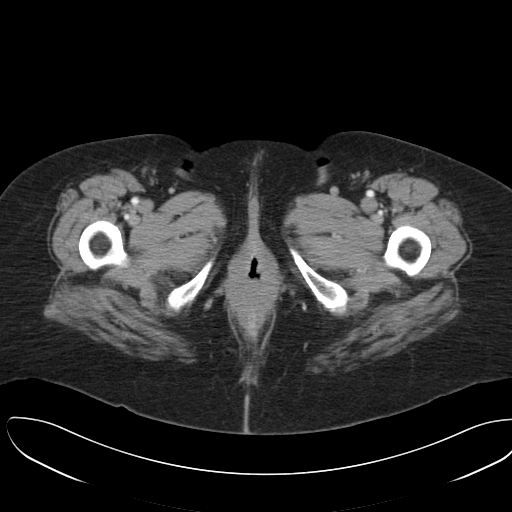
[im 5/86  bone]
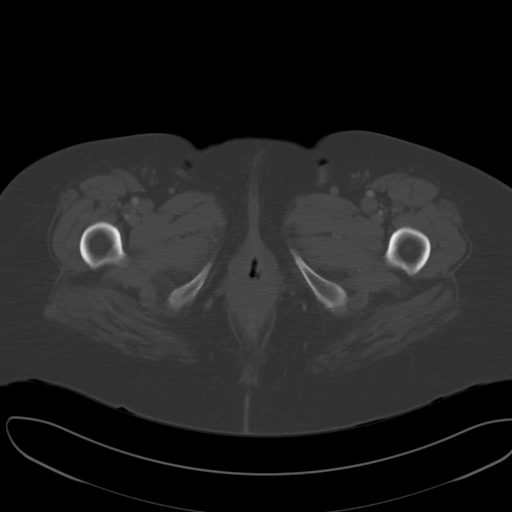
[im 10/86  soft-tissue]
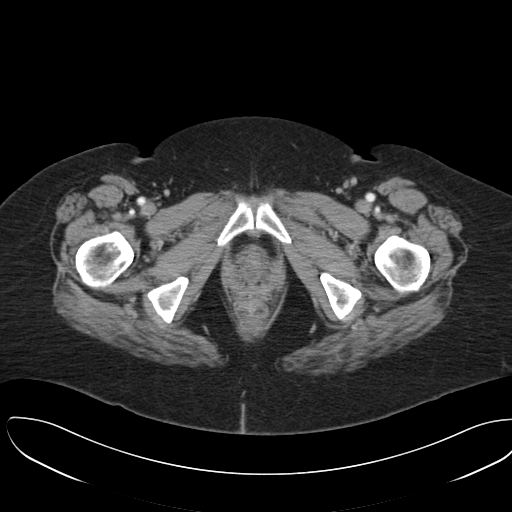
[im 19/86  soft-tissue]
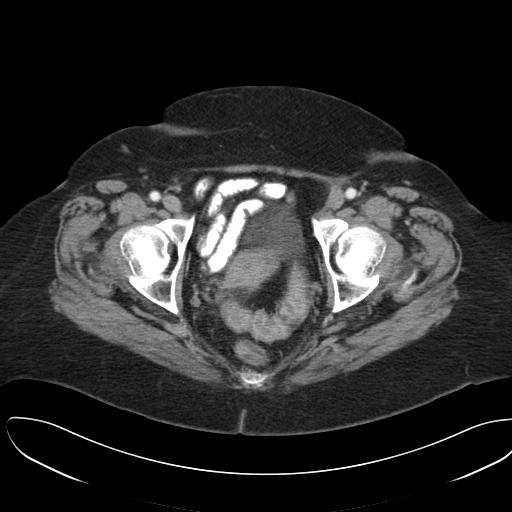
[im 24/86  soft-tissue]
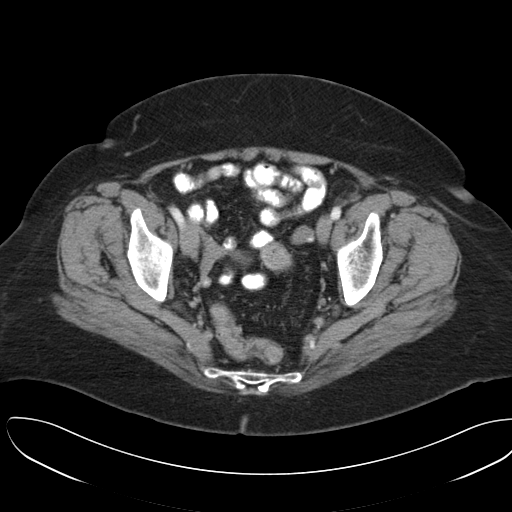
[im 29/86  soft-tissue]
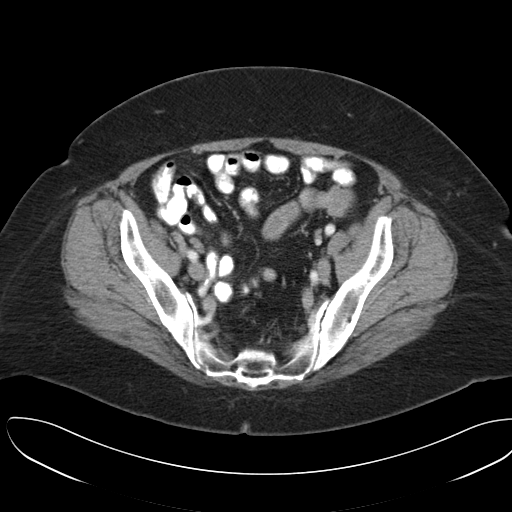
[im 38/86  soft-tissue]
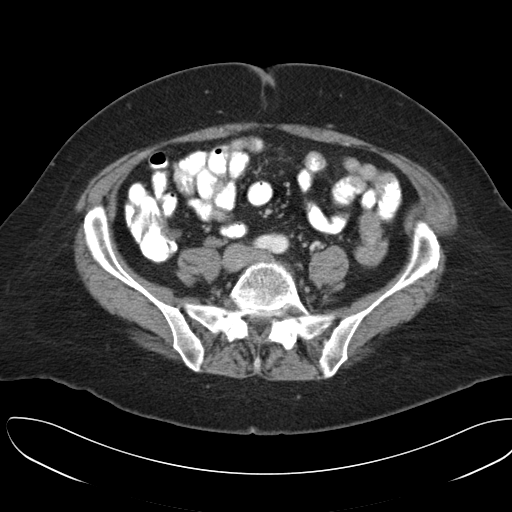
[im 43/86  soft-tissue]
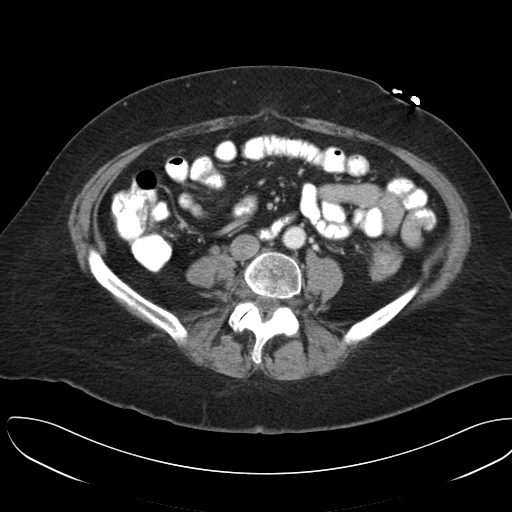
[im 48/86  soft-tissue]
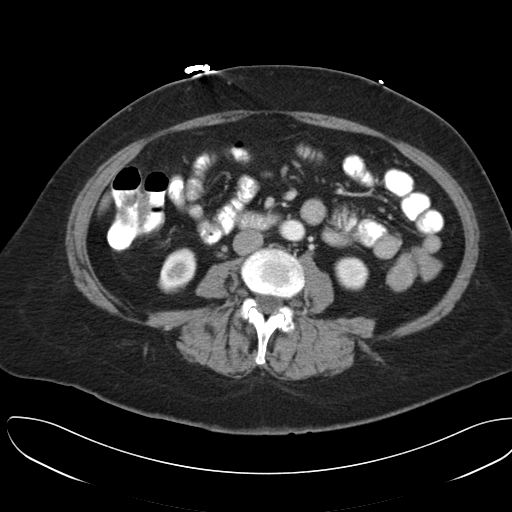
[im 57/86  soft-tissue]
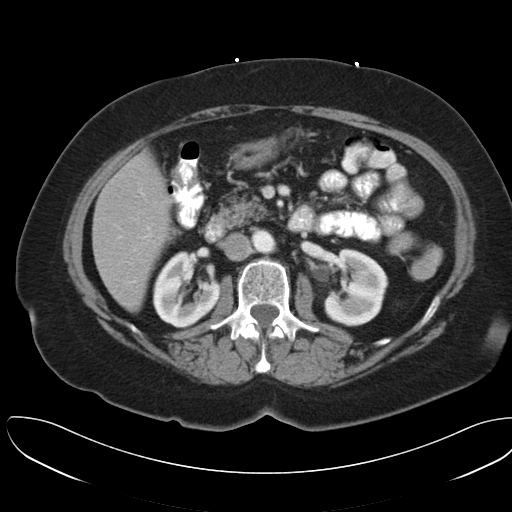
[im 57/86  bone]
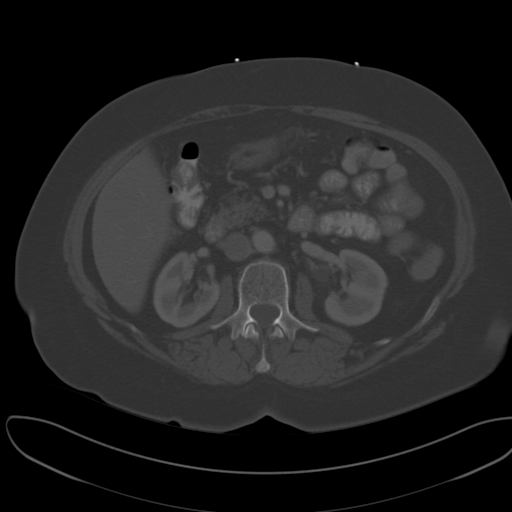
[im 62/86  soft-tissue]
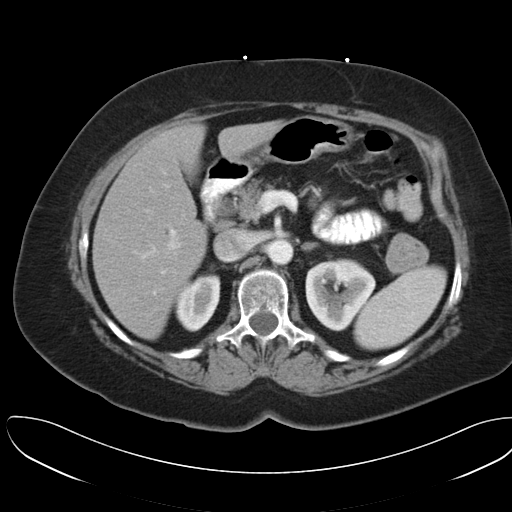
[im 67/86  soft-tissue]
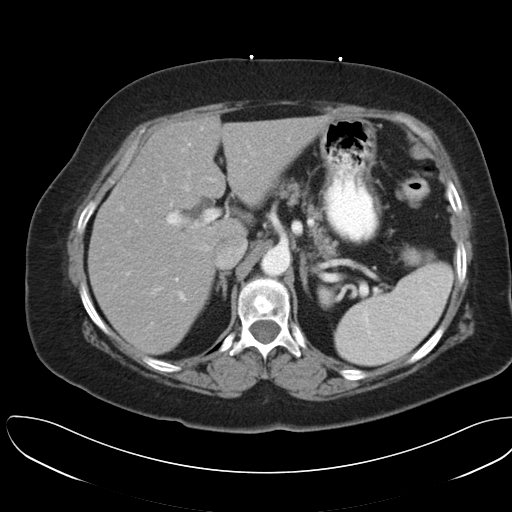
[im 76/86  soft-tissue]
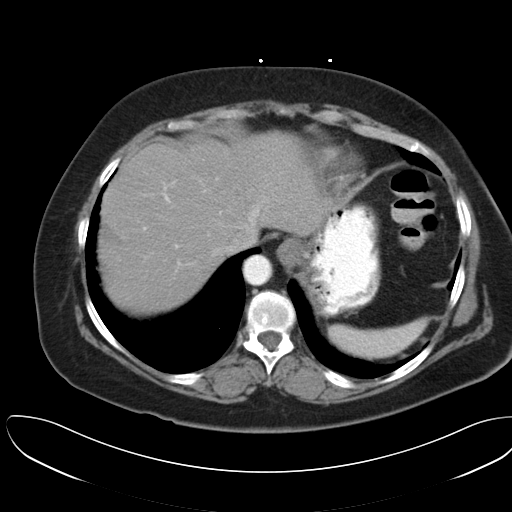
[im 81/86  soft-tissue]
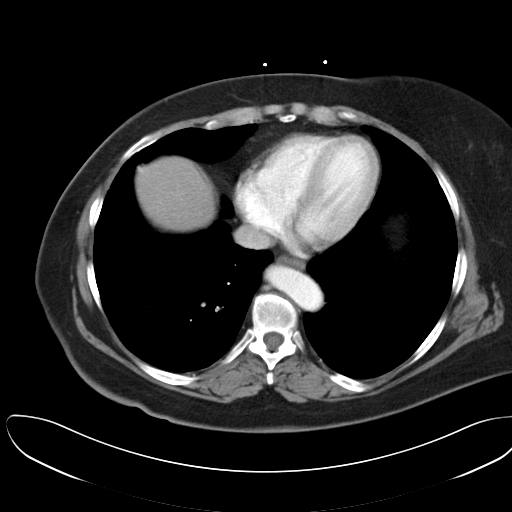

[Series 5: cor routine abd pel with · coronal · 0.72mm/px · 3 of 147 slices shown]
[im 49/147  soft-tissue]
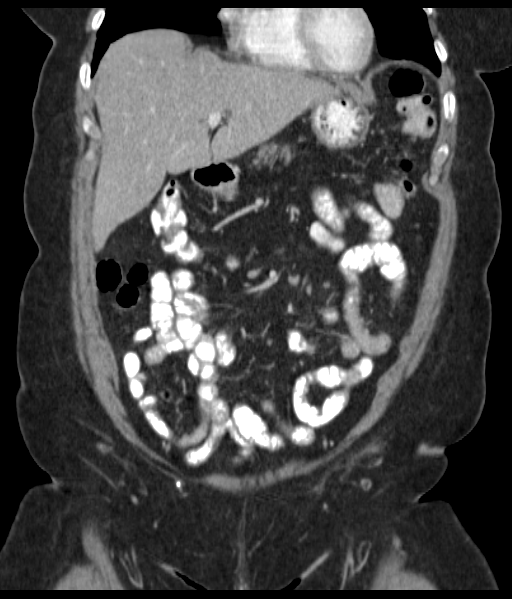
[im 65/147  soft-tissue]
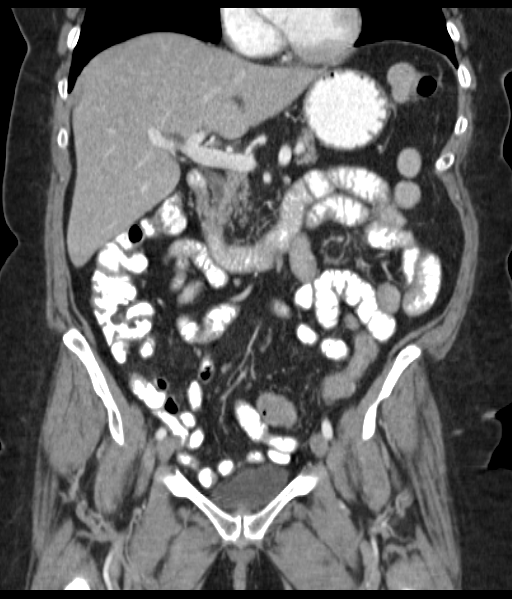
[im 82/147  soft-tissue]
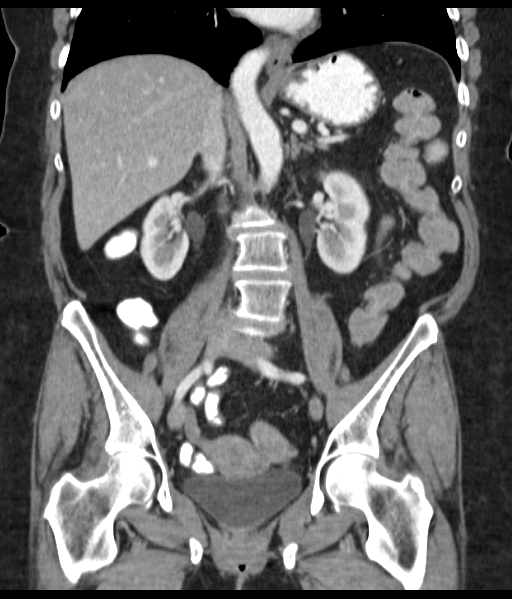

[16 of 46 positions shown; findings below may reference images not displayed]

FINDINGS: Lower chest: The lung bases are clear except for minimal patchy
areas of atelectasis. No pleural effusion or pulmonary lesions. The
heart is normal in size. No pericardial effusion. The distal
esophagus is grossly normal. There is moderate tortuosity of the
thoracic aorta.

Hepatobiliary: Diffuse fatty infiltration of the liver but no focal
hepatic lesions or intrahepatic biliary dilatation. Mild stable
common bile duct dilatation and pneumobilia likely due to prior
cholecystectomy and sphincterotomy.

Pancreas: Normal.

Spleen: Normal size.  No focal lesions.

Adrenals/Urinary Tract: The adrenal glands are stable. Small nodules
bilaterally are likely benign adenomas. There are small bilateral
renal cysts. No worrisome renal lesions, renal calculi or
hydronephrosis. No obstructing ureteral calculi. No bladder calculi.

Stomach/Bowel: The stomach, duodenum, small bowel and colon are
unremarkable. No inflammatory changes, mass lesions or obstructive
findings. The terminal ileum is normal. The appendix is normal.
Moderate diverticulosis and muscular wall thickening involving the
sigmoid colon but no findings for acute diverticulitis.

Vascular/Lymphatic: No mesenteric or retroperitoneal mass or
lymphadenopathy. The aorta and branch vessels are normal except for
mild tortuosity. The major venous structures are patent.

Reproductive: The uterus and ovaries are normal. The bladder is
normal. No pelvic mass, adenopathy or free pelvic fluid collections.
No inguinal mass or adenopathy.

Other: No abdominal wall hernia or subcutaneous lesions.

Musculoskeletal: No significant bony findings.
IMPRESSION: 1. Prior cholecystectomy and sphincterotomy with mild common bile
duct dilatation and pneumobilia. No findings for acute pancreatitis.
2. Mild diffuse fatty infiltration of the liver.
3. No acute abdominal/pelvic findings, mass lesions or
lymphadenopathy.
4. Moderate diverticulosis of the sigmoid colon with muscular wall
thickening but no findings for acute diverticulitis.

## 2015-11-07 ENCOUNTER — Ambulatory Visit: Payer: Commercial Managed Care - HMO | Admitting: Internal Medicine

## 2015-11-09 ENCOUNTER — Encounter: Payer: Self-pay | Admitting: Internal Medicine

## 2015-11-09 ENCOUNTER — Other Ambulatory Visit: Payer: Self-pay | Admitting: Internal Medicine

## 2015-11-09 ENCOUNTER — Ambulatory Visit (INDEPENDENT_AMBULATORY_CARE_PROVIDER_SITE_OTHER): Payer: Commercial Managed Care - HMO | Admitting: Internal Medicine

## 2015-11-09 VITALS — BP 125/81 | HR 75 | Temp 97.7°F | Ht 60.5 in | Wt 181.1 lb

## 2015-11-09 DIAGNOSIS — E039 Hypothyroidism, unspecified: Secondary | ICD-10-CM | POA: Diagnosis not present

## 2015-11-09 DIAGNOSIS — I10 Essential (primary) hypertension: Secondary | ICD-10-CM

## 2015-11-09 DIAGNOSIS — Z0000 Encounter for general adult medical examination without abnormal findings: Secondary | ICD-10-CM

## 2015-11-09 DIAGNOSIS — R1013 Epigastric pain: Secondary | ICD-10-CM

## 2015-11-09 DIAGNOSIS — Z23 Encounter for immunization: Secondary | ICD-10-CM | POA: Diagnosis not present

## 2015-11-09 DIAGNOSIS — Z1231 Encounter for screening mammogram for malignant neoplasm of breast: Secondary | ICD-10-CM

## 2015-11-09 LAB — COMPREHENSIVE METABOLIC PANEL
ALK PHOS: 92 U/L (ref 39–117)
ALT: 12 U/L (ref 0–35)
AST: 14 U/L (ref 0–37)
Albumin: 4 g/dL (ref 3.5–5.2)
BILIRUBIN TOTAL: 0.3 mg/dL (ref 0.2–1.2)
BUN: 24 mg/dL — ABNORMAL HIGH (ref 6–23)
CALCIUM: 8.9 mg/dL (ref 8.4–10.5)
CO2: 29 mEq/L (ref 19–32)
CREATININE: 0.91 mg/dL (ref 0.40–1.20)
Chloride: 103 mEq/L (ref 96–112)
GFR: 64.52 mL/min (ref >60.00)
GLUCOSE: 106 mg/dL — AB (ref 70–99)
Potassium: 3.6 mEq/L (ref 3.5–5.1)
Sodium: 141 mEq/L (ref 135–145)
TOTAL PROTEIN: 6.6 g/dL (ref 6.0–8.3)

## 2015-11-09 LAB — CBC WITH DIFFERENTIAL/PLATELET
BASOS ABS: 0 10*3/uL (ref 0.0–0.1)
Basophils Relative: 0.5 % (ref 0.0–3.0)
Eosinophils Absolute: 0.2 10*3/uL (ref 0.0–0.7)
Eosinophils Relative: 2.9 % (ref 0.0–5.0)
HEMATOCRIT: 38 % (ref 36.0–46.0)
Hemoglobin: 12.4 g/dL (ref 12.0–15.0)
LYMPHS ABS: 1.1 10*3/uL (ref 0.7–4.0)
Lymphocytes Relative: 17 % (ref 12.0–46.0)
MCHC: 32.8 g/dL (ref 30.0–36.0)
MCV: 86 fl (ref 78.0–100.0)
Monocytes Absolute: 0.4 10*3/uL (ref 0.1–1.0)
Monocytes Relative: 5.8 % (ref 3.0–12.0)
Neutro Abs: 4.6 10*3/uL (ref 1.4–7.7)
Neutrophils Relative %: 73.8 % (ref 43.0–77.0)
PLATELETS: 190 10*3/uL (ref 150.0–400.0)
RBC: 4.42 Mil/uL (ref 3.87–5.11)
RDW: 14.7 % (ref 11.5–15.5)
WBC: 6.2 10*3/uL (ref 4.0–10.5)

## 2015-11-09 LAB — LIPID PANEL
Cholesterol: 203 mg/dL — ABNORMAL HIGH (ref 0–200)
HDL: 57.6 mg/dL (ref >39.00)
LDL Cholesterol: 120 mg/dL — ABNORMAL HIGH (ref 0–99)
NONHDL: 145.18
TRIGLYCERIDES: 128 mg/dL (ref 0.0–149.0)
Total CHOL/HDL Ratio: 4
VLDL: 25.6 mg/dL (ref 0.0–40.0)

## 2015-11-09 LAB — TSH: TSH: 0.54 u[IU]/mL (ref 0.35–4.50)

## 2015-11-09 MED ORDER — PANTOPRAZOLE SODIUM 40 MG PO TBEC: 40.0000 mg | DELAYED_RELEASE_TABLET | Freq: Two times a day (BID) | ORAL | Status: DC

## 2015-11-09 MED ORDER — LEVOTHYROXINE SODIUM 100 MCG PO TABS: 100.0000 ug | ORAL_TABLET | Freq: Every day | ORAL | Status: DC

## 2015-11-09 MED ORDER — FUROSEMIDE 20 MG PO TABS: 20.0000 mg | ORAL_TABLET | Freq: Every day | ORAL | Status: DC

## 2015-11-09 NOTE — Progress Notes (Signed)
Subjective:    Patient ID: Cathy Coffey, female    DOB: 15-May-1941, 74 y.o.   MRN: [id number]  HPI  The patient is here for annual Medicare wellness examination and management of other chronic and acute problems.   The risk factors are reflected in the social history.  The roster of all physicians providing medical care to patient - is listed in the Snapshot section of the chart.  Activities of daily living:  The patient is 100% independent in all ADLs: dressing, toileting, feeding as well as independent mobility. Lives with son and granddaughter. Has dog in home. Son smokes in bedroom.  Home safety : The patient has smoke detectors in the home. Has alarm system. They wear seatbelts.  There are no firearms at home. There is no violence in the home.   There is no risks for hepatitis, STDs or HIV. There is history of blood transfusion in 1960s. They have no travel history to infectious disease endemic areas of the world.  The patient has seen their dentist in the last six month. (Dentist - Dr. Norman Herrlich) They have seen their eye doctor in the last year. 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 Vision has declined in right eye. (Opthalmology - Dr. Matilde Sprang) No issues with hearing They have deferred audiologic testing in the last year.    They do not  have excessive sun exposure. Discussed the need for sun protection: hats, long sleeves and use of sunscreen if there is significant sun exposure. Dermatologist - none recently.  Diet: the importance of a healthy diet is discussed. They do have a healthy diet.   The benefits of regular aerobic exercise were discussed. Tries to go to gym 3 times per week. Limited by family responsibilities.  Depression screen: there are no signs or vegative symptoms of depression- irritability, change in appetite, anhedonia, sadness/tearfullness.   Cognitive assessment: the patient manages all their financial and personal affairs and is actively engaged. They  could relate day,date,year and events.  The following portions of the patient's history were reviewed and updated as appropriate: allergies, current medications, past family history, past medical history,  past surgical history, past social history  and problem list.  Visual acuity was not assessed per patient preference since she has regular follow up with her ophthalmologist. Hearing and body mass index were assessed and reviewed.   During the course of the visit the patient was educated and counseled about appropriate screening and preventive services including : fall prevention , diabetes screening, nutrition counseling, colorectal cancer screening, and recommended immunizations.     Wt Readings from Last 3 Encounters:  11/09/15 181 lb 2 oz (82.158 kg)  03/15/15 186 lb (84.369 kg)  02/27/15 191 lb 6 oz (86.807 kg)   BP Readings from Last 3 Encounters:  11/09/15 125/81  04/14/15 109/75  04/06/15 154/94    Past Medical History  Diagnosis Date  . Asthma   . Hypothyroidism   . Hypertension   . Lichen planus   . Diverticulosis   . IBS (irritable bowel syndrome)   . Pancreatitis due to common bile duct stone 2000   Family History  Problem Relation Age of Onset  . Hypothyroidism Mother    Past Surgical History  Procedure Laterality Date  . Cholecystectomy    . Hernia repair    . Tubal ligation    . Colonoscopy  2011   Social History   Social History  . Marital Status: Divorced    Spouse  Name: N/A  . Number of Children: N/A  . Years of Education: N/A   Social History Main Topics  . Smoking status: Never Smoker   . Smokeless tobacco: Never Used  . Alcohol Use: No  . Drug Use: No  . Sexual Activity: Not Asked   Other Topics Concern  . None   Social History Narrative    Review of Systems  Constitutional: Negative for fever, chills, appetite change, fatigue and unexpected weight change.  Eyes: Negative for visual disturbance.  Respiratory: Negative for  shortness of breath.   Cardiovascular: Negative for chest pain and leg swelling.  Gastrointestinal: Negative for nausea, vomiting, abdominal pain, diarrhea and constipation.  Musculoskeletal: Negative for myalgias and arthralgias.  Skin: Negative for color change and rash.  Hematological: Negative for adenopathy. Does not bruise/bleed easily.  Psychiatric/Behavioral: Negative for sleep disturbance and dysphoric mood. The patient is not nervous/anxious.        Objective:    BP 125/81 mmHg  Pulse 75  Temp(Src) 97.7 F (36.5 C) (Oral)  Ht 5' 0.5" (1.537 m)  Wt 181 lb 2 oz (82.158 kg)  BMI 34.78 kg/m2  SpO2 96% Physical Exam  Constitutional: She is oriented to person, place, and time. She appears well-developed and well-nourished. No distress.  HENT:  Head: Normocephalic and atraumatic.  Right Ear: External ear normal.  Left Ear: External ear normal.  Nose: Nose normal.  Mouth/Throat: Oropharynx is clear and moist. No oropharyngeal exudate.  Eyes: Conjunctivae are normal. Pupils are equal, round, and reactive to light. Right eye exhibits no discharge. Left eye exhibits no discharge. No scleral icterus.  Neck: Normal range of motion. Neck supple. No tracheal deviation present. No thyromegaly present.  Cardiovascular: Normal rate, regular rhythm, normal heart sounds and intact distal pulses.  Exam reveals no gallop and no friction rub.   No murmur heard. Pulmonary/Chest: Effort normal and breath sounds normal. No respiratory distress. She has no wheezes. She has no rales. She exhibits no tenderness.  Musculoskeletal: Normal range of motion. She exhibits no edema or tenderness.  Lymphadenopathy:    She has no cervical adenopathy.  Neurological: She is alert and oriented to person, place, and time. No cranial nerve deficit. She exhibits normal muscle tone. Coordination normal.  Skin: Skin is warm and dry. No rash noted. She is not diaphoretic. No erythema. No pallor.  Psychiatric: She  has a normal mood and affect. Her behavior is normal. Judgment and thought content normal.          Assessment & Plan:   Problem List Items Addressed This Visit      Unprioritized   Abdominal pain, epigastric   Relevant Medications   pantoprazole (PROTONIX) 40 MG tablet   Hypertension   Relevant Medications   furosemide (LASIX) 20 MG tablet   Hypothyroidism   Relevant Medications   levothyroxine (SYNTHROID, LEVOTHROID) 100 MCG tablet   Other Relevant Orders   TSH   Medicare annual wellness visit, subsequent - Primary    General medical exam normal today. Pt declines breast exam. PAP and pelvic deferred given age and preference. Mammogram ordered and scheduled. Colonoscopy UTD. Flu vaccine today. Labs today including CBC, CMP, lipids, TSH. Follow up in 6 months and prn. Encouraged healthy diet and exercise.        Relevant Orders   CBC with Differential/Platelet   Comprehensive metabolic panel   Lipid panel       Return in about 6 months (around 05/09/2016) for Recheck.

## 2015-11-09 NOTE — Patient Instructions (Signed)
Health Maintenance, Female Adopting a healthy lifestyle and getting preventive care can go a long way to promote health and wellness. Talk with your health care provider about what schedule of regular examinations is right for you. This is a good chance for you to check in with your provider about disease prevention and staying healthy. In between checkups, there are plenty of things you can do on your own. Experts have done a lot of research about which lifestyle changes and preventive measures are most likely to keep you healthy. Ask your health care provider for more information. WEIGHT AND DIET  Eat a healthy diet  Be sure to include plenty of vegetables, fruits, low-fat dairy products, and lean protein.  Do not eat a lot of foods high in solid fats, added sugars, or salt.  Get regular exercise. This is one of the most important things you can do for your health.  Most adults should exercise for at least 150 minutes each week. The exercise should increase your heart rate and make you sweat (moderate-intensity exercise).  Most adults should also do strengthening exercises at least twice a week. This is in addition to the moderate-intensity exercise.  Maintain a healthy weight  Body mass index (BMI) is a measurement that can be used to identify possible weight problems. It estimates body fat based on height and weight. Your health care provider can help determine your BMI and help you achieve or maintain a healthy weight.  For females 20 years of age and older:   A BMI below 18.5 is considered underweight.  A BMI of 18.5 to 24.9 is normal.  A BMI of 25 to 29.9 is considered overweight.  A BMI of 30 and above is considered obese.  Watch levels of cholesterol and blood lipids  You should start having your blood tested for lipids and cholesterol at 72 years of age, then have this test every 5 years.  You may need to have your cholesterol levels checked more often if:  Your lipid  or cholesterol levels are high.  You are older than 72 years of age.  You are at high risk for heart disease.  CANCER SCREENING   Lung Cancer  Lung cancer screening is recommended for adults 55-80 years old who are at high risk for lung cancer because of a history of smoking.  A yearly low-dose CT scan of the lungs is recommended for people who:  Currently smoke.  Have quit within the past 15 years.  Have at least a 30-pack-year history of smoking. A pack year is smoking an average of one pack of cigarettes a day for 1 year.  Yearly screening should continue until it has been 15 years since you quit.  Yearly screening should stop if you develop a health problem that would prevent you from having lung cancer treatment.  Breast Cancer  Practice breast self-awareness. This means understanding how your breasts normally appear and feel.  It also means doing regular breast self-exams. Let your health care provider know about any changes, no matter how small.  If you are in your 20s or 30s, you should have a clinical breast exam (CBE) by a health care provider every 1-3 years as part of a regular health exam.  If you are 40 or older, have a CBE every year. Also consider having a breast X-ray (mammogram) every year.  If you have a family history of breast cancer, talk to your health care provider about genetic screening.  If you   are at high risk for breast cancer, talk to your health care provider about having an MRI and a mammogram every year.  Breast cancer gene (BRCA) assessment is recommended for women who have family members with BRCA-related cancers. BRCA-related cancers include:  Breast.  Ovarian.  Tubal.  Peritoneal cancers.  Results of the assessment will determine the need for genetic counseling and BRCA1 and BRCA2 testing. Cervical Cancer Your health care provider may recommend that you be screened regularly for cancer of the pelvic organs (ovaries, uterus, and  vagina). This screening involves a pelvic examination, including checking for microscopic changes to the surface of your cervix (Pap test). You may be encouraged to have this screening done every 3 years, beginning at age 21.  For women ages 30-65, health care providers may recommend pelvic exams and Pap testing every 3 years, or they may recommend the Pap and pelvic exam, combined with testing for human papilloma virus (HPV), every 5 years. Some types of HPV increase your risk of cervical cancer. Testing for HPV may also be done on women of any age with unclear Pap test results.  Other health care providers may not recommend any screening for nonpregnant women who are considered low risk for pelvic cancer and who do not have symptoms. Ask your health care provider if a screening pelvic exam is right for you.  If you have had past treatment for cervical cancer or a condition that could lead to cancer, you need Pap tests and screening for cancer for at least 20 years after your treatment. If Pap tests have been discontinued, your risk factors (such as having a new sexual partner) need to be reassessed to determine if screening should resume. Some women have medical problems that increase the chance of getting cervical cancer. In these cases, your health care provider may recommend more frequent screening and Pap tests. Colorectal Cancer  This type of cancer can be detected and often prevented.  Routine colorectal cancer screening usually begins at 72 years of age and continues through 72 years of age.  Your health care provider may recommend screening at an earlier age if you have risk factors for colon cancer.  Your health care provider may also recommend using home test kits to check for hidden blood in the stool.  A small camera at the end of a tube can be used to examine your colon directly (sigmoidoscopy or colonoscopy). This is done to check for the earliest forms of colorectal  cancer.  Routine screening usually begins at age 50.  Direct examination of the colon should be repeated every 5-10 years through 72 years of age. However, you may need to be screened more often if early forms of precancerous polyps or small growths are found. Skin Cancer  Check your skin from head to toe regularly.  Tell your health care provider about any new moles or changes in moles, especially if there is a change in a mole's shape or color.  Also tell your health care provider if you have a mole that is larger than the size of a pencil eraser.  Always use sunscreen. Apply sunscreen liberally and repeatedly throughout the day.  Protect yourself by wearing long sleeves, pants, a wide-brimmed hat, and sunglasses whenever you are outside. HEART DISEASE, DIABETES, AND HIGH BLOOD PRESSURE   High blood pressure causes heart disease and increases the risk of stroke. High blood pressure is more likely to develop in:  People who have blood pressure in the high end   of the normal range (130-139/85-89 mm Hg).  People who are overweight or obese.  People who are African American.  If you are 38-23 years of age, have your blood pressure checked every 3-5 years. If you are 61 years of age or older, have your blood pressure checked every year. You should have your blood pressure measured twice--once when you are at a hospital or clinic, and once when you are not at a hospital or clinic. Record the average of the two measurements. To check your blood pressure when you are not at a hospital or clinic, you can use:  An automated blood pressure machine at a pharmacy.  A home blood pressure monitor.  If you are between 45 years and 39 years old, ask your health care provider if you should take aspirin to prevent strokes.  Have regular diabetes screenings. This involves taking a blood sample to check your fasting blood sugar level.  If you are at a normal weight and have a low risk for diabetes,  have this test once every three years after 72 years of age.  If you are overweight and have a high risk for diabetes, consider being tested at a younger age or more often. PREVENTING INFECTION  Hepatitis B  If you have a higher risk for hepatitis B, you should be screened for this virus. You are considered at high risk for hepatitis B if:  You were born in a country where hepatitis B is common. Ask your health care provider which countries are considered high risk.  Your parents were born in a high-risk country, and you have not been immunized against hepatitis B (hepatitis B vaccine).  You have HIV or AIDS.  You use needles to inject street drugs.  You live with someone who has hepatitis B.  You have had sex with someone who has hepatitis B.  You get hemodialysis treatment.  You take certain medicines for conditions, including cancer, organ transplantation, and autoimmune conditions. Hepatitis C  Blood testing is recommended for:  Everyone born from [birth year] through 1963.  Anyone with known risk factors for hepatitis C. Sexually transmitted infections (STIs)  You should be screened for sexually transmitted infections (STIs) including gonorrhea and chlamydia if:  You are sexually active and are younger than 72 years of age.  You are older than 72 years of age and your health care provider tells you that you are at risk for this type of infection.  Your sexual activity has changed since you were last screened and you are at an increased risk for chlamydia or gonorrhea. Ask your health care provider if you are at risk.  If you do not have HIV, but are at risk, it may be recommended that you take a prescription medicine daily to prevent HIV infection. This is called pre-exposure prophylaxis (PrEP). You are considered at risk if:  You are sexually active and do not regularly use condoms or know the HIV status of your partner(s).  You take drugs by injection.  You are sexually  active with a partner who has HIV. Talk with your health care provider about whether you are at high risk of being infected with HIV. If you choose to begin PrEP, you should first be tested for HIV. You should then be tested every 3 months for as long as you are taking PrEP.  PREGNANCY   If you are premenopausal and you may become pregnant, ask your health care provider about preconception counseling.  If you may  become pregnant, take 400 to 800 micrograms (mcg) of folic acid every day.  If you want to prevent pregnancy, talk to your health care provider about birth control (contraception). OSTEOPOROSIS AND MENOPAUSE   Osteoporosis is a disease in which the bones lose minerals and strength with aging. This can result in serious bone fractures. Your risk for osteoporosis can be identified using a bone density scan.  If you are 61 years of age or older, or if you are at risk for osteoporosis and fractures, ask your health care provider if you should be screened.  Ask your health care provider whether you should take a calcium or vitamin D supplement to lower your risk for osteoporosis.  Menopause may have certain physical symptoms and risks.  Hormone replacement therapy may reduce some of these symptoms and risks. Talk to your health care provider about whether hormone replacement therapy is right for you.  HOME CARE INSTRUCTIONS   Schedule regular health, dental, and eye exams.  Stay current with your immunizations.   Do not use any tobacco products including cigarettes, chewing tobacco, or electronic cigarettes.  If you are pregnant, do not drink alcohol.  If you are breastfeeding, limit how much and how often you drink alcohol.  Limit alcohol intake to no more than 1 drink per day for nonpregnant women. One drink equals 12 ounces of beer, 5 ounces of wine, or 1 ounces of hard liquor.  Do not use street drugs.  Do not share needles.  Ask your health care provider for help if  you need support or information about quitting drugs.  Tell your health care provider if you often feel depressed.  Tell your health care provider if you have ever been abused or do not feel safe at home.   This information is not intended to replace advice given to you by your health care provider. Make sure you discuss any questions you have with your health care provider.   Document Released: 05/13/2011 Document Revised: 11/18/2014 Document Reviewed: 09/29/2013 Elsevier Interactive Patient Education [employer].

## 2015-11-09 NOTE — Assessment & Plan Note (Signed)
General medical exam normal today. Pt declines breast exam. PAP and pelvic deferred given age and preference. Mammogram ordered and scheduled. Colonoscopy UTD. Flu vaccine today. Labs today including CBC, CMP, lipids, TSH. Follow up in 6 months and prn. Encouraged healthy diet and exercise.

## 2015-11-09 NOTE — Progress Notes (Signed)
Pre visit review using our clinic review tool, if applicable. No additional management support is needed unless otherwise documented below in the visit note. 

## 2015-11-15 ENCOUNTER — Telehealth: Payer: Self-pay | Admitting: Internal Medicine

## 2015-11-15 NOTE — Telephone Encounter (Signed)
error 

## 2015-11-17 ENCOUNTER — Ambulatory Visit
Admission: RE | Admit: 2015-11-17 | Discharge: 2015-11-17 | Disposition: A | Discharge status: Home / Self Care | Payer: Commercial Managed Care - HMO | Source: Ambulatory Visit | Attending: Internal Medicine | Admitting: Internal Medicine

## 2015-11-17 DIAGNOSIS — Z1231 Encounter for screening mammogram for malignant neoplasm of breast: Secondary | ICD-10-CM

## 2015-11-30 ENCOUNTER — Telehealth: Payer: Self-pay | Admitting: Internal Medicine

## 2015-11-30 NOTE — Telephone Encounter (Signed)
Pt lvm stating that she received a denial from [facility] about her mammogram. She would like to speak to someone about it.

## 2015-12-01 NOTE — Telephone Encounter (Signed)
She needs to call to the billing office where she had the mammogram done.

## 2015-12-11 NOTE — Telephone Encounter (Signed)
Pt states that she has not received a bill for her mammogram. States that it was a 3D. She has only received the denial from [facility]. She said that the denial states that the diagnosis/procedure code did not meet the guidelines for "urgent" and that an approved referral is required.  I have tried explaining to her that we do not do referrals for mammogram but she is not understanding what I'm explaining to her. Can you please call her? Thanks!

## 2016-01-09 ENCOUNTER — Telehealth: Payer: Self-pay | Admitting: Internal Medicine

## 2016-01-09 NOTE — Telephone Encounter (Signed)
Patient's wants a copy of her labs form December mailed to her .

## 2016-01-09 NOTE — Telephone Encounter (Signed)
Labs mailed

## 2016-01-10 ENCOUNTER — Ambulatory Visit: Payer: Commercial Managed Care - HMO | Admitting: Internal Medicine

## 2016-01-29 ENCOUNTER — Telehealth: Payer: Self-pay | Admitting: Internal Medicine

## 2016-01-29 DIAGNOSIS — M25551 Pain in right hip: Secondary | ICD-10-CM

## 2016-01-29 NOTE — Telephone Encounter (Signed)
Pt called to stated that she needs a referral to Dr. Francisca December. Pt is having pain in her right hip.

## 2016-03-05 ENCOUNTER — Ambulatory Visit (INDEPENDENT_AMBULATORY_CARE_PROVIDER_SITE_OTHER): Payer: Commercial Managed Care - HMO | Admitting: Internal Medicine

## 2016-03-05 ENCOUNTER — Encounter: Payer: Self-pay | Admitting: Internal Medicine

## 2016-03-05 ENCOUNTER — Other Ambulatory Visit: Payer: Self-pay | Admitting: Internal Medicine

## 2016-03-05 ENCOUNTER — Ambulatory Visit
Admission: RE | Admit: 2016-03-05 | Discharge: 2016-03-05 | Disposition: A | Discharge status: Home / Self Care | Payer: Commercial Managed Care - HMO | Source: Ambulatory Visit | Attending: Internal Medicine | Admitting: Internal Medicine

## 2016-03-05 ENCOUNTER — Telehealth: Payer: Self-pay

## 2016-03-05 VITALS — BP 148/98 | HR 75 | Ht 61.0 in | Wt 182.8 lb

## 2016-03-05 DIAGNOSIS — G8929 Other chronic pain: Secondary | ICD-10-CM

## 2016-03-05 DIAGNOSIS — M5136 Other intervertebral disc degeneration, lumbar region: Secondary | ICD-10-CM | POA: Diagnosis not present

## 2016-03-05 DIAGNOSIS — M545 Low back pain, unspecified: Secondary | ICD-10-CM

## 2016-03-05 DIAGNOSIS — M5137 Other intervertebral disc degeneration, lumbosacral region: Secondary | ICD-10-CM | POA: Diagnosis not present

## 2016-03-05 DIAGNOSIS — M810 Age-related osteoporosis without current pathological fracture: Secondary | ICD-10-CM | POA: Insufficient documentation

## 2016-03-05 MED ORDER — CYCLOBENZAPRINE HCL 5 MG PO TABS: 5.0000 mg | ORAL_TABLET | Freq: Three times a day (TID) | ORAL | Status: DC | PRN

## 2016-03-05 NOTE — Assessment & Plan Note (Signed)
Acute on chronic lumbar back pain. No improvement with PT, chiropractic care, NSAIDS. Will add Flexeril. Plain xray today. Follow up after xray complete.

## 2016-03-05 NOTE — Telephone Encounter (Signed)
Patient aware of results and recommendations. °

## 2016-03-05 NOTE — Patient Instructions (Signed)
Xray of lumbar spine today at [place] 5mg  up to three times daily as needed for pain.

## 2016-03-05 NOTE — Telephone Encounter (Signed)
-----   Message from Jackolyn Confer, MD sent at 03/05/2016  9:01 AM EDT ----- Odette Horns showed degenerative changes. I will order an MRI of the Lumbar spine for further evaluation.

## 2016-03-05 NOTE — Progress Notes (Signed)
Pre visit review using our clinic review tool, if applicable. No additional management support is needed unless otherwise documented below in the visit note. 

## 2016-03-05 NOTE — Progress Notes (Signed)
Subjective:    Patient ID: Cathy Coffey, female    DOB: [birth year], 75 y.o.   MRN: [id number]  HPI  75YO female presents for acute visit.  Back pain - Ongoing off and on for years. Seen by chiropractor and PT. Told her right hip had "slipped." Taking Ibuprofen. Pain however, has been severe, limiting activities. Also used Cathy Coffey at times.  Continues to have aching pain mid back, occasionally radiates to both sides.  No weakness or numbness.  Wt Readings from Last 3 Encounters:  03/05/16 182 lb 12.8 oz (82.918 kg)  11/09/15 181 lb 2 oz (82.158 kg)  03/15/15 186 lb (84.369 kg)   BP Readings from Last 3 Encounters:  03/05/16 148/98  11/09/15 125/81  04/14/15 109/75    Past Medical History  Diagnosis Date  . Asthma   . Hypothyroidism   . Hypertension   . Lichen planus   . Diverticulosis   . IBS (irritable bowel syndrome)   . Pancreatitis due to common bile duct stone 2000   Family History  Problem Relation Age of Onset  . Hypothyroidism Mother    Past Surgical History  Procedure Laterality Date  . Cholecystectomy    . Hernia repair    . Tubal ligation    . Colonoscopy  2011   Social History   Social History  . Marital Status: Divorced    Spouse Name: N/A  . Number of Children: N/A  . Years of Education: N/A   Social History Main Topics  . Smoking status: Never Smoker   . Smokeless tobacco: Never Used  . Alcohol Use: No  . Drug Use: No  . Sexual Activity: Not Asked   Other Topics Concern  . None   Social History Narrative    Review of Systems  Constitutional: Negative for fever, chills, appetite change, fatigue and unexpected weight change.  Eyes: Negative for visual disturbance.  Respiratory: Negative for shortness of breath.   Cardiovascular: Negative for chest pain and leg swelling.  Gastrointestinal: Negative for abdominal pain.  Musculoskeletal: Positive for myalgias, back pain and arthralgias.  Skin: Negative for color change and rash.    Neurological: Negative for dizziness, weakness, light-headedness, numbness and headaches.  Hematological: Negative for adenopathy. Does not bruise/bleed easily.  Psychiatric/Behavioral: Positive for sleep disturbance. Negative for dysphoric mood. The patient is not nervous/anxious.        Objective:    BP 148/98 mmHg  Pulse 75  Ht 5\' 1"  (1.549 m)  Wt 182 lb 12.8 oz (82.918 kg)  BMI 34.56 kg/m2  SpO2 98% Physical Exam  Constitutional: She is oriented to person, place, and time. She appears well-developed and well-nourished. No distress.  HENT:  Head: Normocephalic and atraumatic.  Right Ear: External ear normal.  Left Ear: External ear normal.  Nose: Nose normal.  Mouth/Throat: Oropharynx is clear and moist. No oropharyngeal exudate.  Eyes: Conjunctivae are normal. Pupils are equal, round, and reactive to light. Right eye exhibits no discharge. Left eye exhibits no discharge. No scleral icterus.  Neck: Normal range of motion. Neck supple. No tracheal deviation present. No thyromegaly present.  Cardiovascular: Normal rate, regular rhythm, normal heart sounds and intact distal pulses.  Exam reveals no gallop and no friction rub.   No murmur heard. Pulmonary/Chest: Effort normal and breath sounds normal. No respiratory distress. She has no wheezes. She has no rales. She exhibits no tenderness.  Musculoskeletal: She exhibits no edema.       Lumbar back: She exhibits decreased  range of motion and tenderness. She exhibits no bony tenderness and no spasm.  Lymphadenopathy:    She has no cervical adenopathy.  Neurological: She is alert and oriented to person, place, and time. No cranial nerve deficit. She exhibits normal muscle tone. Coordination normal.  Skin: Skin is warm and dry. No rash noted. She is not diaphoretic. No erythema. No pallor.  Psychiatric: She has a normal mood and affect. Her behavior is normal. Judgment and thought content normal.          Assessment & Plan:    Problem List Items Addressed This Visit      Unprioritized   Lumbago - Primary    Acute on chronic lumbar back pain. No improvement with PT, chiropractic care, NSAIDS. Will add Flexeril. Plain xray today. Follow up after xray complete.      Relevant Medications   cyclobenzaprine (FLEXERIL) 5 MG tablet   Other Relevant Orders   DG Lumbar Spine Complete       Return in about 2 weeks (around 03/19/2016) for Recheck.  Ronette Deter, MD Internal Medicine [facility]

## 2016-03-13 ENCOUNTER — Telehealth: Payer: Self-pay | Admitting: Internal Medicine

## 2016-03-13 NOTE — Telephone Encounter (Signed)
April [phone] called [facility] regarding pt needing more authorizations. Please if the auth can be faxed to [phone]. Thank you!

## 2016-03-13 NOTE — Telephone Encounter (Signed)
Please advise if you can assist or does Dr. Gilford Rile need to complete something?

## 2016-03-25 ENCOUNTER — Ambulatory Visit
Admission: RE | Admit: 2016-03-25 | Discharge: 2016-03-25 | Disposition: A | Discharge status: Home / Self Care | Payer: Commercial Managed Care - HMO | Source: Ambulatory Visit | Attending: Internal Medicine | Admitting: Internal Medicine

## 2016-03-25 ENCOUNTER — Telehealth: Payer: Self-pay

## 2016-03-25 DIAGNOSIS — M47816 Spondylosis without myelopathy or radiculopathy, lumbar region: Secondary | ICD-10-CM | POA: Insufficient documentation

## 2016-03-25 DIAGNOSIS — M419 Scoliosis, unspecified: Secondary | ICD-10-CM | POA: Insufficient documentation

## 2016-03-25 DIAGNOSIS — M545 Low back pain: Secondary | ICD-10-CM | POA: Insufficient documentation

## 2016-03-25 DIAGNOSIS — M4806 Spinal stenosis, lumbar region: Secondary | ICD-10-CM | POA: Insufficient documentation

## 2016-03-25 DIAGNOSIS — G8929 Other chronic pain: Secondary | ICD-10-CM | POA: Insufficient documentation

## 2016-03-25 DIAGNOSIS — M5146 Schmorl's nodes, lumbar region: Secondary | ICD-10-CM | POA: Diagnosis not present

## 2016-03-25 NOTE — Telephone Encounter (Signed)
Left message for patient to call office regarding lab results.

## 2016-03-25 NOTE — Telephone Encounter (Signed)
-----   Message from Jackolyn Confer, MD sent at 03/25/2016  1:25 PM EDT ----- MRI of lumbar spine showed lumbar spinal stenosis and some signs of inflammation at L2-3 which may be causing pain. I would recommend evaluation with Dr. Leanord Hawking at [facility]. Please ask her if she is willing to see him.

## 2016-03-26 ENCOUNTER — Telehealth: Payer: Self-pay

## 2016-03-26 DIAGNOSIS — M48061 Spinal stenosis, lumbar region without neurogenic claudication: Secondary | ICD-10-CM

## 2016-03-26 NOTE — Telephone Encounter (Signed)
-----   Message from Jackolyn Confer, MD sent at 03/25/2016  1:25 PM EDT ----- MRI of lumbar spine showed lumbar spinal stenosis and some signs of inflammation at L2-3 which may be causing pain. I would recommend evaluation with Dr. Leanord Hawking at [facility]. Please ask her if she is willing to see him.

## 2016-03-26 NOTE — Telephone Encounter (Signed)
Informed patient of MRI results.  Referral placed.  Patient understands.

## 2016-04-11 ENCOUNTER — Ambulatory Visit: Payer: Commercial Managed Care - HMO | Admitting: Internal Medicine

## 2016-05-13 ENCOUNTER — Encounter: Payer: Self-pay | Admitting: Internal Medicine

## 2016-05-13 ENCOUNTER — Ambulatory Visit (INDEPENDENT_AMBULATORY_CARE_PROVIDER_SITE_OTHER): Payer: Commercial Managed Care - HMO | Admitting: Internal Medicine

## 2016-05-13 VITALS — BP 158/94 | HR 70 | Ht 61.0 in | Wt 181.8 lb

## 2016-05-13 DIAGNOSIS — I10 Essential (primary) hypertension: Secondary | ICD-10-CM

## 2016-05-13 DIAGNOSIS — N952 Postmenopausal atrophic vaginitis: Secondary | ICD-10-CM | POA: Diagnosis not present

## 2016-05-13 LAB — COMPREHENSIVE METABOLIC PANEL
ALT: 11 U/L (ref 0–35)
AST: 13 U/L (ref 0–37)
Albumin: 4.1 g/dL (ref 3.5–5.2)
Alkaline Phosphatase: 89 U/L (ref 39–117)
BILIRUBIN TOTAL: 0.3 mg/dL (ref 0.2–1.2)
BUN: 21 mg/dL (ref 6–23)
CALCIUM: 9.3 mg/dL (ref 8.4–10.5)
CHLORIDE: 106 meq/L (ref 96–112)
CO2: 30 meq/L (ref 19–32)
Creatinine, Ser: 0.88 mg/dL (ref 0.40–1.20)
GFR: 66.97 mL/min (ref >60.00)
Glucose, Bld: 103 mg/dL — ABNORMAL HIGH (ref 70–99)
POTASSIUM: 3.5 meq/L (ref 3.5–5.1)
Sodium: 140 mEq/L (ref 135–145)
Total Protein: 7.1 g/dL (ref 6.0–8.3)

## 2016-05-13 MED ORDER — AMLODIPINE BESYLATE 2.5 MG PO TABS: 2.5000 mg | ORAL_TABLET | Freq: Every day | ORAL | Status: DC

## 2016-05-13 MED ORDER — ESTRADIOL 0.1 MG/GM VA CREA: 1.0000 | TOPICAL_CREAM | Freq: Every day | VAGINAL | Status: DC

## 2016-05-13 NOTE — Progress Notes (Signed)
Subjective:    Patient ID: Cathy Coffey, female    DOB: 02/01/1941, 75 y.o.   MRN: [id number]  HPI  75YO female presents for follow up.  HTN - BP has been elevated last couple of months. 130s/90s at home. Having some headache. Took occasional Hydralazine 25mg , her father's medication. No new meds or supplements. Takes occasional Ibuprofen.   Vaginal dryness - Previously used Estrace to help with vaginal dryness and urinary incontinence. Needs refill on Estrace.   Wt Readings from Last 3 Encounters:  05/13/16 181 lb 12.8 oz (82.464 kg)  03/05/16 182 lb 12.8 oz (82.918 kg)  11/09/15 181 lb 2 oz (82.158 kg)   BP Readings from Last 3 Encounters:  05/13/16 158/94  03/05/16 148/98  11/09/15 125/81    Past Medical History  Diagnosis Date  . Asthma   . Hypothyroidism   . Hypertension   . Lichen planus   . Diverticulosis   . IBS (irritable bowel syndrome)   . Pancreatitis due to common bile duct stone 2000   Family History  Problem Relation Age of Onset  . Hypothyroidism Mother    Past Surgical History  Procedure Laterality Date  . Cholecystectomy    . Hernia repair    . Tubal ligation    . Colonoscopy  2011   Social History   Social History  . Marital Status: Divorced    Spouse Name: N/A  . Number of Children: N/A  . Years of Education: N/A   Social History Main Topics  . Smoking status: Never Smoker   . Smokeless tobacco: Never Used  . Alcohol Use: No  . Drug Use: No  . Sexual Activity: Not Asked   Other Topics Concern  . None   Social History Narrative    Review of Systems  Constitutional: Negative for fever, chills, appetite change, fatigue and unexpected weight change.  Eyes: Negative for visual disturbance.  Respiratory: Negative for shortness of breath.   Cardiovascular: Negative for chest pain and leg swelling.  Gastrointestinal: Negative for abdominal pain, diarrhea and constipation.  Genitourinary: Negative for dysuria, urgency,  frequency, flank pain, decreased urine volume, vaginal discharge, genital sores, vaginal pain and pelvic pain.  Skin: Negative for color change and rash.  Neurological: Positive for headaches. Negative for syncope and weakness.  Hematological: Negative for adenopathy. Does not bruise/bleed easily.  Psychiatric/Behavioral: Negative for sleep disturbance and dysphoric mood. The patient is not nervous/anxious.        Objective:    BP 158/94 mmHg  Pulse 70  Ht 5\' 1"  (1.549 m)  Wt 181 lb 12.8 oz (82.464 kg)  BMI 34.37 kg/m2  SpO2 97% Physical Exam  Constitutional: She is oriented to person, place, and time. She appears well-developed and well-nourished. No distress.  HENT:  Head: Normocephalic and atraumatic.  Right Ear: External ear normal.  Left Ear: External ear normal.  Nose: Nose normal.  Mouth/Throat: Oropharynx is clear and moist. No oropharyngeal exudate.  Eyes: Conjunctivae are normal. Pupils are equal, round, and reactive to light. Right eye exhibits no discharge. Left eye exhibits no discharge. No scleral icterus.  Neck: Normal range of motion. Neck supple. No tracheal deviation present. No thyromegaly present.  Cardiovascular: Normal rate, regular rhythm, normal heart sounds and intact distal pulses.  Exam reveals no gallop and no friction rub.   No murmur heard. Pulmonary/Chest: Effort normal and breath sounds normal. No respiratory distress. She has no wheezes. She has no rales. She exhibits no tenderness.  Musculoskeletal:  Normal range of motion. She exhibits no edema or tenderness.  Lymphadenopathy:    She has no cervical adenopathy.  Neurological: She is alert and oriented to person, place, and time. No cranial nerve deficit. She exhibits normal muscle tone. Coordination normal.  Skin: Skin is warm and dry. No rash noted. She is not diaphoretic. No erythema. No pallor.  Psychiatric: She has a normal mood and affect. Her behavior is normal. Judgment and thought content  normal.          Assessment & Plan:   Problem List Items Addressed This Visit      Unprioritized   Atrophic vaginitis    Will restart Estrace. She understands potential risks and benefits of this medication.      Hypertension - Primary    BP Readings from Last 3 Encounters:  05/13/16 158/94  03/05/16 148/98  11/09/15 125/81   BP elevated on last 2 checks with some elevated readings at home. Will start Amlodipine 2.5mg  daily. Discussed potential risks/benefits of this medication. Renal function with labs.      Relevant Medications   amLODipine (NORVASC) 2.5 MG tablet   Other Relevant Orders   Comprehensive metabolic panel       Return in about 3 weeks (around 06/03/2016) for Recheck.  Ronette Deter, MD Internal Medicine [facility]

## 2016-05-13 NOTE — Progress Notes (Signed)
Pre visit review using our clinic review tool, if applicable. No additional management support is needed unless otherwise documented below in the visit note. 

## 2016-05-13 NOTE — Assessment & Plan Note (Signed)
BP Readings from Last 3 Encounters:  05/13/16 158/94  03/05/16 148/98  11/09/15 125/81   BP elevated on last 2 checks with some elevated readings at home. Will start Amlodipine 2.5mg  daily. Discussed potential risks/benefits of this medication. Renal function with labs.

## 2016-05-13 NOTE — Patient Instructions (Signed)
Labs today.  Start Amlodipine 2.5mg  daily.

## 2016-05-13 NOTE — Assessment & Plan Note (Signed)
Will restart Estrace. She understands potential risks and benefits of this medication.

## 2016-05-17 ENCOUNTER — Telehealth: Payer: Self-pay | Admitting: *Deleted

## 2016-05-17 NOTE — Telephone Encounter (Signed)
Patient stated that [facility] faxed over in sheet in reference to medication estradiol and amlodipine. She stated that information on the medications were requested ,she was not sure of information needed.

## 2016-05-17 NOTE — Telephone Encounter (Signed)
We have not received it as of yet.

## 2016-05-17 NOTE — Telephone Encounter (Signed)
Was this received?

## 2016-05-17 NOTE — Telephone Encounter (Signed)
Please advise patient.  

## 2016-05-21 NOTE — Telephone Encounter (Signed)
Spoken to patient, patient stated that her pharmacy is needing a prior authorization for her medication.  Medication should be there at the end this week per the patient.

## 2016-05-21 NOTE — Telephone Encounter (Signed)
Patient stated that [facility] did not receive the request the Rx's

## 2016-05-21 NOTE — Telephone Encounter (Signed)
Please advise was this completed?

## 2016-05-22 ENCOUNTER — Other Ambulatory Visit: Payer: Self-pay

## 2016-05-22 MED ORDER — ESTRADIOL 0.1 MG/GM VA CREA: 1.0000 | TOPICAL_CREAM | Freq: Every day | VAGINAL | Status: DC

## 2016-05-22 NOTE — Telephone Encounter (Signed)
Medication refilled

## 2016-05-22 NOTE — Telephone Encounter (Signed)
Pt called stating [employer] needs the instructions for how pt should use the cream. Fax to [phone]. Thank you!

## 2016-05-22 NOTE — Telephone Encounter (Signed)
I have printed out medication script with instructions, Awaiting signature from Dr. Gilford Rile, then script will be faxed

## 2016-05-22 NOTE — Telephone Encounter (Signed)
It has not.  [employer] is trying to fax over information or prior authorization for the two medications.  I have instructed patient to have information directly faxed to [phone]. Pharmacy has received the initial requests on [id number].

## 2016-05-22 NOTE — Telephone Encounter (Signed)
Medication Script and directions have been Faxed to [facility]

## 2016-05-22 NOTE — Telephone Encounter (Signed)
Please advise, thanks.

## 2016-06-03 ENCOUNTER — Encounter: Payer: Self-pay | Admitting: Internal Medicine

## 2016-06-03 ENCOUNTER — Ambulatory Visit (INDEPENDENT_AMBULATORY_CARE_PROVIDER_SITE_OTHER): Payer: Commercial Managed Care - HMO | Admitting: Internal Medicine

## 2016-06-03 VITALS — BP 124/72 | HR 87 | Wt 182.4 lb

## 2016-06-03 DIAGNOSIS — I10 Essential (primary) hypertension: Secondary | ICD-10-CM

## 2016-06-03 DIAGNOSIS — L259 Unspecified contact dermatitis, unspecified cause: Secondary | ICD-10-CM | POA: Diagnosis not present

## 2016-06-03 MED ORDER — DESONIDE 0.05 % EX CREA: TOPICAL_CREAM | Freq: Two times a day (BID) | CUTANEOUS | 0 refills | Status: DC

## 2016-06-03 NOTE — Progress Notes (Signed)
Subjective:    Patient ID: DEZLYNN KUHNKE, female    DOB: 07-Jun-1941, 75 y.o.   MRN: [id number]  HPI  75YO female presents for follow up.  Recently seen for elevated BP. Started on Amlodipine.  HTN - BP has been well controlled at home. Compliant with medication. Taking Amlodipine 2.5mg  daily for 7 days.  Wt Readings from Last 3 Encounters:  06/03/16 182 lb 6.4 oz (82.7 kg)  05/13/16 181 lb 12.8 oz (82.5 kg)  03/05/16 182 lb 12.8 oz (82.9 kg)   BP Readings from Last 3 Encounters:  06/03/16 124/72  05/13/16 (!) 158/94  03/05/16 (!) 148/98    Past Medical History:  Diagnosis Date  . Asthma   . Diverticulosis   . Hypertension   . Hypothyroidism   . IBS (irritable bowel syndrome)   . Lichen planus   . Pancreatitis due to common bile duct stone 2000   Family History  Problem Relation Age of Onset  . Hypothyroidism Mother    Past Surgical History:  Procedure Laterality Date  . CHOLECYSTECTOMY    . COLONOSCOPY  2011  . HERNIA REPAIR    . TUBAL LIGATION     Social History   Social History  . Marital status: Divorced    Spouse name: N/A  . Number of children: N/A  . Years of education: N/A   Social History Main Topics  . Smoking status: Never Smoker  . Smokeless tobacco: Never Used  . Alcohol use No  . Drug use: No  . Sexual activity: Not Asked   Other Topics Concern  . None   Social History Narrative  . None    Review of Systems  Constitutional: Negative for appetite change, chills, fatigue, fever and unexpected weight change.  Eyes: Negative for visual disturbance.  Respiratory: Negative for cough and shortness of breath.   Cardiovascular: Negative for chest pain, palpitations and leg swelling.  Gastrointestinal: Negative for abdominal pain.  Skin: Negative for color change and rash.  Hematological: Negative for adenopathy. Does not bruise/bleed easily.  Psychiatric/Behavioral: Negative for dysphoric mood. The patient is not nervous/anxious.         Objective:    BP 124/72 (BP Location: Left Arm, Patient Position: Sitting, Cuff Size: Large)   Pulse 87   Wt 182 lb 6.4 oz (82.7 kg)   BMI 34.46 kg/m  Physical Exam  Constitutional: She is oriented to person, place, and time. She appears well-developed and well-nourished. No distress.  HENT:  Head: Normocephalic and atraumatic.  Right Ear: External ear normal.  Left Ear: External ear normal.  Nose: Nose normal.  Mouth/Throat: Oropharynx is clear and moist. No oropharyngeal exudate.  Eyes: Conjunctivae are normal. Pupils are equal, round, and reactive to light. Right eye exhibits no discharge. Left eye exhibits no discharge. No scleral icterus.  Neck: Normal range of motion. Neck supple. No tracheal deviation present. No thyromegaly present.  Cardiovascular: Normal rate, regular rhythm, normal heart sounds and intact distal pulses.  Exam reveals no gallop and no friction rub.   No murmur heard. Pulmonary/Chest: Effort normal and breath sounds normal. No respiratory distress. She has no wheezes. She has no rales. She exhibits no tenderness.  Musculoskeletal: Normal range of motion. She exhibits no edema or tenderness.  Lymphadenopathy:    She has no cervical adenopathy.  Neurological: She is alert and oriented to person, place, and time. No cranial nerve deficit. She exhibits normal muscle tone. Coordination normal.  Skin: Skin is warm and dry.  No rash noted. She is not diaphoretic. No erythema. No pallor.  Psychiatric: She has a normal mood and affect. Her behavior is normal. Judgment and thought content normal.          Assessment & Plan:   Problem List Items Addressed This Visit      Unprioritized   Hypertension - Primary (Chronic)    BP Readings from Last 3 Encounters:  06/03/16 124/72  05/13/16 (!) 158/94  03/05/16 (!) 148/98   BP improved with Amlodipine. Will plan for follow up with new provider in 4 weeks. Continue current medications.       Other Visit  Diagnoses    Contact dermatitis       Relevant Medications   desonide (DESOWEN) 0.05 % cream       Return in about 4 weeks (around 07/01/2016) for New Patient.  Ronette Deter, MD Internal Medicine [facility]

## 2016-06-03 NOTE — Assessment & Plan Note (Signed)
BP Readings from Last 3 Encounters:  06/03/16 124/72  05/13/16 (!) 158/94  03/05/16 (!) 148/98   BP improved with Amlodipine. Will plan for follow up with new provider in 4 weeks. Continue current medications.

## 2016-06-03 NOTE — Patient Instructions (Signed)
Follow up as new patient with Dr. Caryl Bis.

## 2016-07-18 ENCOUNTER — Ambulatory Visit: Payer: Commercial Managed Care - HMO | Admitting: Family Medicine

## 2016-08-21 ENCOUNTER — Ambulatory Visit (INDEPENDENT_AMBULATORY_CARE_PROVIDER_SITE_OTHER): Payer: Commercial Managed Care - HMO | Admitting: Family Medicine

## 2016-08-21 ENCOUNTER — Encounter: Payer: Self-pay | Admitting: Family Medicine

## 2016-08-21 VITALS — BP 136/78 | HR 74 | Temp 98.1°F | Wt 183.6 lb

## 2016-08-21 DIAGNOSIS — E039 Hypothyroidism, unspecified: Secondary | ICD-10-CM | POA: Diagnosis not present

## 2016-08-21 DIAGNOSIS — L03011 Cellulitis of right finger: Secondary | ICD-10-CM | POA: Insufficient documentation

## 2016-08-21 DIAGNOSIS — Z23 Encounter for immunization: Secondary | ICD-10-CM

## 2016-08-21 LAB — TSH: TSH: 0.51 u[IU]/mL (ref 0.35–4.50)

## 2016-08-21 MED ORDER — TRAMADOL HCL 50 MG PO TABS: 50.0000 mg | ORAL_TABLET | Freq: Three times a day (TID) | ORAL | 0 refills | Status: DC | PRN

## 2016-08-21 NOTE — Patient Instructions (Signed)
Nice to meet you. Please monitor your right middle finger and if this worsens please let us know. We'll try tramadol for your back pain. We will check your thyroid today and call with the results. If you develop numbness, weakness, numbness between her legs, change in bowel or bladder function, or any new or changing symptoms sig medical attention.

## 2016-08-21 NOTE — Assessment & Plan Note (Addendum)
Appears to have resolved with Epsom salts soaks and drainage. She'll continue to monitor and if there is recurrence she'll let us know.

## 2016-08-21 NOTE — Progress Notes (Signed)
Cathy Rumps, MD Phone: [phone]  Cathy Coffey is a 75 y.o. female who presents today for follow-up.  Patient notes she had swelling and pain and redness along the right ulnar aspect of her middle finger of the right hand. Notes it was sore. She soaked it with Epsom salts and it came to head and she lanced it. It drained pus. Has resolved. No erythema. No pain.  Spinal stenosis: Patient notes if she is overly active she gets lots of pain. Notes she has chronic tingliness and numbness in her bilateral feet related to this. No weakness. No saddle anesthesia. No bowel incontinence. She notes at times when she has the discomfort she will have some urge incontinence though no incontinence at any other time. Notes really only occurs when she would typically go to the bathroom. Notes she will feel an urge and if she can not get to the bathroom quickly enough she will urinate on herself. This has been going on for the last year. She notes when she does not have a flare of her back pain this is not an issue. No issues with this currently. Prior MRI reviewed.  HYPOTHYROIDISM Disease Monitoring Weight changes: No  Skin Changes: No Heat/Cold intolerance: Some cold intolerance  Medication Monitoring Compliance:  Taking Synthroid   Last TSH:   Lab Results  Component Value Date   TSH 0.54 11/09/2015   Patient additionally notes mild memory issues with forgetting certain words. She notes no issues with her ADLs or IADLs.  PMH: nonsmoker.   ROS see history of present illness  Objective  Physical Exam Vitals:   08/21/16 1447  BP: 136/78  Pulse: 74  Temp: 98.1 F (36.7 C)    BP Readings from Last 3 Encounters:  08/21/16 136/78  06/03/16 124/72  05/13/16 (!) 158/94   Wt Readings from Last 3 Encounters:  08/21/16 183 lb 9.6 oz (83.3 kg)  06/03/16 182 lb 6.4 oz (82.7 kg)  05/13/16 181 lb 12.8 oz (82.5 kg)    Physical Exam  Constitutional: She is well-developed, well-nourished,  and in no distress.  Cardiovascular: Normal rate, regular rhythm and normal heart sounds.   Pulmonary/Chest: Effort normal and breath sounds normal.  Musculoskeletal:  No midline spine tenderness, no midline spine step-off, no muscular back tenderness  Neurological: She is alert. Gait normal.  5 out of 5 strength bilateral quads, hamstrings, plantar flexion, and dorsiflexion, sensation to light touch intact bilateral lower extremities, absent patellar reflexes bilaterally  Skin: Skin is warm and dry.  Right ulnar aspect middle finger with small abrasion, no erythema, warmth, no tenderness, no fluctuance     Assessment/Plan: Please see individual problem list.  Hypothyroidism Check TSH. Continue Synthroid.  Lumbago Patient with no issues currently. She does not want any intervention such as surgery or injections. Neurologically intact. She does report some urge incontinence which has been going on over the last year. Prior MRI with stenosis. No issues with incontinence at this time. Did discuss further workup of her urgency versus treatment for urgency though she declined at this time. Doubt any compressive cause of this. We will try her on tramadol for discomfort. She'll monitor and if she has any changes she'll seek medical attention.  Paronychia of finger of right hand Appears to have resolved with Epsom salts soaks and drainage. She'll continue to monitor and if there is recurrence she'll let us know.   Orders Placed This Encounter  Procedures  . Flu vaccine HIGH DOSE PF  .  TSH    Meds ordered this encounter  Medications  . traMADol (ULTRAM) 50 MG tablet    Sig: Take 1 tablet (50 mg total) by mouth every 8 (eight) hours as needed.    Dispense:  30 tablet    Refill:  0   Cathy Rumps, MD [place]

## 2016-08-21 NOTE — Assessment & Plan Note (Signed)
Check TSH.  Continue Synthroid. 

## 2016-08-21 NOTE — Assessment & Plan Note (Signed)
Patient with no issues currently. She does not want any intervention such as surgery or injections. Neurologically intact. She does report some urge incontinence which has been going on over the last year. Prior MRI with stenosis. No issues with incontinence at this time. Did discuss further workup of her urgency versus treatment for urgency though she declined at this time. Doubt any compressive cause of this. We will try her on tramadol for discomfort. She'll monitor and if she has any changes she'll seek medical attention.

## 2016-08-21 NOTE — Progress Notes (Signed)
Pre visit review using our clinic review tool, if applicable. No additional management support is needed unless otherwise documented below in the visit note. 

## 2016-08-26 ENCOUNTER — Other Ambulatory Visit: Payer: Self-pay | Admitting: Surgical

## 2016-08-26 DIAGNOSIS — R1013 Epigastric pain: Secondary | ICD-10-CM

## 2016-08-26 NOTE — Progress Notes (Signed)
Can we refill these RX

## 2016-08-27 MED ORDER — FUROSEMIDE 20 MG PO TABS: 20.0000 mg | ORAL_TABLET | Freq: Every day | ORAL | 3 refills | Status: DC

## 2016-08-27 MED ORDER — PANTOPRAZOLE SODIUM 40 MG PO TBEC: 40.0000 mg | DELAYED_RELEASE_TABLET | Freq: Two times a day (BID) | ORAL | 0 refills | Status: DC

## 2016-08-27 NOTE — Progress Notes (Signed)
Refill sent to pharmacy. Please call the patient and determine the reason she takes Protonix twice daily. Thanks.

## 2016-09-10 NOTE — Progress Notes (Signed)
LM for patient to return call.

## 2016-09-11 HISTORY — PX: MOUTH SURGERY: SHX715

## 2016-09-13 ENCOUNTER — Telehealth: Payer: Self-pay | Admitting: Family Medicine

## 2016-09-13 MED ORDER — LEVOTHYROXINE SODIUM 100 MCG PO TABS: 100.0000 ug | ORAL_TABLET | Freq: Every day | ORAL | 3 refills | Status: DC

## 2016-09-13 NOTE — Telephone Encounter (Signed)
RX sent to pharmacy  

## 2016-09-13 NOTE — Telephone Encounter (Signed)
Pt called requesting a medication refill on levothyroxine (SYNTHROID, LEVOTHROID) 100 MCG tablet.  [facility] - [place], [place]

## 2016-09-17 ENCOUNTER — Telehealth: Payer: Self-pay | Admitting: Family Medicine

## 2016-09-17 DIAGNOSIS — L439 Lichen planus, unspecified: Secondary | ICD-10-CM

## 2016-09-17 NOTE — Telephone Encounter (Signed)
Pt would like a referral to see the dermatologist for in her mouth. Pt called what has is Lichenplanus. Please advise?  Call pt @ [phone].Thank you!

## 2016-09-17 NOTE — Telephone Encounter (Signed)
Referral will be placed.

## 2016-09-17 NOTE — Telephone Encounter (Signed)
Spoke with patient and she stated that she needs a referral to [facility] dermatology. She has a condition called lichen planus that she has been seen for before by the dermatologist. It is in her mouth under her tongue and very painful. She has had it for about 2 months.

## 2016-10-16 ENCOUNTER — Ambulatory Visit (INDEPENDENT_AMBULATORY_CARE_PROVIDER_SITE_OTHER): Payer: Commercial Managed Care - HMO

## 2016-10-16 VITALS — BP 128/74 | HR 76 | Temp 97.7°F | Resp 14 | Ht 61.0 in | Wt 182.8 lb

## 2016-10-16 DIAGNOSIS — Z0000 Encounter for general adult medical examination without abnormal findings: Secondary | ICD-10-CM

## 2016-10-16 DIAGNOSIS — E2839 Other primary ovarian failure: Secondary | ICD-10-CM

## 2016-10-16 NOTE — Progress Notes (Signed)
Subjective:   Cathy Coffey is a 75 y.o. female who presents for Medicare Annual (Subsequent) preventive examination.  Review of Systems:  No ROS.  Medicare Wellness Visit.  Cardiac Risk Factors include: advanced age (>88men, >67 women);hypertension;obesity (BMI >30kg/m2)     Objective:     Vitals: BP 128/74 (BP Location: Left Arm, Patient Position: Sitting, Cuff Size: Normal)   Pulse 76   Temp 97.7 F (36.5 C) (Oral)   Resp 14   Ht 5\' 1"  (1.549 m)   Wt 182 lb 12.8 oz (82.9 kg)   SpO2 97%   BMI 34.54 kg/m   Body mass index is 34.54 kg/m.   Tobacco History  Smoking Status  . Never Smoker  Smokeless Tobacco  . Never Used     Counseling given: Not Answered   Past Medical History:  Diagnosis Date  . Asthma   . Diverticulosis   . Hypertension   . Hypothyroidism   . IBS (irritable bowel syndrome)   . Lichen planus   . Pancreatitis due to common bile duct stone 2000   Past Surgical History:  Procedure Laterality Date  . CHOLECYSTECTOMY    . COLONOSCOPY  2011  . HERNIA REPAIR    . MOUTH SURGERY  09/2016  . TUBAL LIGATION     Family History  Problem Relation Age of Onset  . Hypothyroidism Mother   . Hypertension Mother   . Dementia Father   . Dementia Paternal Grandmother    History  Sexual Activity  . Sexual activity: No    Outpatient Encounter Prescriptions as of 10/16/2016  Medication Sig  . Alum & Mag Hydroxide-Simeth (ANTACID ANTI-GAS PO) Take by mouth.  Marland Kitchen amLODipine (NORVASC) 2.5 MG tablet Take 1 tablet (2.5 mg total) by mouth daily.  . Ascorbic Acid (VITAMIN C) 100 MG tablet Take 100 mg by mouth daily.  . B Complex-C (SUPER B COMPLEX PO) Take 1 capsule by mouth.  Marland Kitchen BIOTIN 5000 PO Take by mouth.  . Calcium Carbonate-Vitamin D (CALCIUM-VITAMIN D) 500-200 MG-UNIT per tablet Take 1 tablet by mouth daily.  . Coenzyme Q10 (COQ-10) 30 MG CAPS Take by mouth.  . Cyanocobalamin (B-12) 1000 MCG CAPS Take by mouth.  . cyclobenzaprine (FLEXERIL) 5 MG  tablet Take 1 tablet (5 mg total) by mouth 3 (three) times daily as needed for muscle spasms.  Marland Kitchen desonide (DESOWEN) 0.05 % cream Apply topically 2 (two) times daily.  Marland Kitchen DIGESTIVE AIDS MIXTURE PO Take by mouth.  . estradiol (ESTRACE VAGINAL) 0.1 MG/GM vaginal cream Place 1 Applicatorful vaginally at bedtime.  . furosemide (LASIX) 20 MG tablet Take 1 tablet (20 mg total) by mouth daily.  . Glucosamine HCl 1000 MG TABS Take 1 tablet by mouth.  . levothyroxine (SYNTHROID, LEVOTHROID) 100 MCG tablet Take 1 tablet (100 mcg total) by mouth daily.  . Magnesium 250 MG TABS Take 500 mg by mouth.   . Misc Natural Products (ADVANCED JOINT RELIEF PO) Take by mouth.  . Multiple Vitamins-Minerals (HEALTHY EYES PO) Take by mouth.  . multivitamin-lutein (OCUVITE-LUTEIN) CAPS capsule Take 1 capsule by mouth daily.  Marland Kitchen neomycin-polymyxin-hydrocortisone (CORTISPORIN) otic solution Apply one to two drops to toe after soaking twice daily.  Marland Kitchen NIACIN, ANTIHYPERLIPIDEMIC, PO Take 500 mg by mouth.  . Omega-3 Fatty Acids (FISH OIL) 1200 MG CAPS Take by mouth daily.  . pantoprazole (PROTONIX) 40 MG tablet Take 1 tablet (40 mg total) by mouth 2 (two) times daily.  Marland Kitchen POTASSIUM PO Take 600 mg by  mouth.  . Probiotic Product (TRUBIOTICS PO) Take 1 capsule by mouth.  . traMADol (ULTRAM) 50 MG tablet Take 1 tablet (50 mg total) by mouth every 8 (eight) hours as needed.  . vitamin A 10000 UNIT capsule Take 10,000 Units by mouth daily.   No facility-administered encounter medications on file as of 10/16/2016.     Activities of Daily Living In your present state of health, do you have any difficulty performing the following activities: 10/16/2016  Hearing? N  Vision? N  Difficulty concentrating or making decisions? Y  Walking or climbing stairs? N  Dressing or bathing? N  Doing errands, shopping? N  Preparing Food and eating ? N  Using the Toilet? N  In the past six months, have you accidently leaked urine? N  Do you have  problems with loss of bowel control? Y  Managing your Medications? N  Managing your Finances? N  Housekeeping or managing your Housekeeping? N  Some recent data might be hidden    Patient Care Team: Leone Haven, MD as PCP - General (Family Medicine)    Assessment:    This is a routine wellness examination for [name]. The goal of the wellness visit is to assist the patient how to close the gaps in care and create a preventative care plan for the patient.   Taking calcium VIT D as appropriate/Osteoporosis risk reviewed.  DEXA SCAN ordered; follow as directed.    Medications reviewed; taking without issues or barriers.  Safety issues reviewed; lives with son and grand daughter.  Alarm system with smoke and carbon monoxide detectors in the home. No firearms in the home. Wears seatbelts when driving or riding with others. No violence in the home.  No identified risk were noted; The patient was oriented x 3; appropriate in dress and manner and no objective failures at ADL's or IADL's.   BMI; discussed the importance of a healthy diet, water intake and exercise. Educational material provided.  HTN; controlled with medication.  Stable and followed by PCP.  TDAP and ZOSTAVAX vaccine deferred for follow up with insurance.  Health maintenance gaps; closed.  Patient Concerns: Fatigued more than usual, ongoing IBS and fear of memory loss.  Deferred to PCP for follow up.  Appointment scheduled.  Exercise Activities and Dietary recommendations Current Exercise Habits: Home exercise routine (physical therapy home exercises.  active around the home.), Frequency (Times/Week): 7, Intensity: Mild  Goals    . Healthy Lifestyle          STAY HYDRATED AND DRINK PLENTY OF FLUIDS STAY ACTIVE AROUND THE HOME, CONTINUE PRACTICING PHYSICAL THERAPY RANGE OF MOTION EXERCISES. WALK FOR EXERCISE WHEN ABLE. LOW CARB FOODS. LEAN MEATS, VEGETABLES.      Fall Risk Fall Risk  10/16/2016  06/03/2016 05/13/2016 11/09/2015 11/02/2014  Falls in the past year? No No No No No   Depression Screen PHQ 2/9 Scores 10/16/2016 06/03/2016 05/13/2016 11/09/2015  PHQ - 2 Score 0 0 0 0     Cognitive Function MMSE - Mini Mental State Exam 10/16/2016  Orientation to time 5  Orientation to Place 5  Registration 3  Attention/ Calculation 5  Recall 3  Language- name 2 objects 2  Language- repeat 1  Language- follow 3 step command 3  Language- read & follow direction 1  Write a sentence 1  Copy design 1  Total score 30        Immunization History  Administered Date(s) Administered  . Influenza Split 10/01/2012  . Influenza,  High Dose Seasonal PF 08/21/2016  . Influenza,inj,Quad PF,36+ Mos 07/27/2013, 11/02/2014, 11/09/2015  . Pneumococcal Conjugate-13 11/02/2014  . Pneumococcal Polysaccharide-23 10/01/2012   Screening Tests Health Maintenance  Topic Date Due  . DEXA SCAN  06/30/2008  . ZOSTAVAX  11/10/2017 (Originally 07/01/2003)  . TETANUS/TDAP  11/10/2017 (Originally 06/30/1962)  . MAMMOGRAM  11/16/2017  . COLONOSCOPY  10/01/2020  . INFLUENZA VACCINE  Completed  . PNA vac Low Risk Adult  Completed      Plan:    End of life planning; Advance aging; Advanced directives discussed. No HCPOA/Living Will.  Additional information provided to help her start the conversation with her family.  Copy of completed HCPOA/Living Will short forms requested upon completion.  Time spent on this topic is 18 minutes.  Medicare Attestation I have personally reviewed: The patient's medical and social history Their use of alcohol, tobacco or illicit drugs Their current medications and supplements The patient's functional ability including ADLs,fall risks, home safety risks, cognitive, and hearing and visual impairment Diet and physical activities Evidence for depression   The patient's weight, height, BMI, and visual acuity have been recorded in the chart.  I have made referrals and provided  education to the patient based on review of the above and I have provided the patient with a written personalized care plan for preventive services.    During the course of the visit the patient was educated and counseled about the following appropriate screening and preventive services:   Vaccines to include Pneumoccal, Influenza, Hepatitis B, Td, Zostavax, HCV  Electrocardiogram  Cardiovascular Disease  Colorectal cancer screening  Bone density screening  Diabetes screening  Glaucoma screening  Mammography/PAP  Nutrition counseling   Patient Instructions (the written plan) was given to the patient.   Varney Biles, LPN  [phone]

## 2016-10-16 NOTE — Patient Instructions (Addendum)
  Cathy Coffey , Thank you for taking time to come for your Medicare Wellness Visit. I appreciate your ongoing commitment to your health goals. Please review the following plan we discussed and let me know if I can assist you in the future.   Follow up with Dr. Caryl Bis as needed.  Return for scheduled appointment in January.   These are the goals we discussed: Goals    . Healthy Lifestyle          STAY HYDRATED AND DRINK PLENTY OF FLUIDS STAY ACTIVE AROUND THE HOME, CONTINUE PRACTICING PHYSICAL THERAPY RANGE OF MOTION EXERCISES. WALK FOR EXERCISE WHEN ABLE. LOW CARB FOODS. LEAN MEATS, VEGETABLES.       This is a list of the screening recommended for you and due dates:  Health Maintenance  Topic Date Due  . Tetanus Vaccine  06/30/1962  . Shingles Vaccine  07/01/2003  . DEXA scan (bone density measurement)  06/30/2008  . Mammogram  11/16/2017  . Colon Cancer Screening  10/01/2020  . Flu Shot  Completed  . Pneumonia vaccines  Completed   Bone Densitometry Introduction Bone densitometry is an imaging test that uses a special X-ray to measure the amount of calcium and other minerals in your bones (bone density). This test is also known as a bone mineral density test or dual-energy X-ray absorptiometry (DXA). The test can measure bone density at your hip and your spine. It is similar to having a regular X-ray. You may have this test to:  Diagnose a condition that causes weak or thin bones (osteoporosis).  Predict your risk of a broken bone (fracture).  Determine how well osteoporosis treatment is working. Tell a health care provider about:  Any allergies you have.  All medicines you are taking, including vitamins, herbs, eye drops, creams, and over-the-counter medicines.  Any problems you or family members have had with anesthetic medicines.  Any blood disorders you have.  Any surgeries you have had.  Any medical conditions you have.  Possibility of pregnancy.  Any  other medical test you had within the previous 14 days that used contrast material. What are the risks? Generally, this is a safe procedure. However, problems can occur and may include the following:  This test exposes you to a very small amount of radiation.  The risks of radiation exposure may be greater to unborn children. What happens before the procedure?  Do not take any calcium supplements for 24 hours before having the test. You can otherwise eat and drink what you usually do.  Take off all metal jewelry, eyeglasses, dental appliances, and any other metal objects. What happens during the procedure?  You may lie on an exam table. There will be an X-ray generator below you and an imaging device above you.  Other devices, such as boxes or braces, may be used to position your body properly for the scan.  You will need to lie still while the machine slowly scans your body.  The images will show up on a computer monitor. What happens after the procedure? You may need more testing at a later time. This information is not intended to replace advice given to you by your health care provider. Make sure you discuss any questions you have with your health care provider. Document Released: 11/19/2004 Document Revised: 04/04/2016 Document Reviewed: 04/07/2014  2017 Elsevier

## 2016-10-17 NOTE — Progress Notes (Signed)
I have reviewed the above note and agree. We'll discuss issues at follow-up with patient.  [name], M.D.

## 2016-10-21 ENCOUNTER — Telehealth: Payer: Self-pay | Admitting: Family Medicine

## 2016-10-21 NOTE — Telephone Encounter (Signed)
FYI

## 2016-10-21 NOTE — Telephone Encounter (Signed)
[facility]  Patient Name: LUCAS AURE  DOB: June 29, 1941    Initial Comment Caller states she had been vomiting. She got it from her [place]. Wants to know if she can get it again.   Nurse Assessment  Nurse: Wayne Sever, RN, Tillie Rung Date/Time (Eastern Time): 10/21/2016 10:16:02 AM  Confirm and document reason for call. If symptomatic, describe symptoms. ---Caller states she got a virus from her granddaughter. She states it's throwing up and diarrhea. She states she had [place] several years ago. She wants to know if it's the same thing as [place]. Told her it would be hard to say what exactly virus is causing her symptoms. Told her most viruses that cause vomiting and diarrhea are contagious. She states understanding. She denied triage and states she is better today, but had questions about the stomach virus that I answered. All advice given per general nursing knowledge.  Does the patient have any new or worsening symptoms? ---Yes  Will a triage be completed? ---No  Select reason for no triage. ---Patient declined     Guidelines    Guideline Title Affirmed Question Affirmed Notes       Final Disposition User   Clinical Call [place], RN, Tillie Rung

## 2016-10-23 NOTE — Telephone Encounter (Signed)
Noted  

## 2016-10-30 ENCOUNTER — Other Ambulatory Visit: Payer: Self-pay | Admitting: Family Medicine

## 2016-10-30 DIAGNOSIS — R1013 Epigastric pain: Secondary | ICD-10-CM

## 2016-10-30 NOTE — Telephone Encounter (Signed)
Refill sent to pharmacy. Please see if there is a specific reason the patient takes this medication. Thanks.

## 2016-10-30 NOTE — Telephone Encounter (Signed)
Last ordered 08/27/16 90 day supply

## 2016-10-31 NOTE — Telephone Encounter (Signed)
Patient states she is taking it for reflux

## 2016-10-31 NOTE — Telephone Encounter (Signed)
Noted  

## 2016-11-21 ENCOUNTER — Ambulatory Visit (INDEPENDENT_AMBULATORY_CARE_PROVIDER_SITE_OTHER): Payer: Medicare HMO | Admitting: Family Medicine

## 2016-11-21 ENCOUNTER — Encounter: Payer: Self-pay | Admitting: Family Medicine

## 2016-11-21 VITALS — BP 140/96 | HR 59 | Temp 98.2°F | Wt 184.6 lb

## 2016-11-21 DIAGNOSIS — E559 Vitamin D deficiency, unspecified: Secondary | ICD-10-CM | POA: Diagnosis not present

## 2016-11-21 DIAGNOSIS — R413 Other amnesia: Secondary | ICD-10-CM | POA: Diagnosis not present

## 2016-11-21 DIAGNOSIS — E039 Hypothyroidism, unspecified: Secondary | ICD-10-CM | POA: Diagnosis not present

## 2016-11-21 DIAGNOSIS — I10 Essential (primary) hypertension: Secondary | ICD-10-CM

## 2016-11-21 LAB — COMPREHENSIVE METABOLIC PANEL
ALBUMIN: 4.2 g/dL (ref 3.5–5.2)
ALK PHOS: 92 U/L (ref 39–117)
ALT: 16 U/L (ref 0–35)
AST: 18 U/L (ref 0–37)
BUN: 17 mg/dL (ref 6–23)
CHLORIDE: 105 meq/L (ref 96–112)
CO2: 31 mEq/L (ref 19–32)
Calcium: 9.5 mg/dL (ref 8.4–10.5)
Creatinine, Ser: 0.83 mg/dL (ref 0.40–1.20)
GFR: 71.54 mL/min (ref >60.00)
GLUCOSE: 102 mg/dL — AB (ref 70–99)
POTASSIUM: 4.2 meq/L (ref 3.5–5.1)
SODIUM: 144 meq/L (ref 135–145)
TOTAL PROTEIN: 6.6 g/dL (ref 6.0–8.3)
Total Bilirubin: 0.4 mg/dL (ref 0.2–1.2)

## 2016-11-21 LAB — VITAMIN D 25 HYDROXY (VIT D DEFICIENCY, FRACTURES): VITD: 24.29 ng/mL — ABNORMAL LOW (ref 30.00–100.00)

## 2016-11-21 LAB — CBC
HEMATOCRIT: 39.1 % (ref 36.0–46.0)
HEMOGLOBIN: 13.3 g/dL (ref 12.0–15.0)
MCHC: 33.9 g/dL (ref 30.0–36.0)
MCV: 87 fl (ref 78.0–100.0)
Platelets: 229 10*3/uL (ref 150.0–400.0)
RBC: 4.49 Mil/uL (ref 3.87–5.11)
RDW: 14.1 % (ref 11.5–15.5)
WBC: 6.6 10*3/uL (ref 4.0–10.5)

## 2016-11-21 LAB — TSH: TSH: 0.98 u[IU]/mL (ref 0.35–4.50)

## 2016-11-21 LAB — VITAMIN B12: VITAMIN B 12: 1409 pg/mL — AB (ref 211–911)

## 2016-11-21 NOTE — Progress Notes (Signed)
Pre visit review using our clinic review tool, if applicable. No additional management support is needed unless otherwise documented below in the visit note. 

## 2016-11-21 NOTE — Progress Notes (Signed)
  Cathy Rumps, MD Phone: [phone]  Cathy Coffey is a 76 y.o. female who presents today for f/u  HYPERTENSION  Disease Monitoring  Home BP Monitoring 121-131/72-94 Chest pain- no    Dyspnea- no Medications  Compliance-  Taking amlodipine, lasix.  Edema- no  Hypothyroidism: Taking Synthroid. Notes more dry skin recently than usual. She has tried a number of things to help with this. No cold intolerance. She has been putting some more weight on the usual.  Patient notes memory issues throughout her life though worsened somewhat over the last year. Has difficulty remembering short-term things. Couldn't remember her grandchild's guitar lesson. Notes she will blink out during a conversation. She has no issues with her ADLs or IADLs. Notes her father had dementia though he is diagnosed with this when he was 37.   PMH: Never smoker   ROS see history of present illness  Objective  Physical Exam Vitals:   11/21/16 0835 11/21/16 0915  BP: (!) 160/92 (!) 140/96  Pulse: (!) 59   Temp: 98.2 F (36.8 C)     BP Readings from Last 3 Encounters:  11/21/16 (!) 140/96  10/16/16 128/74  08/21/16 136/78   Wt Readings from Last 3 Encounters:  11/21/16 184 lb 9.6 oz (83.7 kg)  10/16/16 182 lb 12.8 oz (82.9 kg)  08/21/16 183 lb 9.6 oz (83.3 kg)    Physical Exam  Constitutional: No distress.  Cardiovascular: Normal rate, regular rhythm and normal heart sounds.   Pulmonary/Chest: Effort normal and breath sounds normal.  Musculoskeletal: She exhibits no edema.  Neurological: She is alert. Gait normal.  2 out of 3 on 3 word recall and full completion clock on mini cog  Skin: She is not diaphoretic.     Assessment/Plan: Please see individual problem list.  Hypertension Elevated above goal today and has been slightly elevated at home. Patient is hesitant to start on any other medicines or increase any medication doses. We discussed trying diet and exercise and decreasing her  salt intake. Follow-up in one month for recheck.  Hypothyroidism Possible symptoms of hypothyroidism with dry skin and weight gain. She notes this is similar to when she was first diagnosed. Check TSH today.  Memory difficulty Patient with some short-term memory issues. Passed the mini cog. No issues with ADLs or IADLs. We'll check some lab work to rule out other causes. Discussed staying mentally active with reading and puzzles. Discussed staying physically active as well.   Orders Placed This Encounter  Procedures  . CBC  . Comp Met (CMET)  . TSH  . Vitamin D (25 hydroxy)  . B12    Cathy Rumps, MD [place]

## 2016-11-21 NOTE — Assessment & Plan Note (Signed)
Patient with some short-term memory issues. Passed the mini cog. No issues with ADLs or IADLs. We'll check some lab work to rule out other causes. Discussed staying mentally active with reading and puzzles. Discussed staying physically active as well.

## 2016-11-21 NOTE — Assessment & Plan Note (Signed)
Possible symptoms of hypothyroidism with dry skin and weight gain. She notes this is similar to when she was first diagnosed. Check TSH today.

## 2016-11-21 NOTE — Patient Instructions (Signed)
Nice to see you. Please work on diet and exercise and decreasing your salt intake to see if this will help with your blood pressure. Please continue to check your blood pressure at home. If it is greater than 140/90 consistently please let us know. Please try to stay mentally active by reading as much as possible. Please also stay physically active. We will check some lab work today and contact you with the results.

## 2016-11-21 NOTE — Assessment & Plan Note (Signed)
Elevated above goal today and has been slightly elevated at home. Patient is hesitant to start on any other medicines or increase any medication doses. We discussed trying diet and exercise and decreasing her salt intake. Follow-up in one month for recheck.

## 2016-11-24 ENCOUNTER — Other Ambulatory Visit: Payer: Self-pay | Admitting: Family Medicine

## 2016-11-24 DIAGNOSIS — R748 Abnormal levels of other serum enzymes: Secondary | ICD-10-CM

## 2016-11-24 DIAGNOSIS — E559 Vitamin D deficiency, unspecified: Secondary | ICD-10-CM

## 2016-11-24 MED ORDER — VITAMIN D (ERGOCALCIFEROL) 1.25 MG (50000 UNIT) PO CAPS: 50000.0000 [IU] | ORAL_CAPSULE | ORAL | 0 refills | Status: DC

## 2016-12-05 DIAGNOSIS — R69 Illness, unspecified: Secondary | ICD-10-CM | POA: Diagnosis not present

## 2017-01-08 ENCOUNTER — Telehealth: Payer: Self-pay | Admitting: *Deleted

## 2017-01-08 NOTE — Telephone Encounter (Signed)
Pt requested to have her last labs drawn mailed to her.

## 2017-01-08 NOTE — Telephone Encounter (Signed)
You can mail a copy of her labs as part of a letter with results. Thanks.

## 2017-01-08 NOTE — Telephone Encounter (Signed)
Please advise 

## 2017-01-09 NOTE — Telephone Encounter (Signed)
mailed

## 2017-01-10 ENCOUNTER — Encounter: Payer: Self-pay | Admitting: Family Medicine

## 2017-01-20 ENCOUNTER — Other Ambulatory Visit: Payer: Self-pay | Admitting: Family Medicine

## 2017-01-20 NOTE — Telephone Encounter (Signed)
We need to check labs first and then determine if she needs this medication refilled.

## 2017-01-20 NOTE — Telephone Encounter (Signed)
Lab results say patient to have Vit D level and B 12 level rechecked, patient has an appointment for labs on 01/23/17. Patient requesting refill on Vit D.

## 2017-01-23 ENCOUNTER — Other Ambulatory Visit (INDEPENDENT_AMBULATORY_CARE_PROVIDER_SITE_OTHER): Payer: Medicare HMO

## 2017-01-23 DIAGNOSIS — R748 Abnormal levels of other serum enzymes: Secondary | ICD-10-CM

## 2017-01-23 DIAGNOSIS — E559 Vitamin D deficiency, unspecified: Secondary | ICD-10-CM | POA: Diagnosis not present

## 2017-01-23 LAB — VITAMIN B12: Vitamin B-12: 619 pg/mL (ref 211–911)

## 2017-01-23 LAB — VITAMIN D 25 HYDROXY (VIT D DEFICIENCY, FRACTURES): VITD: 29.96 ng/mL — ABNORMAL LOW (ref 30.00–100.00)

## 2017-01-29 ENCOUNTER — Ambulatory Visit (INDEPENDENT_AMBULATORY_CARE_PROVIDER_SITE_OTHER): Payer: Medicare HMO | Admitting: Family Medicine

## 2017-01-29 ENCOUNTER — Ambulatory Visit (INDEPENDENT_AMBULATORY_CARE_PROVIDER_SITE_OTHER): Payer: Medicare HMO

## 2017-01-29 ENCOUNTER — Encounter: Payer: Self-pay | Admitting: Family Medicine

## 2017-01-29 VITALS — BP 126/86 | HR 66 | Temp 98.2°F | Resp 14 | Ht 61.0 in | Wt 184.4 lb

## 2017-01-29 DIAGNOSIS — I10 Essential (primary) hypertension: Secondary | ICD-10-CM

## 2017-01-29 DIAGNOSIS — G4733 Obstructive sleep apnea (adult) (pediatric): Secondary | ICD-10-CM | POA: Diagnosis not present

## 2017-01-29 DIAGNOSIS — K581 Irritable bowel syndrome with constipation: Secondary | ICD-10-CM | POA: Diagnosis not present

## 2017-01-29 DIAGNOSIS — M25562 Pain in left knee: Secondary | ICD-10-CM | POA: Diagnosis not present

## 2017-01-29 DIAGNOSIS — M25569 Pain in unspecified knee: Secondary | ICD-10-CM

## 2017-01-29 DIAGNOSIS — R1013 Epigastric pain: Secondary | ICD-10-CM | POA: Diagnosis not present

## 2017-01-29 DIAGNOSIS — G8929 Other chronic pain: Secondary | ICD-10-CM | POA: Insufficient documentation

## 2017-01-29 DIAGNOSIS — R5383 Other fatigue: Secondary | ICD-10-CM | POA: Diagnosis not present

## 2017-01-29 DIAGNOSIS — M1712 Unilateral primary osteoarthritis, left knee: Secondary | ICD-10-CM | POA: Diagnosis not present

## 2017-01-29 HISTORY — DX: Other fatigue: R53.83

## 2017-01-29 LAB — COMPREHENSIVE METABOLIC PANEL
ALBUMIN: 4.3 g/dL (ref 3.5–5.2)
ALT: 16 U/L (ref 0–35)
AST: 16 U/L (ref 0–37)
Alkaline Phosphatase: 80 U/L (ref 39–117)
BUN: 18 mg/dL (ref 6–23)
CHLORIDE: 105 meq/L (ref 96–112)
CO2: 29 mEq/L (ref 19–32)
Calcium: 9.8 mg/dL (ref 8.4–10.5)
Creatinine, Ser: 0.92 mg/dL (ref 0.40–1.20)
GFR: 63.5 mL/min (ref >60.00)
GLUCOSE: 102 mg/dL — AB (ref 70–99)
POTASSIUM: 4.6 meq/L (ref 3.5–5.1)
SODIUM: 141 meq/L (ref 135–145)
Total Bilirubin: 0.4 mg/dL (ref 0.2–1.2)
Total Protein: 6.6 g/dL (ref 6.0–8.3)

## 2017-01-29 LAB — CBC
HCT: 40.2 % (ref 36.0–46.0)
HEMOGLOBIN: 13.6 g/dL (ref 12.0–15.0)
MCHC: 33.8 g/dL (ref 30.0–36.0)
MCV: 87.5 fl (ref 78.0–100.0)
Platelets: 209 10*3/uL (ref 150.0–400.0)
RBC: 4.6 Mil/uL (ref 3.87–5.11)
RDW: 13.9 % (ref 11.5–15.5)
WBC: 6 10*3/uL (ref 4.0–10.5)

## 2017-01-29 LAB — TSH: TSH: 0.73 u[IU]/mL (ref 0.35–4.50)

## 2017-01-29 LAB — MAGNESIUM: MAGNESIUM: 2.4 mg/dL (ref 1.5–2.5)

## 2017-01-29 MED ORDER — PANTOPRAZOLE SODIUM 40 MG PO TBEC: 40.0000 mg | DELAYED_RELEASE_TABLET | Freq: Two times a day (BID) | ORAL | 2 refills | Status: DC

## 2017-01-29 MED ORDER — FUROSEMIDE 20 MG PO TABS: 20.0000 mg | ORAL_TABLET | Freq: Every day | ORAL | 3 refills | Status: DC

## 2017-01-29 MED ORDER — AMLODIPINE BESYLATE 2.5 MG PO TABS: 2.5000 mg | ORAL_TABLET | Freq: Every day | ORAL | 3 refills | Status: DC

## 2017-01-29 MED ORDER — LEVOTHYROXINE SODIUM 100 MCG PO TABS: 100.0000 ug | ORAL_TABLET | Freq: Every day | ORAL | 3 refills | Status: DC

## 2017-01-29 NOTE — Assessment & Plan Note (Signed)
Suspect this is related to her sleep apnea though we'll obtain lab work to evaluate for other causes.

## 2017-01-29 NOTE — Progress Notes (Signed)
[name], MD Phone: [phone]  Cathy Coffey is a 76 y.o. female who presents today for f/u.  Left knee pain: Patient notes over the last 4-6 weeks she has had some left knee pain. Started after walking on the treadmill at the gym. She notes no specific injury. Was swollen and tight previously and was hard to get moving in the morning. Notes it has improved to some degree with significantly improved swelling. Occasionally gives way. Has not locked on her. Has been taking ibuprofen 400 mg 4 times a day.  Patient notes she is back to using her CPAP. She notes she has been excessively sleepy during the day and does not wake up well rested. Just doesn't have the energy to do a whole lot during the day due to her tiredness. She notes this feels similar to when she was diagnosed with sleep apnea. She has not been using her CPAP in some time. She can easily just fall asleep when sitting there.  Patient notes she is taking magnesium to help her have bowel movements. She has bowel movements daily and she wants to know she is taking too much magnesium. She takes it twice a day. Has bowel movements daily with this regimen. If she does not take this she gets constipated.  PMH: nonsmoker.   ROS see history of present illness  Objective  Physical Exam Vitals:   01/29/17 0820  BP: 126/86  Pulse: 66  Resp: 14  Temp: 98.2 F (36.8 C)    BP Readings from Last 3 Encounters:  01/29/17 126/86  11/21/16 (!) 140/96  10/16/16 128/74   Wt Readings from Last 3 Encounters:  01/29/17 184 lb 6 oz (83.6 kg)  11/21/16 184 lb 9.6 oz (83.7 kg)  10/16/16 182 lb 12.8 oz (82.9 kg)    Physical Exam  Constitutional: No distress.  Cardiovascular: Normal rate, regular rhythm and normal heart sounds.   Pulmonary/Chest: Effort normal and breath sounds normal.  Musculoskeletal: She exhibits no edema.  Left knee with mild medial joint line swelling with mild tenderness, minimal right joint line  swelling, no tenderness of the right joint line, no bony tenderness, possible laxity of the left anterior cruciate ligament on lachman testing, there is a definitive endpoint though, negative McMurray's, right knee with no swelling, tenderness, or ligamentous laxity, negative McMurray's  Neurological: She is alert. Gait normal.  Skin: Skin is warm and dry. She is not diaphoretic.     Assessment/Plan: Please see individual problem list.  OSA (obstructive sleep apnea) Patient with history of obstructive sleep apnea. Symptoms of tiredness and hypersomnia consistent with this. We will get her set up for CPAP titration given that she's not used her CPAP in some time to make sure the settings are correct.  Tiredness Suspect this is related to her sleep apnea though we'll obtain lab work to evaluate for other causes.   Acute pain of left knee Suspect likely degenerative changes leading to symptoms though she does have some laxity of the anterior cruciate ligament on the left side. Will obtain an x-ray of her knee. We will refer her to sports medicine for further evaluation to determine if she needs an MRI. She'll hold off on exercising. She can continue as needed ibuprofen.  IBS (irritable bowel syndrome) Constipation-type IBS. Taking magnesium with good benefit. We'll check a magnesium level to make sure she's not taking too much.   Orders Placed This Encounter  Procedures  . DG Knee Complete 4 Views Left  Standing Status:   Future    Number of Occurrences:   1    Standing Expiration Date:   03/31/2018    Order Specific Question:   Reason for Exam (SYMPTOM  OR DIAGNOSIS REQUIRED)    Answer:   left knee pain and swelling for 4 weeks    Order Specific Question:   Preferred imaging location?    Answer:   [facility]  . Comp Met (CMET)  . TSH  . CBC  . Magnesium  . Ambulatory referral to Sports Medicine    Referral Priority:   Routine    Referral Type:   Consultation     Number of Visits Requested:   1  . Cpap titration    Standing Status:   Future    Standing Expiration Date:   01/29/2018    Order Specific Question:   Where should this test be performed:    Answer:   [place]    [name], MD [place]

## 2017-01-29 NOTE — Assessment & Plan Note (Signed)
Constipation-type IBS. Taking magnesium with good benefit. We'll check a magnesium level to make sure she's not taking too much.

## 2017-01-29 NOTE — Progress Notes (Signed)
Pre-visit discussion using our clinic review tool. No additional management support is needed unless otherwise documented below in the visit note.  

## 2017-01-29 NOTE — Patient Instructions (Signed)
Nice to see you. We'll get an x-ray of your left knee and do some lab work. We will refer you to sports medicine. We will get you set up for CPAP titration as well.

## 2017-01-29 NOTE — Assessment & Plan Note (Signed)
Suspect likely degenerative changes leading to symptoms though she does have some laxity of the anterior cruciate ligament on the left side. Will obtain an x-ray of her knee. We will refer her to sports medicine for further evaluation to determine if she needs an MRI. She'll hold off on exercising. She can continue as needed ibuprofen.

## 2017-01-29 NOTE — Assessment & Plan Note (Signed)
Patient with history of obstructive sleep apnea. Symptoms of tiredness and hypersomnia consistent with this. We will get her set up for CPAP titration given that she's not used her CPAP in some time to make sure the settings are correct.

## 2017-01-29 NOTE — Addendum Note (Signed)
Addended by: Nanci Pina on: 01/29/2017 09:41 AM   Modules accepted: Orders

## 2017-02-05 ENCOUNTER — Ambulatory Visit (INDEPENDENT_AMBULATORY_CARE_PROVIDER_SITE_OTHER): Payer: Medicare HMO | Admitting: Family Medicine

## 2017-02-05 ENCOUNTER — Encounter: Payer: Self-pay | Admitting: Family Medicine

## 2017-02-05 DIAGNOSIS — M25562 Pain in left knee: Secondary | ICD-10-CM

## 2017-02-05 MED ORDER — MELOXICAM 15 MG PO TABS: 15.0000 mg | ORAL_TABLET | Freq: Every day | ORAL | 1 refills | Status: DC

## 2017-02-05 NOTE — Progress Notes (Signed)
  Cathy Coffey - 76 y.o. female MRN [id number]  Date of birth: 03/04/1941  SUBJECTIVE:  Including CC & ROS.   Cathy Coffey is a 76 year old female that is presenting with bilateral knee pain. This pain has been occurring for the past 2 months. The left knee is worse than the right knee. She started trying to work out on a treadmill and had pain while walking. She also had pain with knee extension exercises. The pain is occurring behind her knee and on the medial aspects. She denies any inciting event and started in a gradual fashion. She has been taking ibuprofen with some relief. She also has tried icing her knees. The pain seems to be related to any activity. She has had some swelling and locking. She denies any prior injury or surgery on her knee. She also feels some tightness in her calves.  ROS: No unexpected weight loss, fever, chills, instability, muscle pain, numbness/tingling, redness, otherwise see HPI    HISTORY: Past Medical, Surgical, Social, and Family History Reviewed & Updated per EMR.   Pertinent Historical Findings include: PMSHx -  obstructive sleep apnea, IBS, hypertension, hypothyroidism, spinal stenosis PSHx -  no tobacco use FHx -  hypertension, dementia, hypothyroidism  DATA REVIEWED: 01/29/17: left knee x-ray: mild OA   PHYSICAL EXAM:  VS: BP:(!) 151/71  HR: bpm  TEMP: ( )  RESP:   HT:5\' 1"  (154.9 cm)   WT:180 lb (81.6 kg)  BMI:34.1 PHYSICAL EXAM: Gen: NAD, alert, cooperative with exam, well-appearing HEENT: clear conjunctiva, EOMI CV:  no edema, capillary refill brisk,  Resp: non-labored, normal speech Skin: no rashes, normal turgor  Neuro: no gross deficits.  Psych:  alert and oriented Knee: Some genu valgus observed Normal to inspection with no erythema or effusion or obvious bony abnormalities. Palpation normal with no warmth, joint line tenderness, patellar tenderness, or condyle tenderness. ROM full in flexion and extension and lower leg  rotation. Ligaments with solid consistent endpoints including LCL, MCL. Negative Mcmurray's Non painful patellar compression. Patellar glide without crepitus. Patellar and quadriceps tendons unremarkable. Hamstring and quadriceps strength is normal.  Right Foot:  She has loss of the transverse arch on her right foot. She has hammertoes. She also has splaying with the first 3 digits on her foot. She has not some loss of the longitudinal arch. Neurovascular intact.  ASSESSMENT & PLAN:   Acute pain of left knee Most likely pain is related to underlying OA. She also likely has a component of knee pain related to her gait and abnormalities with her feet. - We'll start mobic - Gave home exercise plan - Advised to follow-up in 2 weeks and can make a per custom orthotics - If that follow-up is the having any improvement can consider formal physical therapy versus injection therapy.

## 2017-02-05 NOTE — Assessment & Plan Note (Signed)
Most likely pain is related to underlying OA. She also likely has a component of knee pain related to her gait and abnormalities with her feet. - We'll start mobic - Gave home exercise plan - Advised to follow-up in 2 weeks and can make a per custom orthotics - If that follow-up is the having any improvement can consider formal physical therapy versus injection therapy.

## 2017-02-19 ENCOUNTER — Encounter: Payer: Self-pay | Admitting: Family Medicine

## 2017-02-19 ENCOUNTER — Ambulatory Visit (INDEPENDENT_AMBULATORY_CARE_PROVIDER_SITE_OTHER): Payer: Medicare HMO | Admitting: Family Medicine

## 2017-02-19 DIAGNOSIS — M25562 Pain in left knee: Secondary | ICD-10-CM

## 2017-02-20 NOTE — Assessment & Plan Note (Signed)
Has improved if she is taking mobic. She has some genus valgus and loss of transverse arch in her feet.  - fitted with green sport insoles size 9-10 and had MT pads placed.  - could consider custom orthotics in the future.  - f/u in 4 weeks or PRN

## 2017-02-20 NOTE — Progress Notes (Signed)
  Cathy Coffey - 76 y.o. female MRN [id number]  Date of birth: [birth year]  SUBJECTIVE:  Including CC & ROS.   Cathy Coffey is a 76 yo F that is following up for her left knee pain. She reports having improvement of the pain if she taking mobic. She has been trying to get to the gym again. Denies any locking or giving way.   ROS: No unexpected weight loss, fever, chills, swelling, instability, muscle pain, numbness/tingling, redness, otherwise see HPI    HISTORY: Past Medical, Surgical, Social, and Family History Reviewed & Updated per EMR.   Pertinent Historical Findings include: PMSHx -  OSA, spinal stenosis  PSHx -  No tobacco use   DATA REVIEWED: 01/29/17: left knee x-ray: mild joint space narrowing   PHYSICAL EXAM:  VS: BP:140/71  HR:80bpm  TEMP: ( )  RESP:   HT:5\' 1"  (154.9 cm)   WT:180 lb (81.6 kg)  BMI:34.1 PHYSICAL EXAM: Gen: NAD, alert, cooperative with exam, well-appearing HEENT: clear conjunctiva, EOMI CV:  no edema, capillary refill brisk,  Resp: non-labored, normal speech Skin: no rashes, normal turgor  Neuro: no gross deficits.  Psych:  alert and oriented Left Knee: Normal to inspection with no erythema or effusion  Some genu valgus  Palpation normal with no warmth, joint line tenderness, patellar tenderness, or condyle tenderness. ROM full in flexion and extension and lower leg rotation. Patellar and quadriceps tendons unremarkable. Hamstring and quadriceps strength is normal.  Neurovascularly intact   ASSESSMENT & PLAN:   Acute pain of left knee Has improved if she is taking mobic. She has some genus valgus and loss of transverse arch in her feet.  - fitted with green sport insoles size 9-10 and had MT pads placed.  - could consider custom orthotics in the future.  - f/u in 4 weeks or PRN

## 2017-02-24 ENCOUNTER — Ambulatory Visit
Admission: RE | Admit: 2017-02-24 | Discharge: 2017-02-24 | Disposition: A | Discharge status: Home / Self Care | Payer: Medicare HMO | Source: Ambulatory Visit | Attending: Family Medicine | Admitting: Family Medicine

## 2017-02-24 DIAGNOSIS — M8588 Other specified disorders of bone density and structure, other site: Secondary | ICD-10-CM | POA: Diagnosis not present

## 2017-02-24 DIAGNOSIS — M810 Age-related osteoporosis without current pathological fracture: Secondary | ICD-10-CM | POA: Diagnosis not present

## 2017-02-24 DIAGNOSIS — E2839 Other primary ovarian failure: Secondary | ICD-10-CM | POA: Diagnosis not present

## 2017-02-25 ENCOUNTER — Telehealth: Payer: Self-pay | Admitting: Family Medicine

## 2017-02-25 NOTE — Telephone Encounter (Signed)
Please advise 

## 2017-02-25 NOTE — Telephone Encounter (Signed)
CPAP titration previously ordered. Will forward to Melissa to see about getting this set up.

## 2017-02-25 NOTE — Telephone Encounter (Signed)
Pt called and wanted to know what the status was of getting her cpap machine checked. Please advise, thank you!  Call pt @ [phone]

## 2017-02-26 NOTE — Telephone Encounter (Signed)
noted 

## 2017-02-27 ENCOUNTER — Other Ambulatory Visit: Payer: Self-pay | Admitting: Family Medicine

## 2017-02-27 MED ORDER — ALENDRONATE SODIUM 70 MG PO TABS: 70.0000 mg | ORAL_TABLET | ORAL | 11 refills | Status: DC

## 2017-03-04 ENCOUNTER — Other Ambulatory Visit: Payer: Self-pay | Admitting: *Deleted

## 2017-03-04 MED ORDER — MELOXICAM 15 MG PO TABS: 15.0000 mg | ORAL_TABLET | Freq: Every day | ORAL | 0 refills | Status: DC

## 2017-03-05 ENCOUNTER — Other Ambulatory Visit: Payer: Self-pay | Admitting: *Deleted

## 2017-03-05 MED ORDER — MELOXICAM 15 MG PO TABS: 15.0000 mg | ORAL_TABLET | Freq: Every day | ORAL | 0 refills | Status: DC

## 2017-03-11 ENCOUNTER — Ambulatory Visit (INDEPENDENT_AMBULATORY_CARE_PROVIDER_SITE_OTHER): Payer: Medicare HMO | Admitting: Family Medicine

## 2017-03-11 ENCOUNTER — Encounter: Payer: Self-pay | Admitting: Family Medicine

## 2017-03-11 DIAGNOSIS — J0101 Acute recurrent maxillary sinusitis: Secondary | ICD-10-CM

## 2017-03-11 DIAGNOSIS — J329 Chronic sinusitis, unspecified: Secondary | ICD-10-CM | POA: Insufficient documentation

## 2017-03-11 MED ORDER — AMOXICILLIN-POT CLAVULANATE 875-125 MG PO TABS: 1.0000 | ORAL_TABLET | Freq: Two times a day (BID) | ORAL | 0 refills | Status: DC

## 2017-03-11 NOTE — Patient Instructions (Signed)
Nice to see you. You likely have a sinus infection. We will treat you with Augmentin. Please take a probiotic or eat some yogurt while on this. You can try Flonase as well. You can continue Claritin. If you do not improve please let us know. If you develop shortness of breath, cough productive of blood, fevers, or any new or changing symptoms please seek medical attention medially.

## 2017-03-11 NOTE — Progress Notes (Signed)
  [name], MD Phone: [phone]  Cathy Coffey is a 76 y.o. female who presents today for same-day visit.  Patient notes about a month's worth of sinus congestion in her left maxillary and frontal sinuses. Notes her left ear started to have some minimal discomfort a couple days ago. She's coughing some green mucus up. She is blowing green/bloody mucus out of her nose. No fevers. No shortness of breath. Does have postnasal drip. Has used a Netty pot with some benefit. Also taking Claritin and a nose spray at times throughout the month. Also using Mucinex and not really now. Initially improved some though has worsened again.  PMH: nonsmoker.   ROS see history of present illness  Objective  Physical Exam Vitals:   03/11/17 1029  BP: 140/88  Pulse: 78  Temp: 98.1 F (36.7 C)    BP Readings from Last 3 Encounters:  03/11/17 140/88  02/19/17 140/71  02/05/17 (!) 151/71   Wt Readings from Last 3 Encounters:  03/11/17 183 lb 9.6 oz (83.3 kg)  02/19/17 180 lb (81.6 kg)  02/05/17 180 lb (81.6 kg)    Physical Exam  Constitutional: No distress.  HENT:  Head: Normocephalic and atraumatic.  Mouth/Throat: Oropharynx is clear and moist. No oropharyngeal exudate.  Left TM minimally erythematous, no purulent fluid, right TM normal  Eyes: Conjunctivae are normal. Pupils are equal, round, and reactive to light.  Neck: Neck supple.  Cardiovascular: Normal rate, regular rhythm and normal heart sounds.   Pulmonary/Chest: Effort normal and breath sounds normal.  Lymphadenopathy:    She has no cervical adenopathy.  Neurological: She is alert. Gait normal.  Skin: She is not diaphoretic.     Assessment/Plan: Please see individual problem list.  Sinusitis Patient with symptoms of sinusitis. Duration argues for bacterial cause. We'll treat with Augmentin. She'll start on Flonase as well. She'll continue Claritin. If not improving she'll let us know. Given return  precautions.   No orders of the defined types were placed in this encounter.   Meds ordered this encounter  Medications  . amoxicillin-clavulanate (AUGMENTIN) 875-125 MG tablet    Sig: Take 1 tablet by mouth 2 (two) times daily.    Dispense:  14 tablet    Refill:  0    [name], MD [place]

## 2017-03-11 NOTE — Assessment & Plan Note (Signed)
Patient with symptoms of sinusitis. Duration argues for bacterial cause. We'll treat with Augmentin. She'll start on Flonase as well. She'll continue Claritin. If not improving she'll let us know. Given return precautions.

## 2017-03-11 NOTE — Progress Notes (Signed)
Pre visit review using our clinic review tool, if applicable. No additional management support is needed unless otherwise documented below in the visit note. 

## 2017-03-21 DIAGNOSIS — H5203 Hypermetropia, bilateral: Secondary | ICD-10-CM | POA: Diagnosis not present

## 2017-03-21 DIAGNOSIS — D2312 Other benign neoplasm of skin of left eyelid, including canthus: Secondary | ICD-10-CM | POA: Diagnosis not present

## 2017-03-21 DIAGNOSIS — H52223 Regular astigmatism, bilateral: Secondary | ICD-10-CM | POA: Diagnosis not present

## 2017-03-21 DIAGNOSIS — H524 Presbyopia: Secondary | ICD-10-CM | POA: Diagnosis not present

## 2017-03-21 DIAGNOSIS — H21251 Iridoschisis, right eye: Secondary | ICD-10-CM | POA: Diagnosis not present

## 2017-03-21 DIAGNOSIS — H40053 Ocular hypertension, bilateral: Secondary | ICD-10-CM | POA: Diagnosis not present

## 2017-03-21 DIAGNOSIS — H40013 Open angle with borderline findings, low risk, bilateral: Secondary | ICD-10-CM | POA: Diagnosis not present

## 2017-03-21 DIAGNOSIS — Z961 Presence of intraocular lens: Secondary | ICD-10-CM | POA: Diagnosis not present

## 2017-03-21 DIAGNOSIS — E039 Hypothyroidism, unspecified: Secondary | ICD-10-CM | POA: Diagnosis not present

## 2017-04-04 DIAGNOSIS — H6503 Acute serous otitis media, bilateral: Secondary | ICD-10-CM | POA: Diagnosis not present

## 2017-04-04 DIAGNOSIS — J309 Allergic rhinitis, unspecified: Secondary | ICD-10-CM | POA: Diagnosis not present

## 2017-04-17 ENCOUNTER — Ambulatory Visit (INDEPENDENT_AMBULATORY_CARE_PROVIDER_SITE_OTHER): Payer: Medicare HMO | Admitting: Family Medicine

## 2017-04-17 ENCOUNTER — Encounter: Payer: Self-pay | Admitting: Family Medicine

## 2017-04-17 DIAGNOSIS — H1032 Unspecified acute conjunctivitis, left eye: Secondary | ICD-10-CM | POA: Diagnosis not present

## 2017-04-17 DIAGNOSIS — H109 Unspecified conjunctivitis: Secondary | ICD-10-CM | POA: Insufficient documentation

## 2017-04-17 NOTE — Patient Instructions (Signed)
Nice to see you. Please monitor your eye and if it does not continue to improve please see your eye doctor. If you develop vision changes, eye pain, or any new or changing symptoms please seek medical attention medially.

## 2017-04-17 NOTE — Progress Notes (Addendum)
  [name], MD Phone: [phone]  Cathy Coffey is a 76 y.o. female who presents today for same-day visit.  Patient notes 2 days ago she was plunging the sink when she believes a drop of water popped up into her left eye. She did not rinse it out right away though did rinse shortly after finishing the task. They had been using drain cleaner though she is unsure exactly what got an MRI. The next day she noted a little bit of redness. Notes it did worsen slightly after using some eyedrops. The redness had been a little bit worse yesterday though has improved some today. She has rinsed her eye out on multiple occasions over the last 2 days. She notes no vision changes. No photophobia. No eye pain. No foreign body sensation. No itching. No burning. Just notes that there is a slight redness to the lateral aspect of the conjunctiva in her left eye.  ROS see history of present illness  Objective  Physical Exam Vitals:   04/17/17 0840  BP: 120/86  Pulse: 74  Temp: 98.3 F (36.8 C)    BP Readings from Last 3 Encounters:  04/17/17 120/86  03/11/17 140/88  02/19/17 140/71   Wt Readings from Last 3 Encounters:  04/17/17 185 lb (83.9 kg)  03/11/17 183 lb 9.6 oz (83.3 kg)  02/19/17 180 lb (81.6 kg)    Physical Exam  Constitutional: She is well-developed, well-nourished, and in no distress.  HENT:  Head: Normocephalic and atraumatic.  Mouth/Throat: Oropharynx is clear and moist.  Eyes: Pupils are equal, round, and reactive to light.  Left lateral conjunctiva with mild erythema, does not extend past the midline, no gross corneal lesions, no pupillary defects, EOMI  Cardiovascular: Normal rate, regular rhythm and normal heart sounds.   Pulmonary/Chest: Effort normal and breath sounds normal.     Assessment/Plan: Please see individual problem list.  Conjunctivitis Patient with slight erythema to the left lateral conjunctiva. No red flag symptoms. Potentially related to the  unknown material she got in her eye several days ago. She reports the erythema has been improving over the last day. She notes no pain. Discussed option of irrigating her eye though she deferred. Also discussed option of seeing her ophthalmologist for check though she deferred to continue monitoring and if worsening she will see the ophthalmologist. Given precautions to seek medical attention. She'll continue moisturizers for the eye. Vision checked and intact.   [name], MD [place]

## 2017-04-17 NOTE — Assessment & Plan Note (Signed)
Patient with slight erythema to the left lateral conjunctiva. No red flag symptoms. Potentially related to the unknown material she got in her eye several days ago. She reports the erythema has been improving over the last day. She notes no pain. Discussed option of irrigating her eye though she deferred. Also discussed option of seeing her ophthalmologist for check though she deferred to continue monitoring and if worsening she will see the ophthalmologist. Given precautions to seek medical attention. She'll continue moisturizers for the eye.

## 2017-05-23 ENCOUNTER — Telehealth: Payer: Self-pay | Admitting: Family Medicine

## 2017-05-23 DIAGNOSIS — R110 Nausea: Secondary | ICD-10-CM | POA: Diagnosis not present

## 2017-05-23 NOTE — Telephone Encounter (Signed)
Pt called and stated that she is having some issues with vertigo. It started yesterday but has gotten worse overnight. Pt would like to know if there is anything that she can do? Please advise, thank you!  Call pt @ [phone]

## 2017-05-23 NOTE — Telephone Encounter (Signed)
Patient going to [facility] to be evaluated per patient for dizziness. FYI

## 2017-05-23 NOTE — Telephone Encounter (Signed)
Noted  

## 2017-06-04 DIAGNOSIS — Z6834 Body mass index (BMI) 34.0-34.9, adult: Secondary | ICD-10-CM | POA: Diagnosis not present

## 2017-06-04 DIAGNOSIS — I10 Essential (primary) hypertension: Secondary | ICD-10-CM | POA: Diagnosis not present

## 2017-06-04 DIAGNOSIS — Z961 Presence of intraocular lens: Secondary | ICD-10-CM | POA: Diagnosis not present

## 2017-06-04 DIAGNOSIS — M130 Polyarthritis, unspecified: Secondary | ICD-10-CM | POA: Diagnosis not present

## 2017-06-04 DIAGNOSIS — Z0000 Encounter for general adult medical examination without abnormal findings: Secondary | ICD-10-CM | POA: Diagnosis not present

## 2017-06-04 DIAGNOSIS — M4800 Spinal stenosis, site unspecified: Secondary | ICD-10-CM | POA: Diagnosis not present

## 2017-06-04 DIAGNOSIS — E669 Obesity, unspecified: Secondary | ICD-10-CM | POA: Diagnosis not present

## 2017-06-04 DIAGNOSIS — K219 Gastro-esophageal reflux disease without esophagitis: Secondary | ICD-10-CM | POA: Diagnosis not present

## 2017-06-04 DIAGNOSIS — E039 Hypothyroidism, unspecified: Secondary | ICD-10-CM | POA: Diagnosis not present

## 2017-06-09 DIAGNOSIS — R69 Illness, unspecified: Secondary | ICD-10-CM | POA: Diagnosis not present

## 2017-06-10 DIAGNOSIS — G4730 Sleep apnea, unspecified: Secondary | ICD-10-CM | POA: Diagnosis not present

## 2017-06-10 DIAGNOSIS — G4710 Hypersomnia, unspecified: Secondary | ICD-10-CM | POA: Diagnosis not present

## 2017-06-13 ENCOUNTER — Telehealth: Payer: Self-pay | Admitting: Family Medicine

## 2017-06-13 DIAGNOSIS — G4733 Obstructive sleep apnea (adult) (pediatric): Secondary | ICD-10-CM

## 2017-06-13 NOTE — Telephone Encounter (Signed)
In sign folder.

## 2017-06-13 NOTE — Telephone Encounter (Signed)
Forms noted. I would like a CPAP titration study in a sleep lab. An order has been placed. I will forward this to [facility] and give her the sleep study results. Thanks.

## 2017-06-13 NOTE — Telephone Encounter (Signed)
[employer] dropped off forms to be signed and faxed back. Fax [phone] Placed in colored folder upfront

## 2017-06-23 ENCOUNTER — Other Ambulatory Visit: Payer: Self-pay | Admitting: *Deleted

## 2017-06-23 MED ORDER — MELOXICAM 15 MG PO TABS: 15.0000 mg | ORAL_TABLET | Freq: Every day | ORAL | 0 refills | Status: DC

## 2017-06-25 DIAGNOSIS — G4733 Obstructive sleep apnea (adult) (pediatric): Secondary | ICD-10-CM | POA: Diagnosis not present

## 2017-06-27 ENCOUNTER — Encounter: Payer: Self-pay | Admitting: Family Medicine

## 2017-06-27 ENCOUNTER — Ambulatory Visit (INDEPENDENT_AMBULATORY_CARE_PROVIDER_SITE_OTHER): Payer: Medicare HMO | Admitting: Family Medicine

## 2017-06-27 VITALS — BP 138/90 | HR 79 | Temp 97.8°F | Wt 186.2 lb

## 2017-06-27 DIAGNOSIS — R059 Cough, unspecified: Secondary | ICD-10-CM

## 2017-06-27 DIAGNOSIS — J0101 Acute recurrent maxillary sinusitis: Secondary | ICD-10-CM | POA: Diagnosis not present

## 2017-06-27 DIAGNOSIS — R05 Cough: Secondary | ICD-10-CM

## 2017-06-27 MED ORDER — ALBUTEROL SULFATE HFA 108 (90 BASE) MCG/ACT IN AERS: 2.0000 | INHALATION_SPRAY | Freq: Four times a day (QID) | RESPIRATORY_TRACT | 0 refills | Status: DC | PRN

## 2017-06-27 MED ORDER — MELOXICAM 15 MG PO TABS: 15.0000 mg | ORAL_TABLET | Freq: Every day | ORAL | 0 refills | Status: DC

## 2017-06-27 MED ORDER — HYDROCODONE-HOMATROPINE 5-1.5 MG/5ML PO SYRP: 5.0000 mL | ORAL_SOLUTION | Freq: Three times a day (TID) | ORAL | 0 refills | Status: DC | PRN

## 2017-06-27 MED ORDER — AMOXICILLIN-POT CLAVULANATE 875-125 MG PO TABS: 1.0000 | ORAL_TABLET | Freq: Two times a day (BID) | ORAL | 0 refills | Status: DC

## 2017-06-27 MED ORDER — ALBUTEROL SULFATE (2.5 MG/3ML) 0.083% IN NEBU
2.5000 mg | INHALATION_SOLUTION | Freq: Once | RESPIRATORY_TRACT | Status: AC
  Administered 2017-06-27: 2.5 mg via RESPIRATORY_TRACT

## 2017-06-27 MED ORDER — ERYTHROMYCIN 5 MG/GM OP OINT: 1.0000 "application " | TOPICAL_OINTMENT | Freq: Two times a day (BID) | OPHTHALMIC | 0 refills | Status: DC

## 2017-06-27 NOTE — Progress Notes (Addendum)
[name], MD Phone: [phone]  Cathy Coffey is a 76 y.o. female who presents today for same-day visit.  Patient notes for the last week or so she's had symptoms. Started with sore throat and nasal and sinus congestion. Had some postnasal drip. Then started blowing green phlegm out of her nose. Her chest became congested and she started to cough up green mucus. MAXIMUM TEMPERATURE of 100.16F. She notes over the last day or so her left eye is becoming gunky and has had some discharge. She notes no eye pain, form body sensation, photophobia. Does note some slight blurry vision. Notes this started after she started to develop a congestion. She has a history of conjunctivitis. She also has a history of sinus infections.  PMH: nonsmoker.   ROS see history of present illness  Objective  Physical Exam Vitals:   06/27/17 0939  BP: 138/90  Pulse: 79  Temp: 97.8 F (36.6 C)  SpO2: 98%    BP Readings from Last 3 Encounters:  06/27/17 138/90  04/17/17 120/86  03/11/17 140/88   Wt Readings from Last 3 Encounters:  06/27/17 186 lb 3.2 oz (84.5 kg)  04/17/17 185 lb (83.9 kg)  03/11/17 183 lb 9.6 oz (83.3 kg)    Physical Exam  Constitutional: No distress.  HENT:  Head: Normocephalic and atraumatic.  Posterior oropharynx mildly erythematous, bilateral TMs slightly erythematous though no purulence noted  Eyes: Pupils are equal, round, and reactive to light.  Slight left conjunctival injection, no right conjunctival erythema, bilateral corneas with no noted abnormalities on gross exam  Cardiovascular: Normal rate, regular rhythm and normal heart sounds.   Pulmonary/Chest: Effort normal. No respiratory distress. She has no wheezes.  Scattered crackles on exam, patient was given an albuterol nebulizer treatment and these resolved, she had quite a bit of benefit related to the nebulizer treatment  Musculoskeletal: She exhibits no edema.  Neurological: She is alert. Gait normal.    Skin: Skin is warm and dry. She is not diaphoretic.     Assessment/Plan: Please see individual problem list.  Sinusitis Suspect symptoms related to sinusitis likely bacterial in nature given duration and purulent nasal discharge. Possibly some component of bronchitis as well. Also appears to have conjunctivitis. Slight vision changes from prior though not significantly worse. No red flag symptoms for her eyes. Suspect related to conjunctivitis. We'll treat with Augmentin for sinusitis. Erythromycin eye ointment for her left eye. If she develops drainage from her right eye she can start using it in her right eye as well. She'll use an albuterol inhaler. Hycodan for cough. Discussed this can make her drowsy and that she should not drive with this. She is given return precautions. Discussed having her see her eye doctor as well if her vision issues did not improve with treatment of her infection.   No orders of the defined types were placed in this encounter.   Meds ordered this encounter  Medications  . erythromycin ophthalmic ointment    Sig: Place 1 application into the left eye 2 (two) times daily.    Dispense:  3.5 g    Refill:  0  . amoxicillin-clavulanate (AUGMENTIN) 875-125 MG tablet    Sig: Take 1 tablet by mouth 2 (two) times daily.    Dispense:  14 tablet    Refill:  0  . DISCONTD: albuterol (PROVENTIL HFA;VENTOLIN HFA) 108 (90 Base) MCG/ACT inhaler    Sig: Inhale 2 puffs into the lungs every 6 (six) hours as needed for wheezing  or shortness of breath.    Dispense:  1 Inhaler    Refill:  0  . HYDROcodone-homatropine (HYCODAN) 5-1.5 MG/5ML syrup    Sig: Take 5 mLs by mouth every 8 (eight) hours as needed for cough.    Dispense:  120 mL    Refill:  0  . meloxicam (MOBIC) 15 MG tablet    Sig: Take 1 tablet (15 mg total) by mouth daily.    Dispense:  90 tablet    Refill:  0  . albuterol (PROVENTIL) (2.5 MG/3ML) 0.083% nebulizer solution 2.5 mg  . albuterol (VENTOLIN HFA) 108  (90 Base) MCG/ACT inhaler    Sig: Inhale 2 puffs into the lungs every 6 (six) hours as needed for wheezing or shortness of breath.    Dispense:  1 Inhaler    Refill:  0   [name], MD [place]

## 2017-06-27 NOTE — Patient Instructions (Signed)
Nice to see you. You likely have a sinus infection and possible bronchitis. We'll treat you with Augmentin. He should continue the erythromycin eye ointment in your left eye. You should use the albuterol inhaler every 6 hours for the next 2 days. Then you can use it every 6 hours as needed. I would recommend seeing your eye doctor as well. If you develop any eye pain or vision changes or trouble breathing or cough productive of blood or fevers please seek medical attention.

## 2017-06-27 NOTE — Assessment & Plan Note (Signed)
Suspect symptoms related to sinusitis likely bacterial in nature given duration and purulent nasal discharge. Possibly some component of bronchitis as well. Also appears to have conjunctivitis. Slight vision changes from prior though not significantly worse. No red flag symptoms for her eyes. Suspect related to conjunctivitis. We'll treat with Augmentin for sinusitis. Erythromycin eye ointment for her left eye. If she develops drainage from her right eye she can start using it in her right eye as well. She'll use an albuterol inhaler. Hycodan for cough. Discussed this can make her drowsy and that she should not drive with this. She is given return precautions. Discussed having her see her eye doctor as well if her vision issues did not improve with treatment of her infection.

## 2017-06-27 NOTE — Addendum Note (Signed)
Addended by: Leone Haven on: 06/27/2017 06:10 PM   Modules accepted: Orders

## 2017-06-30 ENCOUNTER — Telehealth: Payer: Self-pay | Admitting: Family Medicine

## 2017-06-30 MED ORDER — DOXYCYCLINE HYCLATE 100 MG PO TABS: 100.0000 mg | ORAL_TABLET | Freq: Two times a day (BID) | ORAL | 0 refills | Status: DC

## 2017-06-30 NOTE — Telephone Encounter (Signed)
Pt called about still not feeling well. Pt is still running a low grade fever. Pt was told to call if she was not feeling better. Please advise?  Call pt @ [phone]. Thank you!

## 2017-06-30 NOTE — Telephone Encounter (Signed)
Noted. The itching could be related to the antibiotic. She should discontinue the Augmentin. We will replace this with doxycycline. I will send this to her pharmacy though given that she has not improved any I would suggest reevaluation as well. This could be in our office, the walk-in clinic, or in urgent care.

## 2017-06-30 NOTE — Telephone Encounter (Signed)
Please get more details. Please see if she is improved to any degree or if she has worsened. Please see will kind of temperature she has had. Please see has any shortness of breath, cough productive of blood, chest pain, or any new symptoms from last week. I will then be able to determine the next step.

## 2017-06-30 NOTE — Telephone Encounter (Signed)
Patient states it has gotten worse and she is itching, she has had a temp of 99.7-100.3. Patient did take benadryl for itching. She believes it is from the oral antibiotics   [facility] [address]

## 2017-06-30 NOTE — Telephone Encounter (Signed)
Please advise 

## 2017-06-30 NOTE — Telephone Encounter (Signed)
Patient notified, patient states she will try doxycycline and if there is no improvement will call to schedule appmt. I added Augmentin to allergy list

## 2017-07-04 ENCOUNTER — Telehealth: Payer: Self-pay | Admitting: *Deleted

## 2017-07-04 NOTE — Telephone Encounter (Signed)
Patient states she feels much better

## 2017-07-04 NOTE — Telephone Encounter (Signed)
She should continue to monitor off medication.

## 2017-07-04 NOTE — Telephone Encounter (Signed)
Please contact the patient and see how she is feeling. Please see if she continues to have upper respiratory symptoms. At this point with the 2 medications that she has tried and not tolerated the option would be Levaquin for a sinus infection though if she is having persistent symptoms or worsening symptoms I would suggest being reevaluated prior to prescribing medication.

## 2017-07-04 NOTE — Telephone Encounter (Signed)
Please advise 

## 2017-07-04 NOTE — Telephone Encounter (Signed)
Pt stated that she will discontinue taking doxycycline , this medication is causing upset stomach. Pt requested a call to discuss a change in medication  Pt contact [phone]

## 2017-07-07 NOTE — Telephone Encounter (Signed)
Patient notified

## 2017-07-22 ENCOUNTER — Ambulatory Visit (INDEPENDENT_AMBULATORY_CARE_PROVIDER_SITE_OTHER): Payer: Medicare HMO | Admitting: Sports Medicine

## 2017-07-22 VITALS — BP 122/82 | Ht 61.0 in | Wt 182.0 lb

## 2017-07-22 DIAGNOSIS — M1712 Unilateral primary osteoarthritis, left knee: Secondary | ICD-10-CM | POA: Diagnosis not present

## 2017-07-22 DIAGNOSIS — M25562 Pain in left knee: Secondary | ICD-10-CM

## 2017-07-22 NOTE — Progress Notes (Signed)
   Subjective:    Patient ID: Cathy Coffey, female    DOB: 18-Jul-1941, 76 y.o.   MRN: [id number]  HPI chief complaint: Left knee pain  Very pleasant 76 year old female comes in today complaining of left knee pain. 4 days ago she fell while coming down her stairs at home. She landed directly on her left knee. At first she had great difficulty bearing weight but that has improved. She has been using ice judiciously which has been helpful. She was also having to ambulate with a walker but not any longer. Her pain is diffuse around the left knee. She's beginning to experience some right knee pain due to her limping. She was diagnosed with osteoarthritis a few months ago. She saw Dr. Raeford Razor. He placed her on meloxicam and gave her some home exercises. Her pain resolved as a result of that. She admits that she has gotten away from doing her exercises and she wonders if that is the reason that she fell. She is also asking about the possibility of a brace. She denies any mechanical symptoms. She's not noticed any swelling. She denies any numbness or tingling. No hip pain. She takes her meloxicam daily. It is very helpful. She also has a muscle relaxer which she takes as needed.   Review of Systems As above    Objective:   Physical Exam  Well-developed, well-nourished. No acute distress. Awake alert and oriented 3. Vital signs reviewed. She is sitting comfortably in the exam room  Left knee: Full range of motion. No obvious effusion. No soft tissue swelling. Mild tenderness to palpation diffusely around the anterior knee but a negative McMurray's. Good ligamentous stability. Neurovascularly intact distally.  Right knee: Full range of motion. No effusion. No soft tissue swelling. Good ligamentous stability. Minimal tenderness along the medial joint line but a negative McMurray's. Neurovascularly intact distally.  Patient's walking with only a very mild limp.  X-rays of the left knee done earlier  this year are reviewed. They are standing x-rays. She has a mild amount of osteoarthritis but not marked.       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 I think that she should resume her exercises to rebuild strength in her leg. She demonstrated for me the exercises that she is doing in the gym. However, I do think she would benefit from a single visit with physical therapy for education in a comprehensive home exercise program. She can then continue this in the gym. I discussed purchasing a knee sleeve with activity. She'll continue on her meloxicam and muscle relaxer as needed. I would like for her to follow-up with me in 4-6 weeks reevaluation. If symptoms persist we could consider repeat imaging or possibly a cortisone injection. Patient is encouraged to call me with questions or concerns prior to that visit.

## 2017-07-26 DIAGNOSIS — G4733 Obstructive sleep apnea (adult) (pediatric): Secondary | ICD-10-CM | POA: Diagnosis not present

## 2017-08-01 ENCOUNTER — Ambulatory Visit: Payer: Medicare HMO | Admitting: Family Medicine

## 2017-08-25 DIAGNOSIS — G4733 Obstructive sleep apnea (adult) (pediatric): Secondary | ICD-10-CM | POA: Diagnosis not present

## 2017-08-26 ENCOUNTER — Encounter: Payer: Self-pay | Admitting: Sports Medicine

## 2017-08-26 ENCOUNTER — Ambulatory Visit (INDEPENDENT_AMBULATORY_CARE_PROVIDER_SITE_OTHER): Payer: Medicare HMO | Admitting: Sports Medicine

## 2017-08-26 VITALS — BP 140/90 | Ht 61.0 in | Wt 182.0 lb

## 2017-08-26 DIAGNOSIS — M25561 Pain in right knee: Secondary | ICD-10-CM | POA: Diagnosis not present

## 2017-08-26 DIAGNOSIS — M25562 Pain in left knee: Secondary | ICD-10-CM | POA: Diagnosis not present

## 2017-08-26 MED ORDER — METHYLPREDNISOLONE ACETATE 40 MG/ML IJ SUSP
40.0000 mg | Freq: Once | INTRAMUSCULAR | Status: AC
  Administered 2017-08-26: 40 mg via INTRA_ARTICULAR

## 2017-08-27 NOTE — Progress Notes (Signed)
   Subjective:    Patient ID: Cathy Coffey, female    DOB: August 28, 1941, 76 y.o.   MRN: [id number]  HPI   Patient comes in today for follow-up on left knee pain. Pain persists despite meloxicam and home PT. She was not able to go to formal physical therapy due to cost. She has been doing some exercises at the gym. She complains of posterior knee pain which is worse with activity such as going up and down stairs. She is beginning to get some right knee pain as well. She notices intermittent swelling. X-rays done a few weeks ago showed some mild degenerative changes. She has been able to walk and exercise despite her pain.   Review of Systems As above    Objective:   Physical Exam  Well-developed, well-nourished. No acute distress  Left knee: Full range of motion. No obvious effusion but there is some fullness in the popliteal fossa consistent with a possible Baker's cyst. No soft tissue swelling. Diffuse tenderness to palpation both medial and lateral. Good joint stability. Neurovascularly intact distally. 1+ patellofemoral crepitus.  Right knee: Full range of motion. No effusion. No soft tissue swelling. Good ligamentous stability. Slight tenderness along the medial joint line. Negative McMurray's. Neurovascularly intact distally.      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 Therefore, I recommended bilateral cortisone injections. Patient agrees. Each knee was injected using an anterior lateral approach. She tolerated this without difficulty. If symptoms do not improve in regards to the left knee with today's injection then we will need to consider further diagnostic imaging. She will follow-up with me in 3 weeks if no improvement.  Consent obtained and verified. Time-out conducted. Noted no overlying erythema, induration, or other signs of local  infection. Skin prepped in a sterile fashion. Topical analgesic spray: Ethyl chloride. Joint: right knee Needle: 22g 1.5 inch Completed without difficulty. Meds: 3cc 1% xylocaine, 1cc (40mg ) depomedrol  Advised to call if fevers/chills, erythema, induration, drainage, or persistent bleeding.  Consent obtained and verified. Time-out conducted. Noted no overlying erythema, induration, or other signs of local infection. Skin prepped in a sterile fashion. Topical analgesic spray: Ethyl chloride. Joint: left knee Needle: 22g 1.5 inch Completed without difficulty. Meds: 3cc 1% xylocaine, 1cc (40mg )   Advised to call if fevers/chills, erythema, induration, drainage, or persistent bleeding.

## 2017-09-01 ENCOUNTER — Encounter: Payer: Self-pay | Admitting: Family Medicine

## 2017-09-02 ENCOUNTER — Encounter: Payer: Self-pay | Admitting: Family Medicine

## 2017-09-02 ENCOUNTER — Ambulatory Visit (INDEPENDENT_AMBULATORY_CARE_PROVIDER_SITE_OTHER): Payer: Medicare HMO | Admitting: Family Medicine

## 2017-09-02 DIAGNOSIS — Z23 Encounter for immunization: Secondary | ICD-10-CM

## 2017-09-02 DIAGNOSIS — E039 Hypothyroidism, unspecified: Secondary | ICD-10-CM | POA: Diagnosis not present

## 2017-09-02 DIAGNOSIS — G4733 Obstructive sleep apnea (adult) (pediatric): Secondary | ICD-10-CM

## 2017-09-02 DIAGNOSIS — T148XXA Other injury of unspecified body region, initial encounter: Secondary | ICD-10-CM

## 2017-09-02 DIAGNOSIS — M810 Age-related osteoporosis without current pathological fracture: Secondary | ICD-10-CM | POA: Insufficient documentation

## 2017-09-02 MED ORDER — ALENDRONATE SODIUM 70 MG PO TABS: 70.0000 mg | ORAL_TABLET | ORAL | 11 refills | Status: DC

## 2017-09-02 NOTE — Patient Instructions (Signed)
Nice to see you. Please continue your CPAP. Please monitor the area on your right forearm and if it worsens or develops spreading redness, warmth, or pain or drainage please be evaluated.

## 2017-09-02 NOTE — Assessment & Plan Note (Signed)
Patient with several abrasions with some bruising underlying. No signs of infection. She'll monitor for any signs of infection.

## 2017-09-02 NOTE — Assessment & Plan Note (Signed)
Stable. Continue Synthroid. Check TSH next visit.

## 2017-09-02 NOTE — Progress Notes (Signed)
  [name], MD Phone: [phone]  Cathy Coffey is a 76 y.o. female who presents today for follow-up.  OSA: She underwent re-titration of her CPAP. She notes improvement. Wearing it nightly. Typically getting 6-8 hours of use. She wakes up well rested. No hypersomnia.  Osteoporosis: Taking Fosamax. She's had no issues with taking this. Her T score was -3.8. She's taking calcium and vitamin D supplementation. She notes no GI issues with this.  She notes she got into some thorns yesterday on her right forearm. Notes a few abrasions. Some bruising. No pain. No spreading redness.  Hypothyroidism: Take Synthroid. No skin changes other than some slight itching related to some dust exposure in her basement. She's had this previously and is taking Claritin and Benadryl with benefit. No heat or cold intolerance.  PMH: nonsmoker.   ROS see history of present illness  Objective  Physical Exam Vitals:   09/02/17 0951  BP: 122/80  Pulse: 79  Temp: 98.2 F (36.8 C)  SpO2: 95%    BP Readings from Last 3 Encounters:  09/02/17 122/80  08/26/17 140/90  07/22/17 122/82   Wt Readings from Last 3 Encounters:  09/02/17 184 lb (83.5 kg)  08/26/17 182 lb (82.6 kg)  07/22/17 182 lb (82.6 kg)    Physical Exam  Constitutional: No distress.  Cardiovascular: Normal rate, regular rhythm and normal heart sounds.   Pulmonary/Chest: Effort normal and breath sounds normal.  Musculoskeletal: She exhibits no edema.  Neurological: She is alert. Gait normal.  Skin: Skin is warm and dry. She is not diaphoretic.  4 abrasions on her right dorsal forearm with surrounding bruising, no tenderness, no warmth, no erythema     Assessment/Plan: Please see individual problem list.  OSA (obstructive sleep apnea) Improved. Continue CPAP.  Hypothyroidism Stable. Continue Synthroid. Check TSH next visit.  Osteoporosis Currently on Fosamax. Also taking calcium and vitamin D supplementation. Refill  given on Fosamax. She'll monitor for any GI side effects.  Abrasion Patient with several abrasions with some bruising underlying. No signs of infection. She'll monitor for any signs of infection.   Orders Placed This Encounter  Procedures  . Flu vaccine HIGH DOSE PF    Meds ordered this encounter  Medications  . Multiple Vitamins-Minerals (PRESERVISION AREDS PO)    Sig: Take by mouth.  Marland Kitchen alendronate (FOSAMAX) 70 MG tablet    Sig: Take 1 tablet (70 mg total) by mouth every 7 (seven) days. Take with a full glass of water on an empty stomach.    Dispense:  4 tablet    Refill:  [place], MD [place]

## 2017-09-02 NOTE — Assessment & Plan Note (Signed)
Currently on Fosamax. Also taking calcium and vitamin D supplementation. Refill given on Fosamax. She'll monitor for any GI side effects.

## 2017-09-02 NOTE — Assessment & Plan Note (Signed)
Improved.  Continue CPAP

## 2017-09-25 DIAGNOSIS — G4733 Obstructive sleep apnea (adult) (pediatric): Secondary | ICD-10-CM | POA: Diagnosis not present

## 2017-10-09 ENCOUNTER — Telehealth: Payer: Self-pay | Admitting: Family Medicine

## 2017-10-09 NOTE — Telephone Encounter (Signed)
Copied from [place] [phone]. Topic: General - Other >> Oct 09, 2017  3:12 PM Darl Householder, RMA wrote: Reason for CRM: [employer] is calling on behalf of patient requesting face to face documentation that she is using her c-pap and is benefiting from it in order to continue her insurance billing, paperwork was faxed on 09/02/2017, please contact [name] at [employer] at [phone], she is going to refax request today for office notes from 09/02/2017.

## 2017-10-09 NOTE — Telephone Encounter (Signed)
Please advise 

## 2017-10-10 DIAGNOSIS — M5416 Radiculopathy, lumbar region: Secondary | ICD-10-CM | POA: Diagnosis not present

## 2017-10-10 DIAGNOSIS — M9902 Segmental and somatic dysfunction of thoracic region: Secondary | ICD-10-CM | POA: Diagnosis not present

## 2017-10-10 DIAGNOSIS — M9903 Segmental and somatic dysfunction of lumbar region: Secondary | ICD-10-CM | POA: Diagnosis not present

## 2017-10-10 DIAGNOSIS — M62830 Muscle spasm of back: Secondary | ICD-10-CM | POA: Diagnosis not present

## 2017-10-10 NOTE — Telephone Encounter (Signed)
Have not received this will wait for papers

## 2017-10-13 NOTE — Telephone Encounter (Signed)
Have not received anything will wait for fax

## 2017-10-14 DIAGNOSIS — M62830 Muscle spasm of back: Secondary | ICD-10-CM | POA: Diagnosis not present

## 2017-10-14 DIAGNOSIS — M9902 Segmental and somatic dysfunction of thoracic region: Secondary | ICD-10-CM | POA: Diagnosis not present

## 2017-10-14 DIAGNOSIS — M5416 Radiculopathy, lumbar region: Secondary | ICD-10-CM | POA: Diagnosis not present

## 2017-10-14 DIAGNOSIS — M9903 Segmental and somatic dysfunction of lumbar region: Secondary | ICD-10-CM | POA: Diagnosis not present

## 2017-10-16 ENCOUNTER — Ambulatory Visit (INDEPENDENT_AMBULATORY_CARE_PROVIDER_SITE_OTHER): Payer: Medicare HMO

## 2017-10-16 VITALS — BP 122/78 | HR 80 | Temp 98.4°F | Resp 15 | Ht 61.0 in | Wt 187.8 lb

## 2017-10-16 DIAGNOSIS — Z0000 Encounter for general adult medical examination without abnormal findings: Secondary | ICD-10-CM

## 2017-10-16 DIAGNOSIS — Z1331 Encounter for screening for depression: Secondary | ICD-10-CM | POA: Diagnosis not present

## 2017-10-16 NOTE — Progress Notes (Signed)
Subjective:   MARIFER HURD is a 76 y.o. female who presents for Medicare Annual (Subsequent) preventive examination.  Review of Systems:  No ROS.  Medicare Wellness Visit. Additional risk factors are reflected in the social history.  Cardiac Risk Factors include: advanced age (>79men, >22 women);obesity (BMI >30kg/m2);hypertension     Objective:     Vitals: BP 122/78 (BP Location: Left Arm, Patient Position: Sitting, Cuff Size: Normal)   Pulse 80   Temp 98.4 F (36.9 C) (Oral)   Resp 15   Ht 5\' 1"  (1.549 m)   Wt 187 lb 12.8 oz (85.2 kg)   SpO2 98%   BMI 35.48 kg/m   Body mass index is 35.48 kg/m.  Advanced Directives 10/16/2017 02/19/2017 02/05/2017 10/16/2016  Does Patient Have a Medical Advance Directive? No No No No  Would patient like information on creating a medical advance directive? No - Patient declined - - Yes (MAU/Ambulatory/Procedural Areas - Information given)    Tobacco Social History   Tobacco Use  Smoking Status Never Smoker  Smokeless Tobacco Never Used     Counseling given: Not Answered   Clinical Intake:  Pre-visit preparation completed: Yes  Pain : No/denies pain     Nutritional Status: BMI > 30  Obese Diabetes: No  Activities of Daily Living: Independent Ambulation: Independent Medication Administration: Independent Home Management: Independent     Do you feel unsafe in your current relationship?: No Do you feel physically threatened by others?: No Anyone hurting you at home, work, or school?: No Unable to ask?: No  How often do you need to have someone help you when you read instructions, pamphlets, or other written materials from your doctor or pharmacy?: 1 - Never  Interpreter Needed?: No     Past Medical History:  Diagnosis Date  . Asthma   . Diverticulosis   . Hypertension   . Hypothyroidism   . IBS (irritable bowel syndrome)   . Lichen planus   . Pancreatitis due to common bile duct stone 2000   Past Surgical  History:  Procedure Laterality Date  . CHOLECYSTECTOMY    . COLONOSCOPY  2011  . HERNIA REPAIR    . MOUTH SURGERY  09/2016  . TUBAL LIGATION     Family History  Problem Relation Age of Onset  . Hypothyroidism Mother   . Hypertension Mother   . Dementia Father   . Dementia Paternal Grandmother    Social History   Socioeconomic History  . Marital status: Divorced    Spouse name: None  . Number of children: None  . Years of education: None  . Highest education level: None  Social Needs  . Financial resource strain: None  . Food insecurity - worry: None  . Food insecurity - inability: None  . Transportation needs - medical: None  . Transportation needs - non-medical: None  Occupational History  . None  Tobacco Use  . Smoking status: Never Smoker  . Smokeless tobacco: Never Used  Substance and Sexual Activity  . Alcohol use: No  . Drug use: No  . Sexual activity: No  Other Topics Concern  . None  Social History Narrative  . None    Outpatient Encounter Medications as of 10/16/2017  Medication Sig  . alendronate (FOSAMAX) 70 MG tablet Take 1 tablet (70 mg total) by mouth every 7 (seven) days. Take with a full glass of water on an empty stomach.  . Alum & Mag Hydroxide-Simeth (ANTACID ANTI-GAS PO) Take by mouth.  Marland Kitchen  amLODipine (NORVASC) 2.5 MG tablet Take 1 tablet (2.5 mg total) by mouth daily.  . Ascorbic Acid (VITAMIN C) 100 MG tablet Take 100 mg by mouth daily.  . B Complex-C (SUPER B COMPLEX PO) Take 1 capsule by mouth.  Marland Kitchen BIOTIN 5000 PO Take by mouth.  . Calcium Carbonate-Vitamin D (CALCIUM-VITAMIN D) 500-200 MG-UNIT per tablet Take 3 tablets by mouth daily.   . Coenzyme Q10 (COQ-10) 30 MG CAPS Take by mouth.  . cyclobenzaprine (FLEXERIL) 5 MG tablet Take 1 tablet (5 mg total) by mouth 3 (three) times daily as needed for muscle spasms.  Marland Kitchen desonide (DESOWEN) 0.05 % cream Apply topically 2 (two) times daily.  Marland Kitchen DIGESTIVE AIDS MIXTURE PO Take by mouth.  . furosemide  (LASIX) 20 MG tablet Take 1 tablet (20 mg total) by mouth daily.  . Glucosamine HCl 1000 MG TABS Take 1 tablet by mouth.  . levothyroxine (SYNTHROID, LEVOTHROID) 100 MCG tablet Take 1 tablet (100 mcg total) by mouth daily.  . Magnesium 250 MG TABS Take 500 mg by mouth.   . meloxicam (MOBIC) 15 MG tablet Take 1 tablet (15 mg total) by mouth daily.  . Misc Natural Products (ADVANCED JOINT RELIEF PO) Take by mouth.  . Multiple Vitamins-Minerals (PRESERVISION AREDS PO) Take by mouth.  Marland Kitchen NIACIN, ANTIHYPERLIPIDEMIC, PO Take 500 mg by mouth.  . Omega-3 Fatty Acids (FISH OIL) 1200 MG CAPS Take by mouth daily.  . pantoprazole (PROTONIX) 40 MG tablet Take 1 tablet (40 mg total) by mouth 2 (two) times daily.  Marland Kitchen POTASSIUM PO Take 600 mg by mouth.  . Probiotic Product (TRUBIOTICS PO) Take 1 capsule by mouth.  . traMADol (ULTRAM) 50 MG tablet Take 1 tablet (50 mg total) by mouth every 8 (eight) hours as needed.  . vitamin A 10000 UNIT capsule Take 10,000 Units by mouth daily.   No facility-administered encounter medications on file as of 10/16/2017.     Activities of Daily Living In your present state of health, do you have any difficulty performing the following activities: 10/16/2017 10/16/2016  Hearing? N N  Vision? N N  Difficulty concentrating or making decisions? N [place] not being able to recall information easily   Walking or climbing stairs? Y N  Comment Knee weakness; injections, bilateral -  Dressing or bathing? N N  Doing errands, shopping? N N  Preparing Food and eating ? N N  Using the Toilet? N N  In the past six months, have you accidently leaked urine? N N  Do you have problems with loss of bowel control? N Y  Comment - Hx of IBS  Managing your Medications? N N  Managing your Finances? N N  Housekeeping or managing your Housekeeping? N N  Some recent data might be hidden    Patient Care Team: Leone Haven, MD as PCP - General (Family Medicine)      Assessment:    This is a routine wellness examination for [place]. The goal of the wellness visit is to assist the patient how to close the gaps in care and create a preventative care plan for the patient.   The roster of all physicians providing medical care to patient is listed in the Snapshot section of the chart.  Taking fosamax, calcium VIT D as appropriate/Osteoporosis reviewed.    Safety issues reviewed; Smoke and carbon monoxide detectors in the home. No firearms in the home.  Wears seatbelts when driving or riding with others. Patient does  wear sunscreen or protective clothing when in direct sunlight. No violence in the home.  Depression- PHQ 2 &9 complete.  No signs/symptoms or verbal communication regarding little pleasure in doing things, feeling down, depressed or hopeless. No changes in sleeping, energy, eating, concentrating.  No thoughts of self harm or harm towards others.  Time spent on this topic is 8 minutes.   Patient is alert, normal appearance, oriented to person/place/and time. Correctly identified the president of the [place], recall of 2/3 words, and performing simple calculations. Displays appropriate judgement and can read correct time from watch face.   No new identified risk were noted.  No failures at ADL's or IADL's.    BMI- discussed the importance of a healthy diet, water intake and the benefits of aerobic exercise. Educational material provided.   24 hour diet recall: Breakfast: eggs Lunch: sweet potato, cabbage onion soup Dinner: chicken breast, green vegetable Snack: soup  Daily fluid intake: 0 cups of caffeine, 5-7 cups of water  Dental- every 6 months.  Eye- Visual acuity not assessed per patient preference since they have regular follow up with the ophthalmologist.  Wears corrective lenses.  Sleep patterns- Sleeps 6-8 hours at night.  Wakes feeling rested. CPAP in use.  Health maintenance gaps- closed.  Patient Concerns: Mismanagement of Vit D  medication; she has not been taking any VIT D since last visit 01/2017.  Now taking VIT D 50,000 U per week.  Make a follow up appointment with PCP regarding continued fatigue and VIT D management.    Exercise Activities and Dietary recommendations Current Exercise Habits: Structured exercise class, Type of exercise: strength training/weights;calisthenics, Frequency (Times/Week): 3, Intensity: Mild  Goals    . Increase physical activity     Add more cardio to exercise regimen Increase weight bearing exercises as tolerated Lying, standing exercises at home      Fall Risk Fall Risk  10/16/2017 02/19/2017 10/16/2016 06/03/2016 05/13/2016  Falls in the past year? Yes No No No No  Number falls in past yr: 1 - - - -   Depression Screen PHQ 2/9 Scores 10/16/2017 02/19/2017 10/16/2016 06/03/2016  PHQ - 2 Score 0 0 0 0  PHQ- 9 Score 0 - - -     Cognitive Function MMSE - Mini Mental State Exam 10/16/2016  Orientation to time 5  Orientation to Place 5  Registration 3  Attention/ Calculation 5  Recall 3  Language- name 2 objects 2  Language- repeat 1  Language- follow 3 step command 3  Language- read & follow direction 1  Write a sentence 1  Copy design 1  Total score 30     6CIT Screen 10/16/2017  What Year? 0 points  What month? 0 points  What time? 0 points  Count back from 20 0 points  Months in reverse 0 points  Repeat phrase 0 points  Total Score 0    Immunization History  Administered Date(s) Administered  . Influenza Split 10/01/2012  . Influenza, High Dose Seasonal PF 08/21/2016, 09/02/2017  . Influenza,inj,Quad PF,6+ Mos 07/27/2013, 11/02/2014, 11/09/2015  . Pneumococcal Conjugate-13 11/02/2014  . Pneumococcal Polysaccharide-23 10/01/2012   Screening Tests Health Maintenance  Topic Date Due  . TETANUS/TDAP  11/10/2017 (Originally 06/30/1960)  . MAMMOGRAM  11/16/2017  . COLONOSCOPY  10/01/2020  . INFLUENZA VACCINE  Completed  . DEXA SCAN  Completed  . PNA vac Low  Risk Adult  Completed      Plan:   End of life planning; Advanced aging; Advanced  directives discussed.  No HCPOA/Living Will.  Additional information declined at this time.  I have personally reviewed and noted the following in the patient's chart:   . Medical and social history . Use of alcohol, tobacco or illicit drugs  . Current medications and supplements . Functional ability and status . Nutritional status . Physical activity . Advanced directives . List of other physicians . Hospitalizations, surgeries, and ER visits in previous 12 months . Vitals . Screenings to include cognitive, depression, and falls . Referrals and appointments  In addition, I have reviewed and discussed with patient certain preventive protocols, quality metrics, and best practice recommendations. A written personalized care plan for preventive services as well as general preventive health recommendations were provided to patient.     Varney Biles, LPN  25/06/5276

## 2017-10-16 NOTE — Patient Instructions (Addendum)
  Cathy Coffey , Thank you for taking time to come for your Medicare Wellness Visit. I appreciate your ongoing commitment to your health goals. Please review the following plan we discussed and let me know if I can assist you in the future.   Follow up with Dr. Caryl Bis regarding fatigue and VIT D management.   Have a great day!  These are the goals we discussed: Goals    . Increase physical activity     Add more cardio to exercise regimen Increase weight bearing exercises as tolerated Lying, standing exercises at home       This is a list of the screening recommended for you and due dates:  Health Maintenance  Topic Date Due  . Tetanus Vaccine  11/10/2017*  . Mammogram  11/16/2017  . Colon Cancer Screening  10/01/2020  . Flu Shot  Completed  . DEXA scan (bone density measurement)  Completed  . Pneumonia vaccines  Completed  *Topic was postponed. The date shown is not the original due date.

## 2017-10-17 DIAGNOSIS — G4733 Obstructive sleep apnea (adult) (pediatric): Secondary | ICD-10-CM | POA: Diagnosis not present

## 2017-10-17 DIAGNOSIS — M9902 Segmental and somatic dysfunction of thoracic region: Secondary | ICD-10-CM | POA: Diagnosis not present

## 2017-10-17 DIAGNOSIS — M5416 Radiculopathy, lumbar region: Secondary | ICD-10-CM | POA: Diagnosis not present

## 2017-10-17 DIAGNOSIS — M62830 Muscle spasm of back: Secondary | ICD-10-CM | POA: Diagnosis not present

## 2017-10-17 DIAGNOSIS — M9903 Segmental and somatic dysfunction of lumbar region: Secondary | ICD-10-CM | POA: Diagnosis not present

## 2017-10-22 ENCOUNTER — Other Ambulatory Visit: Payer: Self-pay

## 2017-10-22 DIAGNOSIS — E559 Vitamin D deficiency, unspecified: Secondary | ICD-10-CM

## 2017-10-25 DIAGNOSIS — G4733 Obstructive sleep apnea (adult) (pediatric): Secondary | ICD-10-CM | POA: Diagnosis not present

## 2017-10-29 DIAGNOSIS — M5416 Radiculopathy, lumbar region: Secondary | ICD-10-CM | POA: Diagnosis not present

## 2017-10-29 DIAGNOSIS — M9902 Segmental and somatic dysfunction of thoracic region: Secondary | ICD-10-CM | POA: Diagnosis not present

## 2017-10-29 DIAGNOSIS — M9903 Segmental and somatic dysfunction of lumbar region: Secondary | ICD-10-CM | POA: Diagnosis not present

## 2017-10-29 DIAGNOSIS — M62830 Muscle spasm of back: Secondary | ICD-10-CM | POA: Diagnosis not present

## 2017-11-07 DIAGNOSIS — M5416 Radiculopathy, lumbar region: Secondary | ICD-10-CM | POA: Diagnosis not present

## 2017-11-07 DIAGNOSIS — M9902 Segmental and somatic dysfunction of thoracic region: Secondary | ICD-10-CM | POA: Diagnosis not present

## 2017-11-07 DIAGNOSIS — M62830 Muscle spasm of back: Secondary | ICD-10-CM | POA: Diagnosis not present

## 2017-11-07 DIAGNOSIS — M9903 Segmental and somatic dysfunction of lumbar region: Secondary | ICD-10-CM | POA: Diagnosis not present

## 2017-11-13 ENCOUNTER — Encounter: Payer: Self-pay | Admitting: Family Medicine

## 2017-11-13 ENCOUNTER — Ambulatory Visit (INDEPENDENT_AMBULATORY_CARE_PROVIDER_SITE_OTHER): Payer: Medicare HMO | Admitting: Family Medicine

## 2017-11-13 ENCOUNTER — Other Ambulatory Visit: Payer: Self-pay

## 2017-11-13 VITALS — BP 130/78 | HR 69 | Temp 98.0°F | Wt 187.0 lb

## 2017-11-13 DIAGNOSIS — G8929 Other chronic pain: Secondary | ICD-10-CM | POA: Diagnosis not present

## 2017-11-13 DIAGNOSIS — M25561 Pain in right knee: Secondary | ICD-10-CM

## 2017-11-13 DIAGNOSIS — I10 Essential (primary) hypertension: Secondary | ICD-10-CM

## 2017-11-13 DIAGNOSIS — R1013 Epigastric pain: Secondary | ICD-10-CM | POA: Diagnosis not present

## 2017-11-13 DIAGNOSIS — K219 Gastro-esophageal reflux disease without esophagitis: Secondary | ICD-10-CM | POA: Diagnosis not present

## 2017-11-13 DIAGNOSIS — M25562 Pain in left knee: Secondary | ICD-10-CM

## 2017-11-13 DIAGNOSIS — M545 Low back pain: Secondary | ICD-10-CM

## 2017-11-13 DIAGNOSIS — E039 Hypothyroidism, unspecified: Secondary | ICD-10-CM

## 2017-11-13 DIAGNOSIS — M810 Age-related osteoporosis without current pathological fracture: Secondary | ICD-10-CM

## 2017-11-13 DIAGNOSIS — E559 Vitamin D deficiency, unspecified: Secondary | ICD-10-CM

## 2017-11-13 LAB — TSH: TSH: 0.25 u[IU]/mL — AB (ref 0.35–4.50)

## 2017-11-13 LAB — VITAMIN D 25 HYDROXY (VIT D DEFICIENCY, FRACTURES): VITD: 41.05 ng/mL (ref 30.00–100.00)

## 2017-11-13 MED ORDER — FUROSEMIDE 20 MG PO TABS: 20.0000 mg | ORAL_TABLET | Freq: Every day | ORAL | 3 refills | Status: DC

## 2017-11-13 MED ORDER — AMLODIPINE BESYLATE 2.5 MG PO TABS: 2.5000 mg | ORAL_TABLET | Freq: Every day | ORAL | 3 refills | Status: DC

## 2017-11-13 MED ORDER — ALENDRONATE SODIUM 70 MG PO TABS: 70.0000 mg | ORAL_TABLET | ORAL | 3 refills | Status: DC

## 2017-11-13 MED ORDER — PANTOPRAZOLE SODIUM 40 MG PO TBEC: 40.0000 mg | DELAYED_RELEASE_TABLET | Freq: Every day | ORAL | 2 refills | Status: DC

## 2017-11-13 MED ORDER — TRAMADOL HCL 50 MG PO TABS: 50.0000 mg | ORAL_TABLET | Freq: Three times a day (TID) | ORAL | 0 refills | Status: DC | PRN

## 2017-11-13 NOTE — Progress Notes (Signed)
[name], MD Phone: [phone]  Cathy Coffey is a 77 y.o. female who presents today for follow-up.  GERD: Taking Protonix once daily.  Notes rare symptoms of bloating and then she will increase the Protonix to twice a day and this will go away.  Reports having had an EGD previously about 3 years ago.  At that point she had fairly significant symptoms with lots of pain and burning.  No blood in her stool.  She has been taking vitamin D supplements.  She has increased her calcium supplementation.  Taking Fosamax.  No swallowing issues.  Hypertension: Taking Lasix and amlodipine.  No chest pain or shortness of breath.  Hypothyroidism: She notes she increased her Synthroid on her own and notes this has helped her feel better overall.  She did this about 3 weeks ago.  She has been cutting her pills in half and shaving off a portion.  She reports her low back pain and knee pain is quite a bit better since going to the chiropractor.  She rarely takes the tramadol.  She has been taking Mobic as well as Excedrin.  No numbness, weakness, saddle anesthesia, or bowel or bladder incontinence.  She notes the injections in her knees did help mildly.  Social History   Tobacco Use  Smoking Status Never Smoker  Smokeless Tobacco Never Used     ROS see history of present illness  Objective  Physical Exam Vitals:   11/13/17 0820  BP: 130/78  Pulse: 69  Temp: 98 F (36.7 C)  SpO2: 97%    BP Readings from Last 3 Encounters:  11/13/17 130/78  10/16/17 122/78  09/02/17 122/80   Wt Readings from Last 3 Encounters:  11/13/17 187 lb (84.8 kg)  10/16/17 187 lb 12.8 oz (85.2 kg)  09/02/17 184 lb (83.5 kg)    Physical Exam  Constitutional: No distress.  Cardiovascular: Normal rate, regular rhythm and normal heart sounds.  Pulmonary/Chest: Effort normal and breath sounds normal.  Abdominal: Soft. Bowel sounds are normal. She exhibits no distension. There is no tenderness. There is  no rebound and no guarding.  Musculoskeletal: She exhibits no edema.  No midline spine tenderness, no midline spine step-off, no muscular back tenderness  Neurological: She is alert. Gait normal.  5/5 strength bilateral quads, hamstrings, plantar flexion, and dorsiflexion, sensation light touch intact bilateral lower extremities  Skin: Skin is warm and dry. She is not diaphoretic.     Assessment/Plan: Please see individual problem list.  Hypertension Well-controlled.  Continue current regimen.  GERD (gastroesophageal reflux disease) Well-controlled at this time.  Given osteoporosis I am concerned with her taking Protonix daily.  We will have her try it every other day as she has had symptoms when discontinuing it previously.  If she develops symptoms she can go back on daily treatment.  Hypothyroidism The patient increased her medication on her own.  We will recheck a TSH today.  I did explain that a low normal TSH means that she potentially could be getting borderline too much medication and if she were to increase the medication it would push it too low.  She understood after explanation.  Osteoporosis Check vitamin D today.  Continue Fosamax, calcium, and vitamin D supplementation.  Lumbago Chronic issue.  Better controlled currently.  She has been taking Mobic and Excedrin.  I discussed that she needs to pick 1 of those and she opted to discontinue Mobic.  She will monitor moving forward.  Chronic knee pain Has improved  with going to the chiropractor.  She will continue to monitor with coming off of the Mobic.  Discussed she could trial over-the-counter ibuprofen later in the day if needed.   Cathy Coffey was seen today for follow-up.  Diagnoses and all orders for this visit:  Hypothyroidism, unspecified type -     TSH  Essential hypertension -     amLODipine (NORVASC) 2.5 MG tablet; Take 1 tablet (2.5 mg total) by mouth daily.  Abdominal pain, epigastric -     pantoprazole  (PROTONIX) 40 MG tablet; Take 1 tablet (40 mg total) by mouth daily.  Gastroesophageal reflux disease, esophagitis presence not specified  Vitamin D deficiency -     Vitamin D (25 hydroxy)  Age-related osteoporosis without current pathological fracture  Chronic bilateral low back pain without sciatica  Chronic pain of both knees  Other orders -     alendronate (FOSAMAX) 70 MG tablet; Take 1 tablet (70 mg total) by mouth every 7 (seven) days. Take with a full glass of water on an empty stomach. -     furosemide (LASIX) 20 MG tablet; Take 1 tablet (20 mg total) by mouth daily. -     traMADol (ULTRAM) 50 MG tablet; Take 1 tablet (50 mg total) by mouth every 8 (eight) hours as needed.    Orders Placed This Encounter  Procedures  . TSH    Meds ordered this encounter  Medications  . alendronate (FOSAMAX) 70 MG tablet    Sig: Take 1 tablet (70 mg total) by mouth every 7 (seven) days. Take with a full glass of water on an empty stomach.    Dispense:  12 tablet    Refill:  3  . amLODipine (NORVASC) 2.5 MG tablet    Sig: Take 1 tablet (2.5 mg total) by mouth daily.    Dispense:  90 tablet    Refill:  3  . furosemide (LASIX) 20 MG tablet    Sig: Take 1 tablet (20 mg total) by mouth daily.    Dispense:  90 tablet    Refill:  3  . pantoprazole (PROTONIX) 40 MG tablet    Sig: Take 1 tablet (40 mg total) by mouth daily.    Dispense:  90 tablet    Refill:  2  . traMADol (ULTRAM) 50 MG tablet    Sig: Take 1 tablet (50 mg total) by mouth every 8 (eight) hours as needed.    Dispense:  30 tablet    Refill:  0     [name], MD [place]

## 2017-11-13 NOTE — Assessment & Plan Note (Signed)
Check vitamin D today.  Continue Fosamax, calcium, and vitamin D supplementation.

## 2017-11-13 NOTE — Assessment & Plan Note (Signed)
Well-controlled.  Continue current regimen. 

## 2017-11-13 NOTE — Assessment & Plan Note (Signed)
Chronic issue.  Better controlled currently.  She has been taking Mobic and Excedrin.  I discussed that she needs to pick 1 of those and she opted to discontinue Mobic.  She will monitor moving forward.

## 2017-11-13 NOTE — Assessment & Plan Note (Signed)
Has improved with going to the chiropractor.  She will continue to monitor with coming off of the Mobic.  Discussed she could trial over-the-counter ibuprofen later in the day if needed.

## 2017-11-13 NOTE — Assessment & Plan Note (Signed)
Well-controlled at this time.  Given osteoporosis I am concerned with her taking Protonix daily.  We will have her try it every other day as she has had symptoms when discontinuing it previously.  If she develops symptoms she can go back on daily treatment.

## 2017-11-13 NOTE — Patient Instructions (Signed)
Nice to see you. Please try taking her Protonix every other day and see if this helps with your reflux symptoms. We will check vitamin D and TSH today. We will refill your medications.

## 2017-11-13 NOTE — Assessment & Plan Note (Signed)
The patient increased her medication on her own.  We will recheck a TSH today.  I did explain that a low normal TSH means that she potentially could be getting borderline too much medication and if she were to increase the medication it would push it too low.  She understood after explanation.

## 2017-11-14 ENCOUNTER — Other Ambulatory Visit: Payer: Self-pay | Admitting: *Deleted

## 2017-11-14 MED ORDER — LEVOTHYROXINE SODIUM 100 MCG PO TABS: 100.0000 ug | ORAL_TABLET | Freq: Every day | ORAL | 3 refills | Status: DC

## 2017-11-18 ENCOUNTER — Telehealth: Payer: Self-pay | Admitting: Family Medicine

## 2017-11-18 NOTE — Telephone Encounter (Unsigned)
Copied from [place] [phone]. Topic: General - Other >> Nov 18, 2017  5:28 PM Neva Seat wrote: [facility] [phone] Ref # [id number]  Tramadol 50mg  - Verify diagnosis: cancer or hospice - needs prior authorization - needs to know if it Rx was taken with in 68 w/ Dr. Earney Hamburg

## 2017-11-18 NOTE — Telephone Encounter (Unsigned)
Copied from [place] #[id number]. >> Nov 18, 2017  5:28 PM Neva Seat wrote: [facility] [phone] Ref # [id number]  Tramadol 50mg  - Verify diagnosis: cancer or hospice - needs prior authorization - needs to know if it Rx was taken with in 84 w/ Dr. Earney Hamburg

## 2017-11-18 NOTE — Telephone Encounter (Unsigned)
Copied from [place] [phone]. Topic: General - Other >> Nov 18, 2017  5:28 PM Neva Seat wrote: [facility] [phone] Ref # [id number]  Tramadol 50mg  - Verify diagnosis: cancer or hospice - needs prior authorization - needs to know if it Rx was taken with in 23 w/ Dr. Earney Hamburg

## 2017-11-19 NOTE — Telephone Encounter (Signed)
Prior authorization

## 2017-11-19 NOTE — Telephone Encounter (Signed)
I believe it is for low back pain.

## 2017-11-19 NOTE — Telephone Encounter (Signed)
Please advise on diagnosis

## 2017-11-19 NOTE — Telephone Encounter (Signed)
Please advise 

## 2017-11-20 NOTE — Telephone Encounter (Unsigned)
Copied from [place] [phone]. >> Nov 20, 2017  1:06 PM Neva Seat wrote: Tramadol   [facility] is needing a pre aurthorization to fill - if it's been refilled within last 90 days - or a diagnosis code  [facility] home delivery [id number]  Ref [id number]

## 2017-11-20 NOTE — Telephone Encounter (Unsigned)
Copied from [place] [phone]. Topic: General - Other >> Nov 20, 2017  1:06 PM Neva Seat wrote: Tramadol   [facility] is needing a pre aurthorization to fill - if it's been refilled within last 90 days - or a diagnosis code  [facility] home delivery [phone]  Ref [id number]

## 2017-11-20 NOTE — Telephone Encounter (Signed)
Called [employer] and they informed me that patient will need to fill a 7 day supply first and after that she will be approved for 30 or 90 day supplies. Called patient to inform her of this, patient states she is not using tramadol much and still has some tablets left from an olf rx. Patient states she will call us if she needs more but at this time she does not.

## 2017-11-25 DIAGNOSIS — G4733 Obstructive sleep apnea (adult) (pediatric): Secondary | ICD-10-CM | POA: Diagnosis not present

## 2017-12-11 DIAGNOSIS — R69 Illness, unspecified: Secondary | ICD-10-CM | POA: Diagnosis not present

## 2017-12-18 ENCOUNTER — Ambulatory Visit: Payer: Medicare HMO | Admitting: Sports Medicine

## 2017-12-18 VITALS — BP 152/84 | Ht 61.0 in | Wt 185.0 lb

## 2017-12-18 DIAGNOSIS — M25562 Pain in left knee: Secondary | ICD-10-CM

## 2017-12-19 NOTE — Progress Notes (Signed)
   Subjective:    Patient ID: Cathy Coffey, female    DOB: 1940/12/23, 77 y.o.   MRN: [id number]  HPI   Patient comes in today with persistent left knee pain and instability. Patient suffered an injury to the knee when she fell coming down some stairs at home back in September. X-rays done shortly thereafter showed some mild arthritic changes but nothing acute. After an initial time of watchful waiting I eventually injected her left knee with cortisone on October 16. In fact, I injected both knees at that time. Right knee pain resolved but left knee pain has persisted. Her main complaint is instability in the knee which will cause her to stumble. She is worried about falling. She has been very diligent with her home exercises working on both hip and knee strengthening. She notices swelling intermittently. She's taking ibuprofen 3 times a day which helps somewhat. She gets an occasional catch in the knee. No true locking. No significant pain at rest. She has tried a compression sleeve which was uncomfortable. She also has a double upright brace and is asking whether or not she should be wearing this more often.    Review of Systems As above    Objective:   Physical Exam  Well-developed, well-nourished. No acute distress. Awake alert and oriented 3. Vital signs reviewed  Left knee: Range of motion 0-100. 1+ effusion. She is tender to palpation along medial and lateral joint lines. Positive McMurray's. Good joint stability. Neurovascularly intact distally. Walking with a limp.  MSK ultrasound of the left knee confirms the presence of an effusion. Previous x-ray showed mild arthritic changes      Assessment & Plan:   Persistent left knee pain status post fall-rule out meniscal tear  Given the patient's history of trauma and failure to improve with conservative treatment I recommend that we proceed with an MRI specifically to rule out a meniscal tear. Phone follow-up with those results  when available. We will delineate further treatment based on those findings. In the meantime, I've encouraged her to continue to work on strengthening her hip and knee. She can wear her double upright brace when active if it helps provide her with a sense of stability. She may also continue with her ibuprofen as needed as well (she takes 400 mg up to 3 times daily as needed).

## 2017-12-26 ENCOUNTER — Ambulatory Visit
Admission: RE | Admit: 2017-12-26 | Discharge: 2017-12-26 | Disposition: A | Discharge status: Home / Self Care | Payer: Medicare HMO | Source: Ambulatory Visit | Attending: Sports Medicine | Admitting: Sports Medicine

## 2017-12-26 DIAGNOSIS — M25562 Pain in left knee: Secondary | ICD-10-CM

## 2017-12-26 DIAGNOSIS — G4733 Obstructive sleep apnea (adult) (pediatric): Secondary | ICD-10-CM | POA: Diagnosis not present

## 2017-12-29 ENCOUNTER — Other Ambulatory Visit: Payer: Self-pay

## 2017-12-29 ENCOUNTER — Telehealth: Payer: Self-pay | Admitting: Sports Medicine

## 2017-12-29 DIAGNOSIS — G8929 Other chronic pain: Secondary | ICD-10-CM

## 2017-12-29 DIAGNOSIS — M25562 Pain in left knee: Principal | ICD-10-CM

## 2017-12-29 NOTE — Telephone Encounter (Signed)
  I spoke with the patient on the phone today after reviewing the MRI of her left knee. She has a complex tear of the posterior horn of the lateral meniscus. The mid body is subluxed into the lateral gutter. She also has some full-thickness cartilage loss in the lateral compartment. Since her symptoms all started traumatically and she has failed to improve with conservative treatment, I would like to refer the patient to Dr. Noemi Chapel for possible arthroscopy. I'll defer further workup and treatment to the discretion of Dr. Noemi Chapel and the patient will follow-up with me as needed.

## 2018-01-06 DIAGNOSIS — M25562 Pain in left knee: Secondary | ICD-10-CM | POA: Diagnosis not present

## 2018-01-09 ENCOUNTER — Telehealth: Payer: Self-pay | Admitting: Family Medicine

## 2018-01-09 NOTE — Telephone Encounter (Signed)
Raliegh Ip requesting surgical clearance for left knee scope on 02/25/18, 11/13/17 last  OV Last full labs 01/29/17, no ekg in chart since 2014, these are requested with this clearance if available. Placed in red folder on desk.

## 2018-01-18 NOTE — Telephone Encounter (Signed)
I did not initially get this phone encounter from [place]. Patient needs a clearance visit. Please get this scheduled. Thanks.

## 2018-01-19 NOTE — Telephone Encounter (Signed)
Patient is scheduled   

## 2018-01-23 DIAGNOSIS — G4733 Obstructive sleep apnea (adult) (pediatric): Secondary | ICD-10-CM | POA: Diagnosis not present

## 2018-02-18 ENCOUNTER — Ambulatory Visit (INDEPENDENT_AMBULATORY_CARE_PROVIDER_SITE_OTHER): Payer: Medicare HMO | Admitting: Family Medicine

## 2018-02-18 ENCOUNTER — Encounter: Payer: Self-pay | Admitting: Family Medicine

## 2018-02-18 ENCOUNTER — Other Ambulatory Visit: Payer: Self-pay

## 2018-02-18 ENCOUNTER — Telehealth: Payer: Self-pay | Admitting: Cardiovascular Disease

## 2018-02-18 VITALS — BP 120/72 | HR 82 | Temp 97.8°F | Ht 61.0 in | Wt 190.4 lb

## 2018-02-18 DIAGNOSIS — R0602 Shortness of breath: Secondary | ICD-10-CM

## 2018-02-18 DIAGNOSIS — R0609 Other forms of dyspnea: Secondary | ICD-10-CM | POA: Diagnosis not present

## 2018-02-18 DIAGNOSIS — I447 Left bundle-branch block, unspecified: Secondary | ICD-10-CM | POA: Diagnosis not present

## 2018-02-18 DIAGNOSIS — M25562 Pain in left knee: Secondary | ICD-10-CM | POA: Diagnosis not present

## 2018-02-18 DIAGNOSIS — Z01810 Encounter for preprocedural cardiovascular examination: Secondary | ICD-10-CM

## 2018-02-18 DIAGNOSIS — K581 Irritable bowel syndrome with constipation: Secondary | ICD-10-CM | POA: Diagnosis not present

## 2018-02-18 DIAGNOSIS — G8929 Other chronic pain: Secondary | ICD-10-CM | POA: Diagnosis not present

## 2018-02-18 DIAGNOSIS — Z1239 Encounter for other screening for malignant neoplasm of breast: Secondary | ICD-10-CM

## 2018-02-18 DIAGNOSIS — R9431 Abnormal electrocardiogram [ECG] [EKG]: Secondary | ICD-10-CM

## 2018-02-18 NOTE — Progress Notes (Signed)
[name], MD Phone: [phone]  Cathy Coffey is a 77 y.o. female who presents today for f/u.  Patient presents for surgical clearance for left knee arthroscopy.  She does report some breathlessness when going up several flights of stairs that has progressively worsened.  She does go to the gym and lifts weights and does the bike.  She notes she has had increased fatigue following doing that as well.  She notes no kidney disease.  No chest pain.  No history of heart attack or irregular heartbeat.  No history of stroke.  She does note it takes a long time for her to wake up from her anesthesia though notes no other issues with anesthesia.  No history of seizures.  She does take her Synthroid for her hypothyroidism.  No liver disease, heart failure, asthma, or bronchitis history.  No diabetes.  Does note some issues with constipation.  In the past she had IBS D though has transitioned to the constipation aspect.  She will take 2-3 stool softeners to help have a bowel movement.  And one laxative a week.  Some abdominal discomfort with some distention at times.  She cuts back on certain foods and that helps.  No blood in her stool.  She does take Protonix.   Social History   Tobacco Use  Smoking Status Never Smoker  Smokeless Tobacco Never Used     ROS see history of present illness  Objective  Physical Exam Vitals:   02/18/18 1447  BP: 120/72  Pulse: 82  Temp: 97.8 F (36.6 C)  SpO2: 96%    BP Readings from Last 3 Encounters:  02/18/18 120/72  12/18/17 (!) 152/84  11/13/17 130/78   Wt Readings from Last 3 Encounters:  02/18/18 190 lb 6.4 oz (86.4 kg)  12/18/17 185 lb (83.9 kg)  11/13/17 187 lb (84.8 kg)    Physical Exam  Constitutional: No distress.  HENT:  Head: Normocephalic and atraumatic.  Mouth/Throat: Oropharynx is clear and moist.  Cardiovascular: Normal rate, regular rhythm and normal heart sounds.  Pulmonary/Chest: Effort normal and breath sounds  normal.  Musculoskeletal: She exhibits no edema.  Neurological: She is alert.  Skin: Skin is warm and dry. She is not diaphoretic.   EKG: Normal sinus rhythm, rate 77, left bundle branch block  Assessment/Plan: Please see individual problem list.  IBS (irritable bowel syndrome) Seems to now have IBS constipation type.  Seems to respond to stool softeners and the occasional laxative.  She has a benign abdominal exam.  She will continue to monitor this.  Chronic knee pain Patient is here for preoperative clearance prior to left knee arthroscopy.  She does note some increased dyspnea when going up several flights of stairs as well as fatigue following exercise.  This may be deconditioning related.  EKG was performed revealing a new left bundle branch block.  She has no symptoms currently.  I discussed this with Dr. Rockey Situ over the phone.  He reviewed the EKG and agreed with a left bundle branch block.  I relayed the history to him and he noted that this may be deconditioning though could be cardiac in nature and if I was concerned enough we could proceed with a stress test.  I advised given her progressive symptoms and EKG findings that I thought it would be necessary to rule out a cardiac cause for her symptoms.  He noted this would have to be a Lexiscan stress test given the bundle branch block.  His office  will contact the patient to set her up with this.  Will obtain lab work as outlined below.  She is given return precautions.  Left bundle branch block Noted on EKG.  Undetermined timeframe for this being present.  She will have cardiac evaluation prior to surgery.   Orders Placed This Encounter  Procedures  . MM 3D SCREEN BREAST BILATERAL    Standing Status:   Future    Standing Expiration Date:   04/21/2019    Order Specific Question:   Reason for Exam (SYMPTOM  OR DIAGNOSIS REQUIRED)    Answer:   screening    Order Specific Question:   Preferred imaging location?    Answer:   [facility]  . CBC  . Comp Met (CMET)  . Ambulatory referral to Cardiology    Referral Priority:   Routine    Referral Type:   Consultation    Referral Reason:   Specialty Services Required    Requested Specialty:   Cardiology    Number of Visits Requested:   1  . EKG 12-Lead    No orders of the defined types were placed in this encounter.    [name], MD [place]

## 2018-02-18 NOTE — Assessment & Plan Note (Signed)
Seems to now have IBS constipation type.  Seems to respond to stool softeners and the occasional laxative.  She has a benign abdominal exam.  She will continue to monitor this.

## 2018-02-18 NOTE — Assessment & Plan Note (Signed)
Patient is here for preoperative clearance prior to left knee arthroscopy.  She does note some increased dyspnea when going up several flights of stairs as well as fatigue following exercise.  This may be deconditioning related.  EKG was performed revealing a new left bundle branch block.  She has no symptoms currently.  I discussed this with Dr. Rockey Situ over the phone.  He reviewed the EKG and agreed with a left bundle branch block.  I relayed the history to him and he noted that this may be deconditioning though could be cardiac in nature and if I was concerned enough we could proceed with a stress test.  I advised given her progressive symptoms and EKG findings that I thought it would be necessary to rule out a cardiac cause for her symptoms.  He noted this would have to be a Lexiscan stress test given the bundle branch block.  His office will contact the patient to set her up with this.  Will obtain lab work as outlined below.  She is given return precautions.

## 2018-02-18 NOTE — Telephone Encounter (Signed)
Per Dr. Rockey Situ- call received from Dr. Caryl Bis.  The patient needs to have a lexiscan myoview- dx: SOB/ abnormal EKG/ pre-op clearance- tomorrow or Friday.    [place]  Your caregiver has ordered a Stress Test with nuclear imaging. The purpose of this test is to evaluate the blood supply to your heart muscle. This procedure is referred to as a "Non-Invasive Stress Test." This is because other than having an IV started in your vein, nothing is inserted or "invades" your body. Cardiac stress tests are done to find areas of poor blood flow to the heart by determining the extent of coronary artery disease (CAD). Some patients exercise on a treadmill, which naturally increases the blood flow to your heart, while others who are  unable to walk on a treadmill due to physical limitations have a pharmacologic/chemical stress agent called Lexiscan . This medicine will mimic walking on a treadmill by temporarily increasing your coronary blood flow.   Please note: these test may take anywhere between 2-4 hours to complete  PLEASE REPORT TO [facility] AT THE FIRST DESK WILL DIRECT YOU WHERE TO GO  Date of Procedure: Friday 02/20/18   Arrival Time for Procedure: 7:45 am  Instructions regarding medication:   _x__: Dennis Bast may take all of your regular medications with enough water to get them down safely except,   _x___:  Hold other medications as follows: furosemide the morning of your test   PLEASE NOTIFY THE OFFICE AT LEAST 24 HOURS IN ADVANCE IF YOU ARE UNABLE TO [place].  [phone] AND  PLEASE NOTIFY NUCLEAR MEDICINE AT [facility] AT LEAST 24 HOURS IN ADVANCE IF YOU ARE UNABLE TO KEEP YOUR APPOINTMENT. [phone]  How to prepare for your Myoview test:  1. Do not eat or drink after midnight 2. No caffeine for 24 hours prior to test 3. No smoking 24 hours prior to test. 4. Your medication may be taken with water.  If your doctor stopped a  medication because of this test, do not take that medication. 5. Ladies, please do not wear dresses.  Skirts or pants are appropriate. Please wear a short sleeve shirt. 6. No perfume, cologne or lotion. 7. Wear comfortable walking shoes. No heels!   I called and spoke with the patient and notified her of the above date/ time/ instructions for her procedure.  She is agreeable and verbalizes understanding. I gave her the number to our office to call with any questions that she may have prior to the test.

## 2018-02-18 NOTE — Assessment & Plan Note (Signed)
Noted on EKG.  Undetermined timeframe for this being present.  She will have cardiac evaluation prior to surgery.

## 2018-02-18 NOTE — Patient Instructions (Addendum)
Nice to see you. We will get you set up with cardiology for evaluation.  We will contact you with your lab results.  If you develop chest pain or persistent dyspnea please go to the ED. Cardiology will call you to set up a stress test.

## 2018-02-19 ENCOUNTER — Telehealth: Payer: Self-pay | Admitting: Family Medicine

## 2018-02-19 LAB — CBC
HCT: 38.7 % (ref 36.0–46.0)
Hemoglobin: 13.1 g/dL (ref 12.0–15.0)
MCHC: 33.8 g/dL (ref 30.0–36.0)
MCV: 87.5 fl (ref 78.0–100.0)
Platelets: 199 10*3/uL (ref 150.0–400.0)
RBC: 4.42 Mil/uL (ref 3.87–5.11)
RDW: 13.9 % (ref 11.5–15.5)
WBC: 6.8 10*3/uL (ref 4.0–10.5)

## 2018-02-19 LAB — COMPREHENSIVE METABOLIC PANEL
ALT: 15 U/L (ref 0–35)
AST: 16 U/L (ref 0–37)
Albumin: 4.1 g/dL (ref 3.5–5.2)
Alkaline Phosphatase: 69 U/L (ref 39–117)
BUN: 20 mg/dL (ref 6–23)
CHLORIDE: 103 meq/L (ref 96–112)
CO2: 31 meq/L (ref 19–32)
CREATININE: 0.96 mg/dL (ref 0.40–1.20)
Calcium: 9.1 mg/dL (ref 8.4–10.5)
GFR: 60.28 mL/min (ref >60.00)
GLUCOSE: 101 mg/dL — AB (ref 70–99)
Potassium: 3.9 mEq/L (ref 3.5–5.1)
SODIUM: 141 meq/L (ref 135–145)
Total Bilirubin: 0.3 mg/dL (ref 0.2–1.2)
Total Protein: 6.8 g/dL (ref 6.0–8.3)

## 2018-02-19 NOTE — Telephone Encounter (Signed)
Left detailed message stating that patient was recommended to have cardiac eval prior to surgery

## 2018-02-19 NOTE — Telephone Encounter (Signed)
Copied from [place] [phone]. Topic: Quick Communication - See Telephone Encounter >> Feb 19, 2018 11:34 AM Boyd Kerbs wrote: CRM for notification.   Sherri from Dr. Archie Endo office called about the pre-surgery clarence.  Please fax the form back  Fax # [phone] attn; Sherri  Her surgery is 4/17.     See Telephone encounter for: 02/19/18.

## 2018-02-20 ENCOUNTER — Encounter
Admission: RE | Admit: 2018-02-20 | Discharge: 2018-02-20 | Disposition: A | Discharge status: Home / Self Care | Payer: Medicare HMO | Source: Ambulatory Visit | Attending: Cardiovascular Disease | Admitting: Cardiovascular Disease

## 2018-02-20 ENCOUNTER — Telehealth: Payer: Self-pay | Admitting: Family Medicine

## 2018-02-20 DIAGNOSIS — E039 Hypothyroidism, unspecified: Secondary | ICD-10-CM

## 2018-02-20 DIAGNOSIS — Z01810 Encounter for preprocedural cardiovascular examination: Secondary | ICD-10-CM | POA: Diagnosis not present

## 2018-02-20 DIAGNOSIS — R0602 Shortness of breath: Secondary | ICD-10-CM | POA: Diagnosis not present

## 2018-02-20 DIAGNOSIS — R9431 Abnormal electrocardiogram [ECG] [EKG]: Secondary | ICD-10-CM

## 2018-02-20 MED ORDER — REGADENOSON 0.4 MG/5ML IV SOLN
0.4000 mg | Freq: Once | INTRAVENOUS | Status: AC
  Administered 2018-02-20: 0.4 mg via INTRAVENOUS

## 2018-02-20 MED ORDER — TECHNETIUM TC 99M TETROFOSMIN IV KIT
31.2400 | PACK | Freq: Once | INTRAVENOUS | Status: AC | PRN
  Administered 2018-02-20: 31.24 via INTRAVENOUS

## 2018-02-20 MED ORDER — TECHNETIUM TC 99M TETROFOSMIN IV KIT
13.6800 | PACK | Freq: Once | INTRAVENOUS | Status: AC | PRN
Start: 2018-02-20 — End: 2018-02-20
  Administered 2018-02-20: 13.68 via INTRAVENOUS

## 2018-02-20 NOTE — Telephone Encounter (Signed)
I apologize for not checking this at her visit last week. It looks like she was to have had this rechecked in March though the [facility] did not schedule her when they spoke with her previously and I did not catch this at her visit. Please contact her to get her on the schedule for today or Monday to check her TSH. Order has been placed. Thanks.

## 2018-02-20 NOTE — Telephone Encounter (Signed)
Pt notified & lab appt scheduled 

## 2018-02-20 NOTE — Telephone Encounter (Unsigned)
Copied from [place] [id number]. Topic: Quick Communication - Lab Results >> Feb 20, 2018  7:04 AM Hewitt Shorts wrote: Pt is needing to talk with dr Caryl Bis regarding her TSH she doesn't believe it is in normal range -she has a stress test today at 730 and suirgery next week and would like to have everry thing straight  Best number [phone] after 12

## 2018-02-20 NOTE — Telephone Encounter (Signed)
Please advise 

## 2018-02-21 DIAGNOSIS — Z6836 Body mass index (BMI) 36.0-36.9, adult: Secondary | ICD-10-CM | POA: Diagnosis not present

## 2018-02-21 DIAGNOSIS — R609 Edema, unspecified: Secondary | ICD-10-CM | POA: Diagnosis not present

## 2018-02-21 DIAGNOSIS — K219 Gastro-esophageal reflux disease without esophagitis: Secondary | ICD-10-CM | POA: Diagnosis not present

## 2018-02-21 DIAGNOSIS — M810 Age-related osteoporosis without current pathological fracture: Secondary | ICD-10-CM | POA: Diagnosis not present

## 2018-02-21 DIAGNOSIS — G4730 Sleep apnea, unspecified: Secondary | ICD-10-CM | POA: Diagnosis not present

## 2018-02-21 DIAGNOSIS — E039 Hypothyroidism, unspecified: Secondary | ICD-10-CM | POA: Diagnosis not present

## 2018-02-21 DIAGNOSIS — Z7983 Long term (current) use of bisphosphonates: Secondary | ICD-10-CM | POA: Diagnosis not present

## 2018-02-21 DIAGNOSIS — J302 Other seasonal allergic rhinitis: Secondary | ICD-10-CM | POA: Diagnosis not present

## 2018-02-21 DIAGNOSIS — I10 Essential (primary) hypertension: Secondary | ICD-10-CM | POA: Diagnosis not present

## 2018-02-21 LAB — NM MYOCAR MULTI W/SPECT W/WALL MOTION / EF
CHL CUP MPHR: 46 {beats}/min
CHL CUP NUCLEAR SDS: 0
CHL CUP NUCLEAR SRS: 8
CHL CUP NUCLEAR SSS: 7
CHL CUP RESTING HR STRESS: 60 {beats}/min
CSEPEDS: 0 s
CSEPEW: 1 METS
CSEPPHR: 105 {beats}/min
Exercise duration (min): 0 min
LV dias vol: 76 mL (ref 46–106)
LV sys vol: 33 mL
NUC STRESS TID: 1.02
Percent HR: 71 %

## 2018-02-23 ENCOUNTER — Other Ambulatory Visit (INDEPENDENT_AMBULATORY_CARE_PROVIDER_SITE_OTHER): Payer: Medicare HMO

## 2018-02-23 DIAGNOSIS — G4733 Obstructive sleep apnea (adult) (pediatric): Secondary | ICD-10-CM | POA: Diagnosis not present

## 2018-02-23 DIAGNOSIS — E039 Hypothyroidism, unspecified: Secondary | ICD-10-CM | POA: Diagnosis not present

## 2018-02-23 LAB — TSH: TSH: 3.4 u[IU]/mL (ref 0.35–4.50)

## 2018-02-23 NOTE — Progress Notes (Signed)
This is an addendum to my prior note.  Patient underwent stress testing which was low risk.  Given low risk scan she is low risk from a cardiac perspective and I suspect her mild dyspnea is deconditioning related.  From a medical standpoint she is optimized at this point.  Surgical clearance will be faxed to her surgeon.

## 2018-02-25 ENCOUNTER — Telehealth: Payer: Self-pay | Admitting: Cardiovascular Disease

## 2018-02-25 DIAGNOSIS — M94262 Chondromalacia, left knee: Secondary | ICD-10-CM | POA: Diagnosis not present

## 2018-02-25 DIAGNOSIS — S83242A Other tear of medial meniscus, current injury, left knee, initial encounter: Secondary | ICD-10-CM | POA: Diagnosis not present

## 2018-02-25 DIAGNOSIS — M23232 Derangement of other medial meniscus due to old tear or injury, left knee: Secondary | ICD-10-CM | POA: Diagnosis not present

## 2018-02-25 DIAGNOSIS — M1712 Unilateral primary osteoarthritis, left knee: Secondary | ICD-10-CM | POA: Diagnosis not present

## 2018-02-25 DIAGNOSIS — G8918 Other acute postprocedural pain: Secondary | ICD-10-CM | POA: Diagnosis not present

## 2018-02-25 DIAGNOSIS — S83282A Other tear of lateral meniscus, current injury, left knee, initial encounter: Secondary | ICD-10-CM | POA: Diagnosis not present

## 2018-02-25 DIAGNOSIS — M23262 Derangement of other lateral meniscus due to old tear or injury, left knee: Secondary | ICD-10-CM | POA: Diagnosis not present

## 2018-02-25 NOTE — Telephone Encounter (Signed)
Left voicemail message and number requested to call has busy signal.

## 2018-02-25 NOTE — Telephone Encounter (Signed)
Spoke with Kenney Houseman over at [facility] and she states that they reviewed stress test and have the information that they need. She was appreciative for the call with no further questions at this time.

## 2018-02-25 NOTE — Telephone Encounter (Signed)
Pt is waiting (on the table) to go into surgery and Dr. Noemi Chapel needs a interpreted stress test. Please call ASAP. Fax [phone] or [phone]

## 2018-03-04 DIAGNOSIS — S83242D Other tear of medial meniscus, current injury, left knee, subsequent encounter: Secondary | ICD-10-CM | POA: Diagnosis not present

## 2018-03-04 DIAGNOSIS — S83282D Other tear of lateral meniscus, current injury, left knee, subsequent encounter: Secondary | ICD-10-CM | POA: Diagnosis not present

## 2018-03-25 DIAGNOSIS — G4733 Obstructive sleep apnea (adult) (pediatric): Secondary | ICD-10-CM | POA: Diagnosis not present

## 2018-03-25 DIAGNOSIS — S83242D Other tear of medial meniscus, current injury, left knee, subsequent encounter: Secondary | ICD-10-CM | POA: Diagnosis not present

## 2018-03-25 DIAGNOSIS — S83282D Other tear of lateral meniscus, current injury, left knee, subsequent encounter: Secondary | ICD-10-CM | POA: Diagnosis not present

## 2018-04-14 ENCOUNTER — Ambulatory Visit: Payer: Medicare HMO | Admitting: Cardiovascular Disease

## 2018-04-20 ENCOUNTER — Ambulatory Visit
Admission: RE | Admit: 2018-04-20 | Discharge: 2018-04-20 | Disposition: A | Discharge status: Home / Self Care | Payer: Medicare HMO | Source: Ambulatory Visit | Attending: Family Medicine | Admitting: Family Medicine

## 2018-04-20 DIAGNOSIS — Z1239 Encounter for other screening for malignant neoplasm of breast: Secondary | ICD-10-CM

## 2018-04-20 DIAGNOSIS — Z1231 Encounter for screening mammogram for malignant neoplasm of breast: Secondary | ICD-10-CM | POA: Insufficient documentation

## 2018-04-25 DIAGNOSIS — G4733 Obstructive sleep apnea (adult) (pediatric): Secondary | ICD-10-CM | POA: Diagnosis not present

## 2018-05-06 DIAGNOSIS — H5203 Hypermetropia, bilateral: Secondary | ICD-10-CM | POA: Diagnosis not present

## 2018-05-25 DIAGNOSIS — G4733 Obstructive sleep apnea (adult) (pediatric): Secondary | ICD-10-CM | POA: Diagnosis not present

## 2018-06-16 DIAGNOSIS — R69 Illness, unspecified: Secondary | ICD-10-CM | POA: Diagnosis not present

## 2018-06-22 DIAGNOSIS — H18832 Recurrent erosion of cornea, left eye: Secondary | ICD-10-CM | POA: Diagnosis not present

## 2018-06-25 DIAGNOSIS — G4733 Obstructive sleep apnea (adult) (pediatric): Secondary | ICD-10-CM | POA: Diagnosis not present

## 2018-08-03 ENCOUNTER — Other Ambulatory Visit: Payer: Self-pay | Admitting: Family Medicine

## 2018-08-03 DIAGNOSIS — R1013 Epigastric pain: Secondary | ICD-10-CM

## 2018-08-21 DIAGNOSIS — M48062 Spinal stenosis, lumbar region with neurogenic claudication: Secondary | ICD-10-CM | POA: Diagnosis not present

## 2018-08-21 DIAGNOSIS — M5136 Other intervertebral disc degeneration, lumbar region: Secondary | ICD-10-CM | POA: Diagnosis not present

## 2018-08-21 DIAGNOSIS — M5416 Radiculopathy, lumbar region: Secondary | ICD-10-CM | POA: Diagnosis not present

## 2018-09-18 DIAGNOSIS — M48062 Spinal stenosis, lumbar region with neurogenic claudication: Secondary | ICD-10-CM | POA: Diagnosis not present

## 2018-09-18 DIAGNOSIS — M5136 Other intervertebral disc degeneration, lumbar region: Secondary | ICD-10-CM | POA: Diagnosis not present

## 2018-09-18 DIAGNOSIS — M5416 Radiculopathy, lumbar region: Secondary | ICD-10-CM | POA: Diagnosis not present

## 2018-10-19 ENCOUNTER — Ambulatory Visit (INDEPENDENT_AMBULATORY_CARE_PROVIDER_SITE_OTHER): Payer: Medicare HMO

## 2018-10-19 VITALS — BP 132/72 | HR 74 | Temp 97.8°F | Resp 16 | Ht 61.0 in | Wt 185.8 lb

## 2018-10-19 DIAGNOSIS — Z23 Encounter for immunization: Secondary | ICD-10-CM

## 2018-10-19 DIAGNOSIS — Z0000 Encounter for general adult medical examination without abnormal findings: Secondary | ICD-10-CM

## 2018-10-19 DIAGNOSIS — M48062 Spinal stenosis, lumbar region with neurogenic claudication: Secondary | ICD-10-CM | POA: Diagnosis not present

## 2018-10-19 DIAGNOSIS — M5136 Other intervertebral disc degeneration, lumbar region: Secondary | ICD-10-CM | POA: Diagnosis not present

## 2018-10-19 DIAGNOSIS — M4306 Spondylolysis, lumbar region: Secondary | ICD-10-CM | POA: Diagnosis not present

## 2018-10-19 DIAGNOSIS — M5416 Radiculopathy, lumbar region: Secondary | ICD-10-CM | POA: Diagnosis not present

## 2018-10-19 NOTE — Patient Instructions (Addendum)
  Cathy Coffey , Thank you for taking time to come for your Medicare Wellness Visit. I appreciate your ongoing commitment to your health goals. Please review the following plan we discussed and let me know if I can assist you in the future.   Follow up as needed.    Bring a copy of your [place] and/or Living Will to be scanned into chart upon completion.  Have a great day!  These are the goals we discussed: Goals    . Increase physical activity     Physical therapy as directed       This is a list of the screening recommended for you and due dates:  Health Maintenance  Topic Date Due  . Tetanus Vaccine  06/30/1960  . Colon Cancer Screening  10/01/2020  . Flu Shot  Completed  . DEXA scan (bone density measurement)  Completed  . Pneumonia vaccines  Completed

## 2018-10-19 NOTE — Progress Notes (Signed)
Subjective:   Cathy Coffey is a 77 y.o. female who presents for Medicare Annual (Subsequent) preventive examination.  Review of Systems:  No ROS.  Medicare Wellness Visit. Additional risk factors are reflected in the social history. Cardiac Risk Factors include: hypertension;advanced age (>69men, >35 women);obesity (BMI >30kg/m2)     Objective:     Vitals: BP 132/72 (BP Location: Right Arm, Patient Position: Sitting, Cuff Size: Normal)   Pulse 74   Temp 97.8 F (36.6 C) (Oral)   Resp 16   Ht 5\' 1"  (1.549 m)   Wt 185 lb 12.8 oz (84.3 kg)   SpO2 95%   BMI 35.11 kg/m   Body mass index is 35.11 kg/m.  Advanced Directives 10/19/2018 10/16/2017 02/19/2017 02/05/2017 10/16/2016  Does Patient Have a Medical Advance Directive? No No No No No  Would patient like information on creating a medical advance directive? Yes (MAU/Ambulatory/Procedural Areas - Information given) No - Patient declined - - Yes (MAU/Ambulatory/Procedural Areas - Information given)    Tobacco Social History   Tobacco Use  Smoking Status Never Smoker  Smokeless Tobacco Never Used     Counseling given: Not Answered   Clinical Intake:  Pre-visit preparation completed: Yes  Pain Score: 6  Pain Type: Chronic pain Pain Location: Back Pain Descriptors / Indicators: Aching, Discomfort, Grimacing, Restless Pain Frequency: Constant Pain Relieving Factors: Rest.  Injection.  Medication.  Awaiting PT.  Effect of Pain on Daily Activities: She paces herself.   Pain Relieving Factors: Rest.  Injection.  Medication.  Awaiting PT.   Nutritional Status: BMI > 30  Obese Diabetes: No  How often do you need to have someone help you when you read instructions, pamphlets, or other written materials from your doctor or pharmacy?: 1 - Never  Interpreter Needed?: No     Past Medical History:  Diagnosis Date  . Asthma   . Diverticulosis   . Hypertension   . Hypothyroidism   . IBS (irritable bowel syndrome)   .  Lichen planus   . Pancreatitis due to common bile duct stone 2000   Past Surgical History:  Procedure Laterality Date  . CHOLECYSTECTOMY    . COLONOSCOPY  2011  . HERNIA REPAIR    . MOUTH SURGERY  09/2016  . TUBAL LIGATION     Family History  Problem Relation Age of Onset  . Hypothyroidism Mother   . Hypertension Mother   . Dementia Father   . Dementia Paternal Grandmother    Social History   Socioeconomic History  . Marital status: Divorced    Spouse name: Not on file  . Number of children: Not on file  . Years of education: Not on file  . Highest education level: Not on file  Occupational History  . Not on file  Social Needs  . Financial resource strain: Not hard at all  . Food insecurity:    Worry: Never true    Inability: Never true  . Transportation needs:    Medical: No    Non-medical: No  Tobacco Use  . Smoking status: Never Smoker  . Smokeless tobacco: Never Used  Substance and Sexual Activity  . Alcohol use: No  . Drug use: No  . Sexual activity: Never  Lifestyle  . Physical activity:    Days per week: Not on file    Minutes per session: Not on file  . Stress: Not at all  Relationships  . Social connections:    Talks on phone: Not on  file    Gets together: Not on file    Attends religious service: Not on file    Active member of club or organization: Not on file    Attends meetings of clubs or organizations: Not on file    Relationship status: Not on file  Other Topics Concern  . Not on file  Social History Narrative  . Not on file    Outpatient Encounter Medications as of 10/19/2018  Medication Sig  . Alum & Mag Hydroxide-Simeth (ANTACID ANTI-GAS PO) Take by mouth.  . Ascorbic Acid (VITAMIN C) 100 MG tablet Take 100 mg by mouth daily.  . B Complex-C (SUPER B COMPLEX PO) Take 1 capsule by mouth.  Marland Kitchen BIOTIN 5000 PO Take by mouth.  . Calcium Carbonate-Vitamin D (CALCIUM-VITAMIN D) 500-200 MG-UNIT per tablet Take 3 tablets by mouth daily.   Marland Kitchen  desonide (DESOWEN) 0.05 % cream Apply topically 2 (two) times daily.  Marland Kitchen DIGESTIVE AIDS MIXTURE PO Take by mouth.  . furosemide (LASIX) 20 MG tablet Take 1 tablet (20 mg total) by mouth daily.  . Glucosamine HCl 1000 MG TABS Take 1 tablet by mouth.  . levothyroxine (SYNTHROID, LEVOTHROID) 100 MCG tablet Take 1 tablet (100 mcg total) by mouth daily.  . Magnesium 250 MG TABS Take 500 mg by mouth.   . Misc Natural Products (ADVANCED JOINT RELIEF PO) Take by mouth.  . Multiple Vitamins-Minerals (PRESERVISION AREDS PO) Take by mouth.  Marland Kitchen NIACIN, ANTIHYPERLIPIDEMIC, PO Take 500 mg by mouth.  . Omega-3 Fatty Acids (FISH OIL) 1000 MG CPDR Take 3,000 mg by mouth daily.   . pantoprazole (PROTONIX) 40 MG tablet Take 1 tablet (40 mg total) by mouth daily.  . pantoprazole (PROTONIX) 40 MG tablet TAKE 1 TABLET TWICE A DAY  . POTASSIUM PO Take 600 mg by mouth.  . Probiotic Product (TRUBIOTICS PO) Take 1 capsule by mouth.  . vitamin A 10000 UNIT capsule Take 10,000 Units by mouth daily.  . [DISCONTINUED] alendronate (FOSAMAX) 70 MG tablet Take 1 tablet (70 mg total) by mouth every 7 (seven) days. Take with a full glass of water on an empty stomach.  . [DISCONTINUED] Coenzyme Q10 (COQ-10) 30 MG CAPS Take by mouth.   No facility-administered encounter medications on file as of 10/19/2018.     Activities of Daily Living In your present state of health, do you have any difficulty performing the following activities: 10/19/2018  Hearing? N  Vision? N  Difficulty concentrating or making decisions? Y  Comment Notes she has difficulty with short term memory.   Walking or climbing stairs? Y  Comment Difficulty walking long distances and climbing stairs. Chronic back pain  Dressing or bathing? N  Doing errands, shopping? N  Preparing Food and eating ? N  Using the Toilet? N  In the past six months, have you accidently leaked urine? Y  Comment Managed with daily liner.   Do you have problems with loss of bowel  control? N  Managing your Medications? N  Managing your Finances? N  Housekeeping or managing your Housekeeping? Y  Comment Family assists.   Some recent data might be hidden    Patient Care Team: Leone Haven, MD as PCP - General (Family Medicine)    Assessment:   This is a routine wellness examination for Xitlalli. The goal of the wellness visit is to assist the patient how to close the gaps in care and create a preventative care plan for the patient.   The roster  of all physicians providing medical care to patient is listed in the Snapshot section of the chart.  Taking calcium VIT D as appropriate/Osteoporosis  reviewed.  Reports no longer taking fosamax.   Safety issues reviewed; Smoke and carbon monoxide detectors in the home. No firearms in the home. Wears seatbelts when driving or riding with others. No violence in the home.  They do not have excessive sun exposure.  Discussed the need for sun protection: hats, long sleeves and the use of sunscreen if there is significant sun exposure.  Patient is alert, normal appearance, oriented to person/place/and time.  Correctly identified the president of the [place] and recalls of 3/3 words. Performs simple calculations and can read correct time from watch face.  Displays appropriate judgement.  Requests baseline neurocognitive testing.  Reports she has difficulty with short term memory and has difficulty focusing.  She grew up in this area and sometimes finds herself disoriented with direction while driving, repeats herself with conversation, and family history of dementia. Appointment scheduled with pcp for referral follow up.   Chronic back pain.  Deferred to pcp for follow up. Declines follow up today. Keep all routine maintenance appointments.   No failures at ADL's or IADL's.    BMI- discussed the importance of a healthy diet, water intake and the benefits of aerobic exercise. Educational material provided.   Dental- every  6 months.  High dose influenza administered L deltoid, tolerated well. Educational material provided.  TDAP vaccine deferred per patient preference.  Follow up with insurance.  Educational material provided.  Exercise Activities and Dietary recommendations Current Exercise Habits: Home exercise routine, Type of exercise: strength training/weights;stretching(bed exercises), Time (Minutes): 20, Frequency (Times/Week): 6, Weekly Exercise (Minutes/Week): 120, Intensity: Mild  Goals    . Increase physical activity     Physical therapy as directed       Fall Risk Fall Risk  10/19/2018 10/16/2017 02/19/2017 10/16/2016 06/03/2016  Falls in the past year? 0 Yes No No No  Number falls in past yr: - 1 - - -   Depression Screen PHQ 2/9 Scores 10/19/2018 10/16/2017 02/19/2017 10/16/2016  PHQ - 2 Score 0 0 0 0  PHQ- 9 Score - 0 - -     Cognitive Function MMSE - Mini Mental State Exam 10/19/2018 10/16/2016  Orientation to time 5 5  Orientation to Place 5 5  Registration 3 3  Attention/ Calculation 4 5  Recall 3 3  Language- name 2 objects 2 2  Language- repeat 1 1  Language- follow 3 step command 3 3  Language- read & follow direction 1 1  Write a sentence 1 1  Copy design 1 1  Total score 29 30     6CIT Screen 10/16/2017  What Year? 0 points  What month? 0 points  What time? 0 points  Count back from 20 0 points  Months in reverse 0 points  Repeat phrase 0 points  Total Score 0    Immunization History  Administered Date(s) Administered  . Influenza Split 10/01/2012  . Influenza, High Dose Seasonal PF 08/21/2016, 09/02/2017, 10/19/2018  . Influenza,inj,Quad PF,6+ Mos 07/27/2013, 11/02/2014, 11/09/2015  . Pneumococcal Conjugate-13 11/02/2014  . Pneumococcal Polysaccharide-23 10/01/2012   Screening Tests Health Maintenance  Topic Date Due  . TETANUS/TDAP  06/30/1962  . COLONOSCOPY  10/01/2020  . INFLUENZA VACCINE  Completed  . DEXA SCAN  Completed  . PNA vac Low Risk Adult   Completed      Plan:  End of life planning; Advance aging; Advanced directives discussed. Copy of current HCPOA/Living Will requested upon completion.    Keep all routine maintenance appointments.    I have personally reviewed and noted the following in the patient's chart:   . Medical and social history . Use of alcohol, tobacco or illicit drugs  . Current medications and supplements . Functional ability and status . Nutritional status . Physical activity . Advanced directives . List of other physicians . Hospitalizations, surgeries, and ER visits in previous 12 months . Vitals . Screenings to include cognitive, depression, and falls . Referrals and appointments  In addition, I have reviewed and discussed with patient certain preventive protocols, quality metrics, and best practice recommendations. A written personalized care plan for preventive services as well as general preventive health recommendations were provided to patient.     Varney Biles, LPN  [id number]

## 2018-10-21 ENCOUNTER — Ambulatory Visit: Payer: Medicare HMO | Admitting: Family Medicine

## 2018-10-21 DIAGNOSIS — Z0289 Encounter for other administrative examinations: Secondary | ICD-10-CM

## 2018-10-22 NOTE — Progress Notes (Signed)
I have reviewed the above note and agree.  The patient did not come to her follow-up visit for her neurocognitive issues.  Please contact her and get her scheduled for an office visit as I will need to determine if she needs to see a neurologist or a psychologist.  [name], M.D.

## 2018-10-26 ENCOUNTER — Encounter: Payer: Self-pay | Admitting: Family Medicine

## 2018-10-26 ENCOUNTER — Ambulatory Visit (INDEPENDENT_AMBULATORY_CARE_PROVIDER_SITE_OTHER): Payer: Medicare HMO | Admitting: Family Medicine

## 2018-10-26 VITALS — BP 160/90 | HR 77 | Temp 98.0°F | Ht 61.0 in | Wt 184.4 lb

## 2018-10-26 DIAGNOSIS — G8929 Other chronic pain: Secondary | ICD-10-CM

## 2018-10-26 DIAGNOSIS — I10 Essential (primary) hypertension: Secondary | ICD-10-CM | POA: Diagnosis not present

## 2018-10-26 DIAGNOSIS — F32A Depression, unspecified: Secondary | ICD-10-CM | POA: Insufficient documentation

## 2018-10-26 DIAGNOSIS — F321 Major depressive disorder, single episode, moderate: Secondary | ICD-10-CM

## 2018-10-26 DIAGNOSIS — F329 Major depressive disorder, single episode, unspecified: Secondary | ICD-10-CM | POA: Insufficient documentation

## 2018-10-26 DIAGNOSIS — R413 Other amnesia: Secondary | ICD-10-CM | POA: Diagnosis not present

## 2018-10-26 DIAGNOSIS — M545 Low back pain: Secondary | ICD-10-CM

## 2018-10-26 DIAGNOSIS — R69 Illness, unspecified: Secondary | ICD-10-CM | POA: Diagnosis not present

## 2018-10-26 LAB — TSH: TSH: 2.7 u[IU]/mL (ref 0.35–4.50)

## 2018-10-26 LAB — VITAMIN B12: Vitamin B-12: 411 pg/mL (ref 211–911)

## 2018-10-26 NOTE — Assessment & Plan Note (Signed)
This is a chronic issue.  This certainly could be contributing to the other issues discussed today.  She will keep her appointment with her specialist.

## 2018-10-26 NOTE — Progress Notes (Signed)
[name], MD Phone: [phone]  Cathy Coffey is a 77 y.o. female who presents today for f/u.  CC: Memory difficulty, depression, elevated blood pressure, spinal stenosis  Patient notes over the last 1 to 2 months she notes her memory has been worse.  She can not come up with stuff when thinking about it as she used to.  She has to hunt for a word.  She forgot our prior appointment here.  Her thoughts just do not come together as they did.  She does note some possible depression.  She notes she has been more tearful than prior.  She notes her son is getting married and she is having to move to the basement where she lives with them.  She denies SI.  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  She does note dealing with chronic back pain for which she is seeing a specialist and is set up for another injection this month.  She thinks that may be contributing to how she is feeling.  She does note in the past she took a vitamin B12 supplement though she was never on injectable vitamin B12.  She does take a vitamin B complex currently.  Previously she did have an elevated B12 and when rechecked off of B12 supplementation this was in the normal range.  She was previously tried on multiple depression medications by her prior physician though most of them made her drowsy.  She notes her blood pressure is not typically elevated.  She notes no chest pain or shortness of breath.  Social History   Tobacco Use  Smoking Status Never Smoker  Smokeless Tobacco Never Used     ROS see history of present illness  Objective  Physical Exam Vitals:   10/26/18 1427 10/26/18 1447  BP: (!) 140/98 (!) 160/90  Pulse:    Temp:    SpO2:      BP Readings from Last 3 Encounters:  10/26/18 (!) 160/90  10/19/18 132/72  02/18/18 120/72   Wt Readings from Last 3 Encounters:  10/26/18 184 lb 6.4 oz (83.6 kg)  10/19/18 185 lb 12.8 oz (84.3 kg)  02/18/18 190 lb 6.4 oz (86.4  kg)    Physical Exam Constitutional:      General: She is not in acute distress.    Appearance: She is not diaphoretic.  Cardiovascular:     Rate and Rhythm: Normal rate and regular rhythm.     Heart sounds: Normal heart sounds.  Pulmonary:     Effort: Pulmonary effort is normal.     Breath sounds: Normal breath sounds.  Musculoskeletal:     Right lower leg: No edema.     Left lower leg: No edema.  Skin:    General: Skin is warm and dry.  Neurological:     Mental Status: She is alert and oriented to person, place, and time.     Comments: CN 2-12 intact, 5/5 strength in bilateral biceps, triceps, grip, quads, hamstrings, plantar and dorsiflexion, sensation to light touch intact in bilateral UE and LE, normal gait      Assessment/Plan: Please see individual problem list.  Memory difficulty I suspect this is related to depression though we will check lab work to evaluate thyroid dysfunction and B12 deficiency for causes.  If not improving with treatment for depression would consider neurology evaluation.  Depression, major, single episode, moderate ([place]) Patient symptoms most consistent with depression.  She does have a positive PHQ 9.  She reports numerous antidepressants have been tried in the past mostly with little effect or side effects.  We will check with our clinical pharmacist regarding the use of Cymbalta in her this patient given her current elevated blood pressure.  If this is okay to use we will prescribe this.  She will return in 6 weeks.  Given return precautions.  Hypertension Elevated today though had been well controlled.  She is only on Lasix.  We will have her return in 1 week for BP check with nursing.  Lumbago This is a chronic issue.  This certainly could be contributing to the other issues discussed today.  She will keep her appointment with her specialist.   Orders Placed This Encounter  Procedures  . TSH  . B12    No orders of the defined types  were placed in this encounter.    [name], MD [place]

## 2018-10-26 NOTE — Assessment & Plan Note (Signed)
Patient symptoms most consistent with depression.  She does have a positive PHQ 9.  She reports numerous antidepressants have been tried in the past mostly with little effect or side effects.  We will check with our clinical pharmacist regarding the use of Cymbalta in her this patient given her current elevated blood pressure.  If this is okay to use we will prescribe this.  She will return in 6 weeks.  Given return precautions.

## 2018-10-26 NOTE — Assessment & Plan Note (Signed)
Elevated today though had been well controlled.  She is only on Lasix.  We will have her return in 1 week for BP check with nursing.

## 2018-10-26 NOTE — Assessment & Plan Note (Signed)
I suspect this is related to depression though we will check lab work to evaluate thyroid dysfunction and B12 deficiency for causes.  If not improving with treatment for depression would consider neurology evaluation.

## 2018-10-26 NOTE — Patient Instructions (Addendum)
Nice to see you. We will check lab work today and contact you with the results. We will be placing you on a medication for depression.  We will contact you later today or tomorrow once we determine the best option for you. If you develop thoughts of harming yourself please seek medical attention immediately.

## 2018-10-27 DIAGNOSIS — M47816 Spondylosis without myelopathy or radiculopathy, lumbar region: Secondary | ICD-10-CM | POA: Diagnosis not present

## 2018-10-29 ENCOUNTER — Telehealth: Payer: Self-pay | Admitting: Family Medicine

## 2018-10-29 NOTE — Telephone Encounter (Signed)
Please let the patient know I heard back from the clinical pharmacist.  They noted that Cymbalta would be okay to take though we would need to monitor the patient's blood pressure.  She needs to be checking her blood pressure at home while starting this medication.  If she notes that it stays elevated or goes up she needs to let us know.  Once you speak with her I can send the Cymbalta to her pharmacy.  Thanks.

## 2018-10-29 NOTE — Telephone Encounter (Signed)
-----   Message from Rudean Haskell, [facility] sent at 10/27/2018  7:43 PM EST ----- Hi Dr. Caryl Bis, I am covering for Catie today and can help answer this.  Per Cymbalta PI, increased blood pressure occurs in ~2% of patients.   No statistically significant differences in frequency of sustained elevations of blood pressure observed in clinical trials when compared with placebo.  Would recommend to monitor BP at the beginning of treatment and periodically throughout. Hope this helps.  Let me know if you have any other questions! Colleen     ----- Message ----- From: Leone Haven, MD Sent: 10/26/2018   5:56 PM EST To: De Hollingshead, [facility]  Hey Catie,  This patient is having issues with depression and has tried numerous SSRIs in the past with little benefit or experienced side effects. She has chronic pain issues and I would like to try cymbalta, though her BP was elevated today. It looks like there is a caution in up-to-date regarding cymbalta and HTN. How much of an issue is this?   Randall Hiss

## 2018-10-30 MED ORDER — DULOXETINE HCL 30 MG PO CPEP: 30.0000 mg | ORAL_CAPSULE | Freq: Every day | ORAL | 3 refills | Status: DC

## 2018-10-30 NOTE — Telephone Encounter (Signed)
Called and spoke with patient. Pt advised and voiced understanding. Pt has been scheduled for BP check for 12/02/2017.

## 2018-10-30 NOTE — Telephone Encounter (Addendum)
Sent to pharmacy.  Please see if we can get her set up for a blood pressure check in 2 to 3 weeks with nursing.

## 2018-10-30 NOTE — Addendum Note (Signed)
Addended by: Leone Haven on: 10/30/2018 11:34 AM   Modules accepted: Orders

## 2018-10-30 NOTE — Telephone Encounter (Signed)
Called and spoke with patient. Pt advised and voiced understanding. Pt would like to start medication send this into [facility] in [place] on [address].  Sent to PCP

## 2018-11-21 ENCOUNTER — Other Ambulatory Visit: Payer: Self-pay | Admitting: Family Medicine

## 2018-11-23 ENCOUNTER — Other Ambulatory Visit: Payer: Self-pay | Admitting: Family Medicine

## 2018-11-25 NOTE — Telephone Encounter (Signed)
Sent to pharmacy 

## 2018-12-02 ENCOUNTER — Ambulatory Visit: Payer: Medicare HMO

## 2018-12-08 ENCOUNTER — Ambulatory Visit (INDEPENDENT_AMBULATORY_CARE_PROVIDER_SITE_OTHER): Payer: Medicare HMO | Admitting: Family Medicine

## 2018-12-08 ENCOUNTER — Encounter: Payer: Self-pay | Admitting: Family Medicine

## 2018-12-08 DIAGNOSIS — M5416 Radiculopathy, lumbar region: Secondary | ICD-10-CM | POA: Diagnosis not present

## 2018-12-08 DIAGNOSIS — M5136 Other intervertebral disc degeneration, lumbar region: Secondary | ICD-10-CM | POA: Diagnosis not present

## 2018-12-08 DIAGNOSIS — I10 Essential (primary) hypertension: Secondary | ICD-10-CM | POA: Diagnosis not present

## 2018-12-08 DIAGNOSIS — R413 Other amnesia: Secondary | ICD-10-CM | POA: Diagnosis not present

## 2018-12-08 DIAGNOSIS — M4306 Spondylolysis, lumbar region: Secondary | ICD-10-CM | POA: Diagnosis not present

## 2018-12-08 DIAGNOSIS — M48062 Spinal stenosis, lumbar region with neurogenic claudication: Secondary | ICD-10-CM | POA: Diagnosis not present

## 2018-12-08 DIAGNOSIS — R69 Illness, unspecified: Secondary | ICD-10-CM | POA: Diagnosis not present

## 2018-12-08 DIAGNOSIS — F321 Major depressive disorder, single episode, moderate: Secondary | ICD-10-CM | POA: Diagnosis not present

## 2018-12-08 NOTE — Assessment & Plan Note (Signed)
Currently well controlled.  I encouraged her to continue to monitor her salt intake.

## 2018-12-08 NOTE — Patient Instructions (Signed)
Nice to see you. I am glad you are feeling better. If you continue to have sleep issues with the Cymbalta please let us know. Please continue to periodically monitor your blood pressure several times a week.

## 2018-12-08 NOTE — Assessment & Plan Note (Signed)
This has improved.  I suspect this was related to her depression.

## 2018-12-08 NOTE — Progress Notes (Signed)
  [name], MD Phone: [phone]  Cathy Coffey is a 78 y.o. female who presents today for follow-up.  CC: Depression, sleep difficulty, memory issues, hypertension  Patient notes her depression is quite a bit better.  She had a number of life events that got out of the way and that has helped.  She is currently on Cymbalta and that has been beneficial.  No SI.  No anxiety.  She does note some sleeping difficulty when taking to pills of the Cymbalta.  This has improved somewhat over the last several days.  She notes her memory has improved as well.  She cut salt out after our last visit.  Her blood pressure has been ranging between 106-135/71-84.  She denies chest pain or shortness of breath.  Social History   Tobacco Use  Smoking Status Never Smoker  Smokeless Tobacco Never Used     ROS see history of present illness  Objective  Physical Exam Vitals:   12/08/18 1124  BP: 122/86  Pulse: 88  Temp: 98.2 F (36.8 C)  SpO2: 97%    BP Readings from Last 3 Encounters:  12/08/18 122/86  10/26/18 (!) 160/90  10/19/18 132/72   Wt Readings from Last 3 Encounters:  12/08/18 177 lb (80.3 kg)  10/26/18 184 lb 6.4 oz (83.6 kg)  10/19/18 185 lb 12.8 oz (84.3 kg)    Physical Exam Constitutional:      General: She is not in acute distress.    Appearance: She is not diaphoretic.  Cardiovascular:     Rate and Rhythm: Normal rate and regular rhythm.     Heart sounds: Normal heart sounds.  Pulmonary:     Effort: Pulmonary effort is normal.     Breath sounds: Normal breath sounds.  Musculoskeletal:     Right lower leg: No edema.     Left lower leg: No edema.  Skin:    General: Skin is warm and dry.  Neurological:     Mental Status: She is alert.      Assessment/Plan: Please see individual problem list.  Hypertension Currently well controlled.  I encouraged her to continue to monitor her salt intake.  Depression, major, single episode, moderate ([facility]) Much  improved.  She will continue Cymbalta.  She will monitor the sleep issues and let us know if this does not continue to improve.  Memory difficulty This has improved.  I suspect this was related to her depression.    No orders of the defined types were placed in this encounter.   No orders of the defined types were placed in this encounter.    [name], MD [place]

## 2018-12-08 NOTE — Assessment & Plan Note (Signed)
Much improved.  She will continue Cymbalta.  She will monitor the sleep issues and let us know if this does not continue to improve.

## 2019-02-02 DIAGNOSIS — M47816 Spondylosis without myelopathy or radiculopathy, lumbar region: Secondary | ICD-10-CM | POA: Diagnosis not present

## 2019-02-11 ENCOUNTER — Other Ambulatory Visit: Payer: Self-pay | Admitting: Family Medicine

## 2019-02-11 DIAGNOSIS — I10 Essential (primary) hypertension: Secondary | ICD-10-CM

## 2019-02-11 DIAGNOSIS — R1013 Epigastric pain: Secondary | ICD-10-CM

## 2019-03-02 DIAGNOSIS — M47816 Spondylosis without myelopathy or radiculopathy, lumbar region: Secondary | ICD-10-CM | POA: Diagnosis not present

## 2019-04-07 DIAGNOSIS — L708 Other acne: Secondary | ICD-10-CM | POA: Diagnosis not present

## 2019-04-07 DIAGNOSIS — K121 Other forms of stomatitis: Secondary | ICD-10-CM | POA: Diagnosis not present

## 2019-04-09 ENCOUNTER — Ambulatory Visit (INDEPENDENT_AMBULATORY_CARE_PROVIDER_SITE_OTHER): Payer: Medicare HMO | Admitting: Family Medicine

## 2019-04-09 ENCOUNTER — Encounter: Payer: Self-pay | Admitting: Family Medicine

## 2019-04-09 ENCOUNTER — Other Ambulatory Visit: Payer: Self-pay

## 2019-04-09 DIAGNOSIS — G4733 Obstructive sleep apnea (adult) (pediatric): Secondary | ICD-10-CM

## 2019-04-09 DIAGNOSIS — I10 Essential (primary) hypertension: Secondary | ICD-10-CM

## 2019-04-09 DIAGNOSIS — E039 Hypothyroidism, unspecified: Secondary | ICD-10-CM

## 2019-04-09 DIAGNOSIS — R69 Illness, unspecified: Secondary | ICD-10-CM | POA: Diagnosis not present

## 2019-04-09 DIAGNOSIS — F321 Major depressive disorder, single episode, moderate: Secondary | ICD-10-CM

## 2019-04-09 MED ORDER — DULOXETINE HCL 30 MG PO CPEP: ORAL_CAPSULE | ORAL | 1 refills | Status: DC

## 2019-04-09 NOTE — Assessment & Plan Note (Signed)
Asymptomatic.  Continue Cymbalta.  New prescription sent to pharmacy.

## 2019-04-09 NOTE — Assessment & Plan Note (Signed)
I encouraged her to start using her CPAP again.  She is not able to tolerate this she will contact us so we can refer her to pulmonology for evaluation.

## 2019-04-09 NOTE — Assessment & Plan Note (Signed)
Continue Synthroid.  Check TSH. 

## 2019-04-09 NOTE — Assessment & Plan Note (Signed)
Adequately controlled.  Continue current medication.  She will come in for lab work.

## 2019-04-09 NOTE — Progress Notes (Signed)
Virtual Visit via video Note  This visit type was conducted due to national recommendations for restrictions regarding the COVID-19 pandemic (e.g. social distancing).  This format is felt to be most appropriate for this patient at this time.  All issues noted in this document were discussed and addressed.  No physical exam was performed (except for noted visual exam findings with Video Visits).   I connected with Cathy Coffey today at  1:15 PM EDT by a video enabled telemedicine application and verified that I am speaking with the correct person using two identifiers. Location patient: home Location provider: work Persons participating in the virtual visit: patient, provider, Janace Hoard (granddaughter)  I discussed the limitations, risks, security and privacy concerns of performing an evaluation and management service by telephone and the availability of in person appointments. I also discussed with the patient that there may be a patient responsible charge related to this service. The patient expressed understanding and agreed to proceed.   Reason for visit: follow-up  HPI: HYPERTENSION  Disease Monitoring  Home BP Monitoring 110s-120s/70s Chest pain- no    Dyspnea- no Medications  Compliance-  Taking amlodipine, lasix.  Edema- no  HYPOTHYROIDISM Disease Monitoring Skin Changes: no Heat/Cold intolerance: no Medication Monitoring Compliance:  Taking synthroid   Last TSH:   Lab Results  Component Value Date   TSH 2.70 10/26/2018   OSA:  CPAP: not using as she was having trouble tolerating the mask.  She has not used this in several months. Hypersomnia: did have issues though may have been related to cymbalta Well rested: yes  DEPRESSION: no symptoms Medication: cymbalta 30 mg, patient does report she had issues with sleepiness with trying to increase to the 60 mg dose.  She has remained on 30 mg and has had no issues. Anxiety: no SI: no Other symptoms: no   ROS: See  pertinent positives and negatives per HPI.  Past Medical History:  Diagnosis Date  . Asthma   . Diverticulosis   . Hypertension   . Hypothyroidism   . IBS (irritable bowel syndrome)   . Lichen planus   . Pancreatitis due to common bile duct stone 2000    Past Surgical History:  Procedure Laterality Date  . CHOLECYSTECTOMY    . COLONOSCOPY  2011  . HERNIA REPAIR    . MOUTH SURGERY  09/2016  . TUBAL LIGATION      Family History  Problem Relation Age of Onset  . Hypothyroidism Mother   . Hypertension Mother   . Dementia Father   . Dementia Paternal Grandmother     SOCIAL HX: Non-smoker.   Current Outpatient Medications:  .  alendronate (FOSAMAX) 70 MG tablet, TAKE 1 TABLET EVERY 7 DAYS . TAKE WITH A FULL GLASS OFWATER ON AN EMPTY STOMACH, Disp: 12 tablet, Rfl: 3 .  Alum & Mag Hydroxide-Simeth (ANTACID ANTI-GAS PO), Take by mouth., Disp: , Rfl:  .  amLODipine (NORVASC) 2.5 MG tablet, TAKE 1 TABLET DAILY, Disp: 90 tablet, Rfl: 3 .  Ascorbic Acid (VITAMIN C) 100 MG tablet, Take 100 mg by mouth daily., Disp: , Rfl:  .  B Complex-C (SUPER B COMPLEX PO), Take 1 capsule by mouth., Disp: , Rfl:  .  BIOTIN 5000 PO, Take by mouth., Disp: , Rfl:  .  Calcium Carbonate-Vitamin D (CALCIUM-VITAMIN D) 500-200 MG-UNIT per tablet, Take 3 tablets by mouth daily. , Disp: , Rfl:  .  desonide (DESOWEN) 0.05 % cream, Apply topically 2 (two) times daily., Disp: 30  g, Rfl: 0 .  DIGESTIVE AIDS MIXTURE PO, Take by mouth., Disp: , Rfl:  .  DULoxetine (CYMBALTA) 30 MG capsule, TAKE 1 CAPSULE BY MOUTH DAILY, Disp: 90 capsule, Rfl: 1 .  fluocinonide gel (LIDEX) 0.05 %, , Disp: , Rfl:  .  furosemide (LASIX) 20 MG tablet, TAKE 1 TABLET DAILY, Disp: 90 tablet, Rfl: 3 .  Glucosamine HCl 1000 MG TABS, Take 1 tablet by mouth., Disp: , Rfl:  .  levothyroxine (SYNTHROID, LEVOTHROID) 100 MCG tablet, TAKE 1 TABLET DAILY, Disp: 90 tablet, Rfl: 3 .  Magnesium 250 MG TABS, Take 500 mg by mouth. , Disp: , Rfl:  .   Misc Natural Products (ADVANCED JOINT RELIEF PO), Take by mouth., Disp: , Rfl:  .  Multiple Vitamins-Minerals (PRESERVISION AREDS PO), Take by mouth., Disp: , Rfl:  .  NIACIN, ANTIHYPERLIPIDEMIC, PO, Take 500 mg by mouth., Disp: , Rfl:  .  Omega-3 Fatty Acids (FISH OIL) 1000 MG CPDR, Take 3,000 mg by mouth daily. , Disp: , Rfl:  .  pantoprazole (PROTONIX) 40 MG tablet, Take 1 tablet (40 mg total) by mouth daily., Disp: 90 tablet, Rfl: 2 .  pantoprazole (PROTONIX) 40 MG tablet, TAKE 1 TABLET TWICE A DAY, Disp: 180 tablet, Rfl: 1 .  pantoprazole (PROTONIX) 40 MG tablet, TAKE 1 TABLET TWICE A DAY, Disp: 180 tablet, Rfl: 1 .  POTASSIUM PO, Take 600 mg by mouth., Disp: , Rfl:  .  Probiotic Product (TRUBIOTICS PO), Take 1 capsule by mouth., Disp: , Rfl:  .  vitamin A 10000 UNIT capsule, Take 10,000 Units by mouth daily., Disp: , Rfl:   EXAM:  VITALS per patient if applicable: None.  GENERAL: alert, oriented, appears well and in no acute distress  HEENT: atraumatic, conjunttiva clear, no obvious abnormalities on inspection of external nose and ears  NECK: normal movements of the head and neck  LUNGS: on inspection no signs of respiratory distress, breathing rate appears normal, no obvious gross SOB, gasping or wheezing  CV: no obvious cyanosis  MS: moves all visible extremities without noticeable abnormality  PSYCH/NEURO: pleasant and cooperative, no obvious depression or anxiety, speech and thought processing grossly intact  ASSESSMENT AND PLAN:  Discussed the following assessment and plan:  Essential hypertension - Plan: Comp Met (CMET), Lipid panel  OSA (obstructive sleep apnea)  Hypothyroidism, unspecified type - Plan: TSH  Depression, major, single episode, moderate (HCC)  Hypertension Adequately controlled.  Continue current medication.  She will come in for lab work.  OSA (obstructive sleep apnea) I encouraged her to start using her CPAP again.  She is not able to  tolerate this she will contact us so we can refer her to pulmonology for evaluation.  Hypothyroidism Continue Synthroid.  Check TSH.  Depression, major, single episode, moderate (HCC) Asymptomatic.  Continue Cymbalta.  New prescription sent to pharmacy.  [place] office will contact the patient to schedule follow-up and lab work.  Social distancing precautions and sick precautions given regarding COVID-19.   I discussed the assessment and treatment plan with the patient. The patient was provided an opportunity to ask questions and all were answered. The patient agreed with the plan and demonstrated an understanding of the instructions.   The patient was advised to call back or seek an in-person evaluation if the symptoms worsen or if the condition fails to improve as anticipated.   [name], MD

## 2019-04-28 DIAGNOSIS — L708 Other acne: Secondary | ICD-10-CM | POA: Diagnosis not present

## 2019-06-14 DIAGNOSIS — R69 Illness, unspecified: Secondary | ICD-10-CM | POA: Diagnosis not present

## 2019-06-16 DIAGNOSIS — H5203 Hypermetropia, bilateral: Secondary | ICD-10-CM | POA: Diagnosis not present

## 2019-06-16 DIAGNOSIS — H40023 Open angle with borderline findings, high risk, bilateral: Secondary | ICD-10-CM | POA: Diagnosis not present

## 2019-06-16 DIAGNOSIS — Z0100 Encounter for examination of eyes and vision without abnormal findings: Secondary | ICD-10-CM | POA: Diagnosis not present

## 2019-06-16 DIAGNOSIS — H35363 Drusen (degenerative) of macula, bilateral: Secondary | ICD-10-CM | POA: Diagnosis not present

## 2019-06-16 DIAGNOSIS — H52223 Regular astigmatism, bilateral: Secondary | ICD-10-CM | POA: Diagnosis not present

## 2019-06-16 DIAGNOSIS — H18832 Recurrent erosion of cornea, left eye: Secondary | ICD-10-CM | POA: Diagnosis not present

## 2019-06-17 ENCOUNTER — Other Ambulatory Visit: Payer: Self-pay

## 2019-06-22 ENCOUNTER — Encounter: Payer: Self-pay | Admitting: Family Medicine

## 2019-06-22 ENCOUNTER — Other Ambulatory Visit: Payer: Self-pay

## 2019-06-22 ENCOUNTER — Ambulatory Visit (INDEPENDENT_AMBULATORY_CARE_PROVIDER_SITE_OTHER): Payer: Medicare HMO | Admitting: Family Medicine

## 2019-06-22 VITALS — BP 120/80 | HR 87 | Temp 98.0°F | Ht 61.0 in | Wt 181.0 lb

## 2019-06-22 DIAGNOSIS — I10 Essential (primary) hypertension: Secondary | ICD-10-CM

## 2019-06-22 DIAGNOSIS — R413 Other amnesia: Secondary | ICD-10-CM

## 2019-06-22 DIAGNOSIS — E039 Hypothyroidism, unspecified: Secondary | ICD-10-CM | POA: Diagnosis not present

## 2019-06-22 DIAGNOSIS — F321 Major depressive disorder, single episode, moderate: Secondary | ICD-10-CM

## 2019-06-22 DIAGNOSIS — R69 Illness, unspecified: Secondary | ICD-10-CM | POA: Diagnosis not present

## 2019-06-22 DIAGNOSIS — M810 Age-related osteoporosis without current pathological fracture: Secondary | ICD-10-CM

## 2019-06-22 LAB — LDL CHOLESTEROL, DIRECT: Direct LDL: 130 mg/dL

## 2019-06-22 LAB — LIPID PANEL
Cholesterol: 209 mg/dL — ABNORMAL HIGH (ref 0–200)
HDL: 50.9 mg/dL (ref >39.00)
NonHDL: 157.68
Total CHOL/HDL Ratio: 4
Triglycerides: 232 mg/dL — ABNORMAL HIGH (ref 0.0–149.0)
VLDL: 46.4 mg/dL — ABNORMAL HIGH (ref 0.0–40.0)

## 2019-06-22 LAB — COMPREHENSIVE METABOLIC PANEL
ALT: 17 U/L (ref 0–35)
AST: 19 U/L (ref 0–37)
Albumin: 4.3 g/dL (ref 3.5–5.2)
Alkaline Phosphatase: 88 U/L (ref 39–117)
BUN: 19 mg/dL (ref 6–23)
CO2: 26 mEq/L (ref 19–32)
Calcium: 9.1 mg/dL (ref 8.4–10.5)
Chloride: 102 mEq/L (ref 96–112)
Creatinine, Ser: 0.87 mg/dL (ref 0.40–1.20)
GFR: 63.31 mL/min (ref >60.00)
Glucose, Bld: 105 mg/dL — ABNORMAL HIGH (ref 70–99)
Potassium: 3.8 mEq/L (ref 3.5–5.1)
Sodium: 139 mEq/L (ref 135–145)
Total Bilirubin: 0.4 mg/dL (ref 0.2–1.2)
Total Protein: 6.8 g/dL (ref 6.0–8.3)

## 2019-06-22 LAB — VITAMIN D 25 HYDROXY (VIT D DEFICIENCY, FRACTURES): VITD: 31.26 ng/mL (ref 30.00–100.00)

## 2019-06-22 LAB — TSH: TSH: 1.58 u[IU]/mL (ref 0.35–4.50)

## 2019-06-22 NOTE — Assessment & Plan Note (Signed)
Continue Synthroid.  Check TSH. 

## 2019-06-22 NOTE — Progress Notes (Signed)
[name], MD Phone: [phone]  Cathy Coffey is a 78 y.o. female who presents today for follow-up.  Hypertension: Not checking blood pressures.  Taking amlodipine and Lasix.  No chest pain, shortness of breath, or edema.  Hypothyroidism: Taking Synthroid.  No skin changes.  No heat or cold intolerance.  Osteoporosis: She stopped Fosamax 3 to 4 months ago due to difficulty remembering taking it.  She is on calcium 1200 mg daily and vitamin D at least 1000 international units daily.  No fractures.  Depression: She denies any depressive symptoms.  She wants to come off of Cymbalta as it makes her head feel swimmy about 2 to 3 hours after taking it.  She has pushed the dose back progressively recently and that has helped some.  She also notes her memory has not been all that good after taking the Cymbalta.  Social History   Tobacco Use  Smoking Status Never Smoker  Smokeless Tobacco Never Used     ROS see history of present illness  Objective  Physical Exam Vitals:   06/22/19 1129  BP: 120/80  Pulse: 87  Temp: 98 F (36.7 C)  SpO2: 98%    BP Readings from Last 3 Encounters:  06/22/19 120/80  12/08/18 122/86  10/26/18 (!) 160/90   Wt Readings from Last 3 Encounters:  06/22/19 181 lb (82.1 kg)  12/08/18 177 lb (80.3 kg)  10/26/18 184 lb 6.4 oz (83.6 kg)    Physical Exam Constitutional:      General: She is not in acute distress.    Appearance: She is not diaphoretic.  Cardiovascular:     Rate and Rhythm: Normal rate and regular rhythm.     Heart sounds: Normal heart sounds.  Pulmonary:     Effort: Pulmonary effort is normal.     Breath sounds: Normal breath sounds.  Musculoskeletal:     Right lower leg: No edema.     Left lower leg: No edema.  Skin:    General: Skin is warm and dry.  Neurological:     Mental Status: She is alert.     Comments: CN 3-12 intact, 5/5 strength in bilateral biceps, triceps, grip, quads, hamstrings, plantar and  dorsiflexion, sensation to light touch intact in bilateral UE and LE, normal gait      Assessment/Plan: Please see individual problem list.  Hypertension Well-controlled.  Continue current regimen.  Lab work today.  Hypothyroidism Continue Synthroid.  Check TSH.  Osteoporosis Check vitamin D.  She will restart the Fosamax.  We will defer her bone density test until she has been back on Fosamax.  Will discuss getting this scheduled when she follows up in December.  Depression, major, single episode, moderate (HCC) Much improved.  She will discontinue the Cymbalta.  She will monitor for recurrence of symptoms.  She will monitor for resolution of her swimmy headedness as well.  Memory difficulty Potentially worsened by the Cymbalta.  She will discontinue this and we will recheck in about a month.   Orders Placed This Encounter  Procedures  . DG Bone Density    Standing Status:   Future    Standing Expiration Date:   08/21/2020    Order Specific Question:   Reason for Exam (SYMPTOM  OR DIAGNOSIS REQUIRED)    Answer:   osteoporosis follow-up    Order Specific Question:   Preferred imaging location?    Answer:   [facility]  . Lipid panel  . Comp Met (CMET)  . TSH  .  Vitamin D (25 hydroxy)    No orders of the defined types were placed in this encounter.    [name], MD [place]

## 2019-06-22 NOTE — Patient Instructions (Signed)
Nice to see you. Please discontinue your duloxetine.  Please monitor your swimmy headedness and memory with this.  We will see you back in 4 to 6 weeks to follow-up on this. Please resume your Fosamax.  You are due for a bone density scan.  Please call to get this scheduled. We will check lab work today and contact you with the results.

## 2019-06-22 NOTE — Assessment & Plan Note (Signed)
Well-controlled.  Continue current regimen.  Lab work today.

## 2019-06-22 NOTE — Addendum Note (Signed)
Addended by: Caryl Bis, Tanara Turvey G on: 06/22/2019 12:04 PM   Modules accepted: Orders

## 2019-06-22 NOTE — Assessment & Plan Note (Signed)
Much improved.  She will discontinue the Cymbalta.  She will monitor for recurrence of symptoms.  She will monitor for resolution of her swimmy headedness as well.

## 2019-06-22 NOTE — Assessment & Plan Note (Signed)
Check vitamin D.  She will restart the Fosamax.  We will defer her bone density test until she has been back on Fosamax.  Will discuss getting this scheduled when she follows up in December.

## 2019-06-22 NOTE — Assessment & Plan Note (Signed)
Potentially worsened by the Cymbalta.  She will discontinue this and we will recheck in about a month.

## 2019-07-05 ENCOUNTER — Encounter: Payer: Self-pay | Admitting: Family Medicine

## 2019-07-08 DIAGNOSIS — M5416 Radiculopathy, lumbar region: Secondary | ICD-10-CM | POA: Diagnosis not present

## 2019-07-08 DIAGNOSIS — M62830 Muscle spasm of back: Secondary | ICD-10-CM | POA: Diagnosis not present

## 2019-07-08 DIAGNOSIS — M9902 Segmental and somatic dysfunction of thoracic region: Secondary | ICD-10-CM | POA: Diagnosis not present

## 2019-07-08 DIAGNOSIS — M9903 Segmental and somatic dysfunction of lumbar region: Secondary | ICD-10-CM | POA: Diagnosis not present

## 2019-07-20 ENCOUNTER — Other Ambulatory Visit: Payer: Self-pay

## 2019-07-20 ENCOUNTER — Ambulatory Visit
Admission: RE | Admit: 2019-07-20 | Discharge: 2019-07-20 | Disposition: A | Discharge status: Home / Self Care | Payer: Medicare HMO | Source: Ambulatory Visit | Attending: Family Medicine | Admitting: Family Medicine

## 2019-07-20 DIAGNOSIS — M8589 Other specified disorders of bone density and structure, multiple sites: Secondary | ICD-10-CM | POA: Diagnosis not present

## 2019-07-20 DIAGNOSIS — M810 Age-related osteoporosis without current pathological fracture: Secondary | ICD-10-CM | POA: Insufficient documentation

## 2019-07-20 DIAGNOSIS — Z780 Asymptomatic menopausal state: Secondary | ICD-10-CM | POA: Diagnosis not present

## 2019-07-21 ENCOUNTER — Ambulatory Visit (INDEPENDENT_AMBULATORY_CARE_PROVIDER_SITE_OTHER): Payer: Medicare HMO | Admitting: Family Medicine

## 2019-07-21 ENCOUNTER — Encounter: Payer: Self-pay | Admitting: Family Medicine

## 2019-07-21 ENCOUNTER — Other Ambulatory Visit: Payer: Self-pay

## 2019-07-21 VITALS — BP 110/70 | HR 79 | Temp 97.5°F | Ht 61.5 in | Wt 182.0 lb

## 2019-07-21 DIAGNOSIS — R413 Other amnesia: Secondary | ICD-10-CM

## 2019-07-21 DIAGNOSIS — M810 Age-related osteoporosis without current pathological fracture: Secondary | ICD-10-CM | POA: Diagnosis not present

## 2019-07-21 DIAGNOSIS — Z23 Encounter for immunization: Secondary | ICD-10-CM

## 2019-07-21 DIAGNOSIS — R69 Illness, unspecified: Secondary | ICD-10-CM | POA: Diagnosis not present

## 2019-07-21 DIAGNOSIS — I10 Essential (primary) hypertension: Secondary | ICD-10-CM | POA: Diagnosis not present

## 2019-07-21 DIAGNOSIS — F325 Major depressive disorder, single episode, in full remission: Secondary | ICD-10-CM

## 2019-07-21 NOTE — Progress Notes (Signed)
[name], MD Phone: [phone]  Cathy Coffey is a 78 y.o. female who presents today for follow-up.  Memory difficulty: This continues to be an issue.  It is her short-term memory.  She has trouble remembering things that are told to her recently.  Long-term memory is okay.  4/5 on mini cog testing today.  Swimmy headedness: This resolved after several weeks after coming off of the Cymbalta.  She had some diarrhea and dizziness coming off the Cymbalta though that has resolved.  Depression: She denies depressive symptoms.  Her outlook is good.  She is currently off of Cymbalta.  Osteoporosis: Taking Fosamax.  She has done okay with this.  No stomach irritation.  Hypertension: Notes on one occasion was 173/103 several days ago.  She had been dizzy and felt nauseous and then checked her blood pressure noted it was that high.  She took an extra blood pressure pill and noted it came down.  No chest pain, shortness of breath, word slurring, numbness, or weakness.  She has not had any recurrence of those symptoms.  Blood pressure has been adequately controlled since then.  She notes her blood pressure went up the day after she ate a ham biscuit.  She notes she is taking CBD oil for her back.  Social History   Tobacco Use  Smoking Status Never Smoker  Smokeless Tobacco Never Used     ROS see history of present illness  Objective  Physical Exam Vitals:   07/21/19 1357  BP: 110/70  Pulse: 79  Temp: (!) 97.5 F (36.4 C)  SpO2: 98%    BP Readings from Last 3 Encounters:  07/21/19 110/70  06/22/19 120/80  12/08/18 122/86   Wt Readings from Last 3 Encounters:  07/21/19 182 lb (82.6 kg)  06/22/19 181 lb (82.1 kg)  12/08/18 177 lb (80.3 kg)    Physical Exam Constitutional:      General: She is not in acute distress.    Appearance: She is not diaphoretic.  HENT:     Head: Normocephalic and atraumatic.     Right Ear: Tympanic membrane normal.     Left Ear: Tympanic  membrane normal.  Cardiovascular:     Rate and Rhythm: Normal rate and regular rhythm.     Heart sounds: Normal heart sounds.  Pulmonary:     Effort: Pulmonary effort is normal.     Breath sounds: Normal breath sounds.  Skin:    General: Skin is warm and dry.  Neurological:     Mental Status: She is alert.     Comments: CN 3-12 intact, 5/5 strength in bilateral biceps, triceps, grip, quads, hamstrings, plantar and dorsiflexion, sensation to light touch intact in bilateral UE and LE, normal gait      Assessment/Plan: Please see individual problem list.  Hypertension Generally has been well controlled though did have one episode where it was elevated and she was possibly symptomatic from this.  That has not recurred.  Discussed avoiding salt as that could have played a role.  She will continue with her current regimen.  She will monitor for any recurrence and seek evaluation if she has recurrent symptoms.  Osteoporosis Continue Fosamax.  Depression Currently asymptomatic off of medication.  She will monitor for recurrence.  Swimmy headedness has improved.  She will monitor for recurrence.  Memory difficulty We will refer to neurology for evaluation.  Counseled against using CBD oil given that it is not regulated and there could be other substances in  this.  Also discussed that there could be medication interactions that we are unaware of as this supplement has not been tested.  Orders Placed This Encounter  Procedures  . Flu Vaccine QUAD High Dose(Fluad)  . Ambulatory referral to Neurology    Referral Priority:   Routine    Referral Type:   Consultation    Referral Reason:   Specialty Services Required    Requested Specialty:   Neurology    Number of Visits Requested:   1    No orders of the defined types were placed in this encounter.    [name], MD [place]

## 2019-07-21 NOTE — Patient Instructions (Signed)
Nice to see you. Please continue to work on diet and exercise. Please monitor your blood pressure daily for the next week and send [name] your readings. We will get you see neurology for your memory.

## 2019-07-22 DIAGNOSIS — M47816 Spondylosis without myelopathy or radiculopathy, lumbar region: Secondary | ICD-10-CM | POA: Diagnosis not present

## 2019-07-22 NOTE — Assessment & Plan Note (Addendum)
Currently asymptomatic off of medication.  She will monitor for recurrence.  Swimmy headedness has improved.  She will monitor for recurrence.

## 2019-07-22 NOTE — Assessment & Plan Note (Signed)
We will refer to neurology for evaluation.

## 2019-07-22 NOTE — Assessment & Plan Note (Signed)
Generally has been well controlled though did have one episode where it was elevated and she was possibly symptomatic from this.  That has not recurred.  Discussed avoiding salt as that could have played a role.  She will continue with her current regimen.  She will monitor for any recurrence and seek evaluation if she has recurrent symptoms.

## 2019-07-22 NOTE — Assessment & Plan Note (Signed)
Continue Fosamax  

## 2019-07-29 ENCOUNTER — Telehealth: Payer: Self-pay

## 2019-07-29 NOTE — Telephone Encounter (Signed)
Copied from [place] [id number]. Topic: General - Inquiry >> Jul 29, 2019 11:34 AM Scherrie Gerlach wrote: Reason for CRM: pt was to report her bp for a week readings around 11 am each day 9/10   119/82 9/11   103/66 9/12   110/68 9/13   107/73 9/14   120/76 9/15     99/79 9/16    106/70 9/17    103/69

## 2019-07-30 NOTE — Telephone Encounter (Signed)
Blood pressures are very well controlled.  She should continue with her current regimen and periodically monitor her blood pressures.

## 2019-08-02 NOTE — Telephone Encounter (Signed)
Called and spoke with the patient and informed her that the provider did receive her BP readings and she is to keep her current regimen and to monitor the BP periodically.  Pt understood.  Maila Dukes,cma

## 2019-08-13 DIAGNOSIS — M47816 Spondylosis without myelopathy or radiculopathy, lumbar region: Secondary | ICD-10-CM | POA: Diagnosis not present

## 2019-10-13 DIAGNOSIS — M47816 Spondylosis without myelopathy or radiculopathy, lumbar region: Secondary | ICD-10-CM | POA: Diagnosis not present

## 2019-10-22 ENCOUNTER — Ambulatory Visit (INDEPENDENT_AMBULATORY_CARE_PROVIDER_SITE_OTHER): Payer: Medicare HMO

## 2019-10-22 ENCOUNTER — Other Ambulatory Visit: Payer: Self-pay

## 2019-10-22 ENCOUNTER — Encounter: Payer: Self-pay | Admitting: Family Medicine

## 2019-10-22 ENCOUNTER — Ambulatory Visit (INDEPENDENT_AMBULATORY_CARE_PROVIDER_SITE_OTHER): Payer: Medicare HMO | Admitting: Family Medicine

## 2019-10-22 DIAGNOSIS — I10 Essential (primary) hypertension: Secondary | ICD-10-CM | POA: Diagnosis not present

## 2019-10-22 DIAGNOSIS — R1013 Epigastric pain: Secondary | ICD-10-CM

## 2019-10-22 DIAGNOSIS — G8929 Other chronic pain: Secondary | ICD-10-CM

## 2019-10-22 DIAGNOSIS — M810 Age-related osteoporosis without current pathological fracture: Secondary | ICD-10-CM

## 2019-10-22 DIAGNOSIS — M545 Low back pain, unspecified: Secondary | ICD-10-CM

## 2019-10-22 DIAGNOSIS — E039 Hypothyroidism, unspecified: Secondary | ICD-10-CM | POA: Diagnosis not present

## 2019-10-22 DIAGNOSIS — R413 Other amnesia: Secondary | ICD-10-CM

## 2019-10-22 DIAGNOSIS — Z0000 Encounter for general adult medical examination without abnormal findings: Secondary | ICD-10-CM | POA: Diagnosis not present

## 2019-10-22 MED ORDER — FUROSEMIDE 20 MG PO TABS: 20.0000 mg | ORAL_TABLET | Freq: Every day | ORAL | 3 refills | Status: DC

## 2019-10-22 MED ORDER — AMLODIPINE BESYLATE 2.5 MG PO TABS: 2.5000 mg | ORAL_TABLET | Freq: Every day | ORAL | 3 refills | Status: DC

## 2019-10-22 MED ORDER — ALENDRONATE SODIUM 70 MG PO TABS: ORAL_TABLET | ORAL | 3 refills | Status: DC

## 2019-10-22 MED ORDER — LEVOTHYROXINE SODIUM 100 MCG PO TABS: 100.0000 ug | ORAL_TABLET | Freq: Every day | ORAL | 3 refills | Status: DC

## 2019-10-22 NOTE — Assessment & Plan Note (Signed)
Prior TSH adequate.  She will continue Synthroid.

## 2019-10-22 NOTE — Patient Instructions (Addendum)
  Ms. Huisman , Thank you for taking time to come for your Medicare Wellness Visit. I appreciate your ongoing commitment to your health goals. Please review the following plan we discussed and let me know if I can assist you in the future.   These are the goals we discussed: Goals      Patient Stated   . I want to lower my cholesterol (pt-stated)     Increase activity as tolerated Healthy diet       This is a list of the screening recommended for you and due dates:  Health Maintenance  Topic Date Due  . Tetanus Vaccine  06/30/1960  . Flu Shot  Completed  . DEXA scan (bone density measurement)  Completed  . Pneumonia vaccines  Completed

## 2019-10-22 NOTE — Assessment & Plan Note (Signed)
She will keep her appointment with neurology.

## 2019-10-22 NOTE — Assessment & Plan Note (Signed)
Continue Fosamax, calcium, and vitamin D.

## 2019-10-22 NOTE — Progress Notes (Signed)
Subjective:   Cathy Coffey is a 77 y.o. female who presents for Medicare Annual (Subsequent) preventive examination.  Review of Systems:  No ROS.  Medicare Wellness Virtual Visit.  Visual/audio telehealth visit, UTA vital signs.   See social history for additional risk factors.   Cardiac Risk Factors include: advanced age (>71men, >20 women);hypertension     Objective:     Vitals: There were no vitals taken for this visit.  There is no height or weight on file to calculate BMI.  Advanced Directives 10/22/2019 10/19/2018 10/16/2017 02/19/2017 02/05/2017 10/16/2016  Does Patient Have a Medical Advance Directive? No No No No No No  Would patient like information on creating a medical advance directive? No - Patient declined Yes (MAU/Ambulatory/Procedural Areas - Information given) No - Patient declined - - Yes (MAU/Ambulatory/Procedural Areas - Information given)    Tobacco Social History   Tobacco Use  Smoking Status Never Smoker  Smokeless Tobacco Never Used     Counseling given: Not Answered   Clinical Intake:  Pre-visit preparation completed: Yes           How often do you need to have someone help you when you read instructions, pamphlets, or other written materials from your doctor or pharmacy?: 1 - Never  Interpreter Needed?: No     Past Medical History:  Diagnosis Date  . Asthma   . Diverticulosis   . Hypertension   . Hypothyroidism   . IBS (irritable bowel syndrome)   . Lichen planus   . Pancreatitis due to common bile duct stone 2000   Past Surgical History:  Procedure Laterality Date  . CHOLECYSTECTOMY    . COLONOSCOPY  2011  . HERNIA REPAIR    . MOUTH SURGERY  09/2016  . TUBAL LIGATION     Family History  Problem Relation Age of Onset  . Hypothyroidism Mother   . Hypertension Mother   . Dementia Father   . Dementia Paternal Grandmother    Social History   Socioeconomic History  . Marital status: Divorced    Spouse name: Not on  file  . Number of children: Not on file  . Years of education: Not on file  . Highest education level: Not on file  Occupational History  . Not on file  Tobacco Use  . Smoking status: Never Smoker  . Smokeless tobacco: Never Used  Substance and Sexual Activity  . Alcohol use: No  . Drug use: No  . Sexual activity: Never  Other Topics Concern  . Not on file  Social History Narrative  . Not on file   Social Determinants of Health   Financial Resource Strain:   . Difficulty of Paying Living Expenses: Not on file  Food Insecurity:   . Worried About Charity fundraiser in the Last Year: Not on file  . Ran Out of Food in the Last Year: Not on file  Transportation Needs:   . Lack of Transportation (Medical): Not on file  . Lack of Transportation (Non-Medical): Not on file  Physical Activity:   . Days of Exercise per Week: Not on file  . Minutes of Exercise per Session: Not on file  Stress:   . Feeling of Stress : Not on file  Social Connections:   . Frequency of Communication with Friends and Family: Not on file  . Frequency of Social Gatherings with Friends and Family: Not on file  . Attends Religious Services: Not on file  . Active Member of  Clubs or Organizations: Not on file  . Attends Archivist Meetings: Not on file  . Marital Status: Not on file    Outpatient Encounter Medications as of 10/22/2019  Medication Sig  . alendronate (FOSAMAX) 70 MG tablet TAKE 1 TABLET EVERY 7 DAYS . TAKE WITH A FULL GLASS OFWATER ON AN EMPTY STOMACH  . Alum & Mag Hydroxide-Simeth (ANTACID ANTI-GAS PO) Take by mouth.  Marland Kitchen amLODipine (NORVASC) 2.5 MG tablet Take 1 tablet (2.5 mg total) by mouth daily.  . Ascorbic Acid (VITAMIN C) 100 MG tablet Take 100 mg by mouth daily.  . B Complex-C (SUPER B COMPLEX PO) Take 1 capsule by mouth.  Marland Kitchen BIOTIN 5000 PO Take by mouth.  . Calcium Carbonate-Vitamin D (CALCIUM-VITAMIN D) 500-200 MG-UNIT per tablet Take 3 tablets by mouth daily.   Marland Kitchen  desonide (DESOWEN) 0.05 % cream Apply topically 2 (two) times daily.  Marland Kitchen DIGESTIVE AIDS MIXTURE PO Take by mouth.  . fluocinonide gel (LIDEX) 0.05 %   . furosemide (LASIX) 20 MG tablet Take 1 tablet (20 mg total) by mouth daily.  . Glucosamine HCl 1000 MG TABS Take 1 tablet by mouth.  . levothyroxine (SYNTHROID) 100 MCG tablet Take 1 tablet (100 mcg total) by mouth daily.  . Magnesium 250 MG TABS Take 500 mg by mouth.   . Misc Natural Products (ADVANCED JOINT RELIEF PO) Take by mouth.  . Multiple Vitamins-Minerals (PRESERVISION AREDS PO) Take by mouth.  Marland Kitchen NIACIN, ANTIHYPERLIPIDEMIC, PO Take 500 mg by mouth.  . Omega-3 Fatty Acids (FISH OIL) 1000 MG CPDR Take 3,000 mg by mouth daily.   . pantoprazole (PROTONIX) 40 MG tablet Take 1 tablet (40 mg total) by mouth daily.  . pantoprazole (PROTONIX) 40 MG tablet TAKE 1 TABLET TWICE A DAY  . pantoprazole (PROTONIX) 40 MG tablet TAKE 1 TABLET TWICE A DAY  . POTASSIUM PO Take 600 mg by mouth.  . Probiotic Product (TRUBIOTICS PO) Take 1 capsule by mouth.  . vitamin A 10000 UNIT capsule Take 10,000 Units by mouth daily.   No facility-administered encounter medications on file as of 10/22/2019.    Activities of Daily Living In your present state of health, do you have any difficulty performing the following activities: 10/22/2019  Hearing? N  Vision? N  Difficulty concentrating or making decisions? N  Walking or climbing stairs? N  Dressing or bathing? N  Doing errands, shopping? N  Preparing Food and eating ? N  Using the Toilet? N  In the past six months, have you accidently leaked urine? N  Do you have problems with loss of bowel control? N  Managing your Medications? N  Managing your Finances? N  Housekeeping or managing your Housekeeping? N  Some recent data might be hidden    Patient Care Team: Leone Haven, MD as PCP - General (Family Medicine)    Assessment:   This is a routine wellness examination for Cathy Coffey.  Nurse  connected with patient 10/22/19 at 12:30 PM EST by a telephone enabled telemedicine application and verified that I am speaking with the correct person using two identifiers. Patient stated full name and DOB. Patient gave permission to continue with virtual visit. Patient's location was at home and Nurse's location was at [place] office.   Health Maintenance Due: -Tdap- discussed; to be completed with doctor in visit or local pharmacy.   Update all pending maintenance due as appropriate.   See completed HM at the end of note.   Eye: Visual  acuity not assessed. Virtual visit.   Dental: UTD  Hearing: Demonstrates normal hearing during visit.  Safety:  Patient feels safe at home- yes Patient does have smoke detectors at home- yes Patient does wear sunscreen or protective clothing when in direct sunlight - yes Patient does wear seat belt when in a moving vehicle - yes Patient drives- yes Adequate lighting in walkways free from debris- yes Grab bars and handrails used as appropriate- yes Ambulates with no assistive device Cell phone on person when ambulating outside of the home- yes  Social: Alcohol intake - no       Smoking history- never   Smokers in home? none Illicit drug use? none  Depression: PHQ 2 &9 complete. See screening below.   Falls: See screening below.    Medication: Taking as directed and without issues. Pantoprazole 40mg  refill sent to mail order pharmacy on file.   Covid-19: Precautions and sickness symptoms discussed. Wears mask, social distancing, hand hygiene as appropriate.   Activities of Daily Living Patient denies needing assistance with: household chores, feeding themselves, getting from bed to chair, getting to the toilet, bathing/showering, dressing, managing money, or preparing meals.   Memory: Patient is alert. MMSE/6CIT declined. Appointment with Neurology in January 2021.  BMI- discussed the importance of a healthy diet, water intake and  the benefits of aerobic exercise.   Physical activity- active at home as tolerated. Paces herself.   Diet:  Regular Water: good intake   Other Providers Patient Care Team: Leone Haven, MD as PCP - General (Family Medicine) Exercise Activities and Dietary recommendations Current Exercise Habits: The patient does not participate in regular exercise at present  Goals      Patient Stated   . I want to lower my cholesterol (pt-stated)     Increase activity as tolerated Healthy diet       Fall Risk Fall Risk  10/22/2019 10/22/2019 07/21/2019 12/08/2018 10/26/2018  Falls in the past year? 0 0 1 0 0  Number falls in past yr: - 0 0 0 0  Injury with Fall? - - 0 0 0  Follow up Falls prevention discussed Falls evaluation completed Falls evaluation completed - -   Timed Get Up and Go performed: no, virtual visit.  Depression Screen PHQ 2/9 Scores 10/22/2019 07/21/2019 12/08/2018 12/08/2018  PHQ - 2 Score 0 0 1 1  PHQ- 9 Score - - 3 -  Exception Documentation - - - -     Cognitive Function MMSE - Mini Mental State Exam 10/19/2018 10/16/2016  Orientation to time 5 5  Orientation to Place 5 5  Registration 3 3  Attention/ Calculation 4 5  Recall 3 3  Language- name 2 objects 2 2  Language- repeat 1 1  Language- follow 3 step command 3 3  Language- read & follow direction 1 1  Write a sentence 1 1  Copy design 1 1  Total score 29 30     6CIT Screen 10/16/2017  What Year? 0 points  What month? 0 points  What time? 0 points  Count back from 20 0 points  Months in reverse 0 points  Repeat phrase 0 points  Total Score 0    Immunization History  Administered Date(s) Administered  . Fluad Quad(high Dose 65+) 07/21/2019  . Influenza Split 10/01/2012  . Influenza, High Dose Seasonal PF 08/21/2016, 09/02/2017, 10/19/2018  . Influenza,inj,Quad PF,6+ Mos 07/27/2013, 11/02/2014, 11/09/2015  . Pneumococcal Conjugate-13 11/02/2014  . Pneumococcal Polysaccharide-23 10/01/2012  Screening Tests Health Maintenance  Topic Date Due  . TETANUS/TDAP  06/[birth year]/1962  . INFLUENZA VACCINE  Completed  . DEXA SCAN  Completed  . PNA vac Low Risk Adult  Completed      Plan:   Keep all routine maintenance appointments.   Patient has ongoing concerns regarding her cholesterol levels previously discussed. Follow up with lipid panel and/or start low dose cholesterol medication per your doctor's recommendation.  Medicare Attestation I have personally reviewed: The patient's medical and social history Their use of alcohol, tobacco or illicit drugs Their current medications and supplements The patient's functional ability including ADLs,fall risks, home safety risks, cognitive, and hearing and visual impairment Diet and physical activities Evidence for depression   I have reviewed and discussed with patient certain preventive protocols, quality metrics, and best practice recommendations.    Varney Biles, LPN  [id number]

## 2019-10-22 NOTE — Assessment & Plan Note (Signed)
She will continue to see physical medicine and rehab.

## 2019-10-22 NOTE — Assessment & Plan Note (Signed)
Adequately controlled.  Continue current regimen. 

## 2019-10-22 NOTE — Progress Notes (Signed)
Virtual Visit via telephone Note  This visit type was conducted due to national recommendations for restrictions regarding the COVID-19 pandemic (e.g. social distancing).  This format is felt to be most appropriate for this patient at this time.  All issues noted in this document were discussed and addressed.  No physical exam was performed (except for noted visual exam findings with Video Visits).   I connected with Cathy Coffey today at  9:30 AM EST by telephone and verified that I am speaking with the correct person using two identifiers. Location patient: home Location provider: home office Persons participating in the virtual visit: patient, provider  I discussed the limitations, risks, security and privacy concerns of performing an evaluation and management service by telephone and the availability of in person appointments. I also discussed with the patient that there may be a patient responsible charge related to this service. The patient expressed understanding and agreed to proceed.  Interactive audio and video telecommunications were attempted between this provider and patient, however failed, due to patient having technical difficulties OR patient did not have access to video capability.  We continued and completed visit with audio only.   Reason for visit: Follow-up  HPI: Hypertension: Typically 130s over 70s.  Taking amlodipine.  No chest pain shortness of breath.  Hypothyroidism: Taking Synthroid.  No skin changes.  No heat or cold intolerance.  Osteoporosis: Taking Fosamax, vitamin D, and calcium.  No recent fractures.  No trouble swallowing or staying upright with the Fosamax.  Memory difficulties: She does have an appointment with neurology in January.  No depression.  Back pain: This is a chronic issue.  She is following the physical medicine and rehab.  She had an injection 2 weeks ago which was quite beneficial.  She has another injection on January 14.  If that one  works they are going to burn the nerve.  Taking tramadol and ibuprofen 2-3 times a day with decent benefit.   ROS: See pertinent positives and negatives per HPI.  Past Medical History:  Diagnosis Date  . Asthma   . Diverticulosis   . Hypertension   . Hypothyroidism   . IBS (irritable bowel syndrome)   . Lichen planus   . Pancreatitis due to common bile duct stone 2000    Past Surgical History:  Procedure Laterality Date  . CHOLECYSTECTOMY    . COLONOSCOPY  2011  . HERNIA REPAIR    . MOUTH SURGERY  09/2016  . TUBAL LIGATION      Family History  Problem Relation Age of Onset  . Hypothyroidism Mother   . Hypertension Mother   . Dementia Father   . Dementia Paternal Grandmother     SOCIAL HX: Non-smoker   Current Outpatient Medications:  .  alendronate (FOSAMAX) 70 MG tablet, TAKE 1 TABLET EVERY 7 DAYS . TAKE WITH A FULL GLASS OFWATER ON AN EMPTY STOMACH, Disp: 12 tablet, Rfl: 3 .  Alum & Mag Hydroxide-Simeth (ANTACID ANTI-GAS PO), Take by mouth., Disp: , Rfl:  .  amLODipine (NORVASC) 2.5 MG tablet, Take 1 tablet (2.5 mg total) by mouth daily., Disp: 90 tablet, Rfl: 3 .  Ascorbic Acid (VITAMIN C) 100 MG tablet, Take 100 mg by mouth daily., Disp: , Rfl:  .  B Complex-C (SUPER B COMPLEX PO), Take 1 capsule by mouth., Disp: , Rfl:  .  BIOTIN 5000 PO, Take by mouth., Disp: , Rfl:  .  Calcium Carbonate-Vitamin D (CALCIUM-VITAMIN D) 500-200 MG-UNIT per tablet, Take 3  tablets by mouth daily. , Disp: , Rfl:  .  desonide (DESOWEN) 0.05 % cream, Apply topically 2 (two) times daily., Disp: 30 g, Rfl: 0 .  DIGESTIVE AIDS MIXTURE PO, Take by mouth., Disp: , Rfl:  .  fluocinonide gel (LIDEX) 0.05 %, , Disp: , Rfl:  .  furosemide (LASIX) 20 MG tablet, Take 1 tablet (20 mg total) by mouth daily., Disp: 90 tablet, Rfl: 3 .  Glucosamine HCl 1000 MG TABS, Take 1 tablet by mouth., Disp: , Rfl:  .  levothyroxine (SYNTHROID) 100 MCG tablet, Take 1 tablet (100 mcg total) by mouth daily., Disp:  90 tablet, Rfl: 3 .  Magnesium 250 MG TABS, Take 500 mg by mouth. , Disp: , Rfl:  .  Misc Natural Products (ADVANCED JOINT RELIEF PO), Take by mouth., Disp: , Rfl:  .  Multiple Vitamins-Minerals (PRESERVISION AREDS PO), Take by mouth., Disp: , Rfl:  .  NIACIN, ANTIHYPERLIPIDEMIC, PO, Take 500 mg by mouth., Disp: , Rfl:  .  Omega-3 Fatty Acids (FISH OIL) 1000 MG CPDR, Take 3,000 mg by mouth daily. , Disp: , Rfl:  .  pantoprazole (PROTONIX) 40 MG tablet, Take 1 tablet (40 mg total) by mouth daily., Disp: 90 tablet, Rfl: 2 .  pantoprazole (PROTONIX) 40 MG tablet, TAKE 1 TABLET TWICE A DAY, Disp: 180 tablet, Rfl: 1 .  pantoprazole (PROTONIX) 40 MG tablet, TAKE 1 TABLET TWICE A DAY, Disp: 180 tablet, Rfl: 1 .  POTASSIUM PO, Take 600 mg by mouth., Disp: , Rfl:  .  Probiotic Product (TRUBIOTICS PO), Take 1 capsule by mouth., Disp: , Rfl:  .  vitamin A 10000 UNIT capsule, Take 10,000 Units by mouth daily., Disp: , Rfl:   EXAM: This is a telehealth telephone visit and thus no physical exam was completed.  ASSESSMENT AND PLAN:  Discussed the following assessment and plan:  Hypertension Adequately controlled.  Continue current regimen.  Hypothyroidism Prior TSH adequate.  She will continue Synthroid.  Osteoporosis Continue Fosamax, calcium, and vitamin D.  Lumbago She will continue to see physical medicine and rehab.  Memory difficulty She will keep her appointment with neurology.    I discussed the assessment and treatment plan with the patient. The patient was provided an opportunity to ask questions and all were answered. The patient agreed with the plan and demonstrated an understanding of the instructions.   The patient was advised to call back or seek an in-person evaluation if the symptoms worsen or if the condition fails to improve as anticipated.  I provided 10 minutes of non-face-to-face time during this encounter.   [name], MD

## 2019-10-31 MED ORDER — PANTOPRAZOLE SODIUM 40 MG PO TBEC: 40.0000 mg | DELAYED_RELEASE_TABLET | Freq: Two times a day (BID) | ORAL | 1 refills | Status: DC

## 2019-10-31 NOTE — Progress Notes (Signed)
I have reviewed the above note and agree.  Chistine Dematteo, M.D.  

## 2019-11-25 DIAGNOSIS — M47816 Spondylosis without myelopathy or radiculopathy, lumbar region: Secondary | ICD-10-CM | POA: Diagnosis not present

## 2019-12-01 DIAGNOSIS — M47816 Spondylosis without myelopathy or radiculopathy, lumbar region: Secondary | ICD-10-CM | POA: Diagnosis not present

## 2019-12-08 ENCOUNTER — Telehealth: Payer: Self-pay | Admitting: Family Medicine

## 2019-12-08 DIAGNOSIS — E039 Hypothyroidism, unspecified: Secondary | ICD-10-CM

## 2019-12-08 DIAGNOSIS — I10 Essential (primary) hypertension: Secondary | ICD-10-CM

## 2019-12-08 DIAGNOSIS — R5383 Other fatigue: Secondary | ICD-10-CM

## 2019-12-08 NOTE — Telephone Encounter (Signed)
Pt would like labs ordered

## 2019-12-08 NOTE — Telephone Encounter (Signed)
Patient is requesting that labs be ordered.  Cathy Coffey,cma

## 2019-12-09 NOTE — Telephone Encounter (Signed)
Pt called back to follow up on lab orders.

## 2019-12-12 NOTE — Telephone Encounter (Signed)
It looks like she already has labs ordered. Please get her scheduled for labs.

## 2019-12-13 ENCOUNTER — Other Ambulatory Visit: Payer: Self-pay

## 2019-12-13 NOTE — Telephone Encounter (Signed)
Called and scheduled labs with the patient.  Alece Koppel,cma

## 2019-12-14 ENCOUNTER — Other Ambulatory Visit: Payer: Self-pay

## 2019-12-14 ENCOUNTER — Other Ambulatory Visit (INDEPENDENT_AMBULATORY_CARE_PROVIDER_SITE_OTHER): Payer: Medicare HMO

## 2019-12-14 DIAGNOSIS — E039 Hypothyroidism, unspecified: Secondary | ICD-10-CM

## 2019-12-14 DIAGNOSIS — R5383 Other fatigue: Secondary | ICD-10-CM

## 2019-12-14 DIAGNOSIS — I10 Essential (primary) hypertension: Secondary | ICD-10-CM | POA: Diagnosis not present

## 2019-12-14 LAB — COMPREHENSIVE METABOLIC PANEL
ALT: 17 U/L (ref 0–35)
AST: 18 U/L (ref 0–37)
Albumin: 4.1 g/dL (ref 3.5–5.2)
Alkaline Phosphatase: 89 U/L (ref 39–117)
BUN: 22 mg/dL (ref 6–23)
CO2: 28 mEq/L (ref 19–32)
Calcium: 9.1 mg/dL (ref 8.4–10.5)
Chloride: 104 mEq/L (ref 96–112)
Creatinine, Ser: 0.93 mg/dL (ref 0.40–1.20)
GFR: 58.54 mL/min — ABNORMAL LOW (ref >60.00)
Glucose, Bld: 94 mg/dL (ref 70–99)
Potassium: 3.8 mEq/L (ref 3.5–5.1)
Sodium: 141 mEq/L (ref 135–145)
Total Bilirubin: 0.4 mg/dL (ref 0.2–1.2)
Total Protein: 6.5 g/dL (ref 6.0–8.3)

## 2019-12-14 LAB — LIPID PANEL
Cholesterol: 182 mg/dL (ref 0–200)
HDL: 46.7 mg/dL (ref >39.00)
LDL Cholesterol: 102 mg/dL — ABNORMAL HIGH (ref 0–99)
NonHDL: 135.56
Total CHOL/HDL Ratio: 4
Triglycerides: 167 mg/dL — ABNORMAL HIGH (ref 0.0–149.0)
VLDL: 33.4 mg/dL (ref 0.0–40.0)

## 2019-12-14 LAB — TSH: TSH: 5.51 u[IU]/mL — ABNORMAL HIGH (ref 0.35–4.50)

## 2019-12-14 LAB — LDL CHOLESTEROL, DIRECT: Direct LDL: 111 mg/dL

## 2019-12-14 LAB — VITAMIN D 25 HYDROXY (VIT D DEFICIENCY, FRACTURES): VITD: 32.37 ng/mL (ref 30.00–100.00)

## 2019-12-15 DIAGNOSIS — M47816 Spondylosis without myelopathy or radiculopathy, lumbar region: Secondary | ICD-10-CM | POA: Diagnosis not present

## 2019-12-23 ENCOUNTER — Other Ambulatory Visit: Payer: Self-pay | Admitting: Family Medicine

## 2019-12-23 DIAGNOSIS — E785 Hyperlipidemia, unspecified: Secondary | ICD-10-CM

## 2019-12-23 DIAGNOSIS — E039 Hypothyroidism, unspecified: Secondary | ICD-10-CM

## 2019-12-23 DIAGNOSIS — R69 Illness, unspecified: Secondary | ICD-10-CM | POA: Diagnosis not present

## 2019-12-23 MED ORDER — ROSUVASTATIN CALCIUM 10 MG PO TABS: 10.0000 mg | ORAL_TABLET | Freq: Every day | ORAL | 3 refills | Status: DC

## 2019-12-31 ENCOUNTER — Telehealth: Payer: Self-pay | Admitting: Family Medicine

## 2019-12-31 NOTE — Telephone Encounter (Signed)
Patient states she is having diarrhea and she felt woozy like she did when she had vertigo, no fever patient states she just does not feel good and all this started after she stopped the tramadol. No fever.  Cathy Coffey,cma
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
## 2019-12-31 NOTE — Telephone Encounter (Signed)
It would be unlikely that she would still be having any withdrawal symptoms after being off the medication for 2 weeks. Typically withdrawal symptoms from tramadol should improve within 10 days. Was she having any of the symptoms I asked about?

## 2019-12-31 NOTE — Telephone Encounter (Signed)
Pt has been taking Tramadol on and off for about a month and believes she is having withdrawals. Pt has an upset stomach and feels "woozey". Pt has already spoken with triage this morning and they told her to call us when we opened. Please advise   Jevon Shells,cma

## 2019-12-31 NOTE — Telephone Encounter (Signed)
I would suggest she get looked at in an urgent care to have an in person exam so they can help determine a specific cause.

## 2019-12-31 NOTE — Telephone Encounter (Signed)
How frequently is she taking the tramadol? What dose is she taking? Why is she taking the tramadol? Who prescribed the tramadol? Does she have any other symptoms? Nausea, vomiting, diarrhea, fever?

## 2019-12-31 NOTE — Telephone Encounter (Signed)
Pt has been taking Tramadol on and off for about a month and believes she is having withdrawals. Pt has an upset stomach and feels "woozey". Pt has already spoken with triage this morning and they told her to call us when we opened. Please advise.

## 2019-12-31 NOTE — Telephone Encounter (Signed)
I called and spoke with the patient and informed her that the provider does not think she is having withdrawals form tramadol, he thinks she should be checked out in person at a urgent care, I informed her of this and she understood.  Michiah Mudry,cma

## 2020-01-13 DIAGNOSIS — R569 Unspecified convulsions: Secondary | ICD-10-CM | POA: Diagnosis not present

## 2020-01-13 DIAGNOSIS — G3184 Mild cognitive impairment, so stated: Secondary | ICD-10-CM | POA: Diagnosis not present

## 2020-01-13 DIAGNOSIS — Z79899 Other long term (current) drug therapy: Secondary | ICD-10-CM | POA: Diagnosis not present

## 2020-01-13 DIAGNOSIS — R69 Illness, unspecified: Secondary | ICD-10-CM | POA: Diagnosis not present

## 2020-01-13 DIAGNOSIS — E538 Deficiency of other specified B group vitamins: Secondary | ICD-10-CM | POA: Diagnosis not present

## 2020-01-20 DIAGNOSIS — R69 Illness, unspecified: Secondary | ICD-10-CM | POA: Diagnosis not present

## 2020-01-24 ENCOUNTER — Other Ambulatory Visit (INDEPENDENT_AMBULATORY_CARE_PROVIDER_SITE_OTHER): Payer: Medicare HMO

## 2020-01-24 ENCOUNTER — Other Ambulatory Visit: Payer: Self-pay

## 2020-01-24 DIAGNOSIS — E039 Hypothyroidism, unspecified: Secondary | ICD-10-CM | POA: Diagnosis not present

## 2020-01-24 DIAGNOSIS — E785 Hyperlipidemia, unspecified: Secondary | ICD-10-CM

## 2020-01-24 LAB — LDL CHOLESTEROL, DIRECT: Direct LDL: 54 mg/dL

## 2020-01-24 LAB — HEPATIC FUNCTION PANEL
ALT: 15 U/L (ref 0–35)
AST: 16 U/L (ref 0–37)
Albumin: 4 g/dL (ref 3.5–5.2)
Alkaline Phosphatase: 75 U/L (ref 39–117)
Bilirubin, Direct: 0.1 mg/dL (ref 0.0–0.3)
Total Bilirubin: 0.4 mg/dL (ref 0.2–1.2)
Total Protein: 6.7 g/dL (ref 6.0–8.3)

## 2020-01-24 LAB — TSH: TSH: 1.33 u[IU]/mL (ref 0.35–4.50)

## 2020-01-25 DIAGNOSIS — M47816 Spondylosis without myelopathy or radiculopathy, lumbar region: Secondary | ICD-10-CM | POA: Diagnosis not present

## 2020-01-25 DIAGNOSIS — M48062 Spinal stenosis, lumbar region with neurogenic claudication: Secondary | ICD-10-CM | POA: Diagnosis not present

## 2020-01-25 DIAGNOSIS — M5416 Radiculopathy, lumbar region: Secondary | ICD-10-CM | POA: Diagnosis not present

## 2020-01-25 DIAGNOSIS — M5136 Other intervertebral disc degeneration, lumbar region: Secondary | ICD-10-CM | POA: Diagnosis not present

## 2020-01-25 DIAGNOSIS — M4306 Spondylolysis, lumbar region: Secondary | ICD-10-CM | POA: Diagnosis not present

## 2020-01-27 ENCOUNTER — Other Ambulatory Visit: Payer: Self-pay | Admitting: Neurology

## 2020-01-27 ENCOUNTER — Other Ambulatory Visit (HOSPITAL_COMMUNITY): Payer: Self-pay | Admitting: Neurology

## 2020-01-27 DIAGNOSIS — G3184 Mild cognitive impairment, so stated: Secondary | ICD-10-CM

## 2020-02-05 ENCOUNTER — Other Ambulatory Visit: Payer: Self-pay | Admitting: Family Medicine

## 2020-02-18 ENCOUNTER — Ambulatory Visit (HOSPITAL_COMMUNITY)
Admission: RE | Admit: 2020-02-18 | Discharge: 2020-02-18 | Disposition: A | Discharge status: Home / Self Care | Payer: Medicare HMO | Source: Ambulatory Visit | Attending: Neurology | Admitting: Neurology

## 2020-02-18 ENCOUNTER — Other Ambulatory Visit: Payer: Self-pay

## 2020-02-18 DIAGNOSIS — R930 Abnormal findings on diagnostic imaging of skull and head, not elsewhere classified: Secondary | ICD-10-CM | POA: Diagnosis not present

## 2020-02-18 DIAGNOSIS — G3184 Mild cognitive impairment, so stated: Secondary | ICD-10-CM | POA: Insufficient documentation

## 2020-04-18 DIAGNOSIS — G3184 Mild cognitive impairment, so stated: Secondary | ICD-10-CM | POA: Diagnosis not present

## 2020-04-18 DIAGNOSIS — E538 Deficiency of other specified B group vitamins: Secondary | ICD-10-CM | POA: Diagnosis not present

## 2020-04-18 DIAGNOSIS — R69 Illness, unspecified: Secondary | ICD-10-CM | POA: Diagnosis not present

## 2020-05-02 DIAGNOSIS — H52223 Regular astigmatism, bilateral: Secondary | ICD-10-CM | POA: Diagnosis not present

## 2020-05-02 DIAGNOSIS — H35369 Drusen (degenerative) of macula, unspecified eye: Secondary | ICD-10-CM | POA: Diagnosis not present

## 2020-05-02 DIAGNOSIS — H40023 Open angle with borderline findings, high risk, bilateral: Secondary | ICD-10-CM | POA: Diagnosis not present

## 2020-05-02 DIAGNOSIS — H5203 Hypermetropia, bilateral: Secondary | ICD-10-CM | POA: Diagnosis not present

## 2020-05-02 DIAGNOSIS — H10231 Serous conjunctivitis, except viral, right eye: Secondary | ICD-10-CM | POA: Diagnosis not present

## 2020-05-04 DIAGNOSIS — H35369 Drusen (degenerative) of macula, unspecified eye: Secondary | ICD-10-CM | POA: Diagnosis not present

## 2020-05-04 DIAGNOSIS — H40023 Open angle with borderline findings, high risk, bilateral: Secondary | ICD-10-CM | POA: Diagnosis not present

## 2020-05-04 DIAGNOSIS — H5203 Hypermetropia, bilateral: Secondary | ICD-10-CM | POA: Diagnosis not present

## 2020-05-04 DIAGNOSIS — H52223 Regular astigmatism, bilateral: Secondary | ICD-10-CM | POA: Diagnosis not present

## 2020-05-04 DIAGNOSIS — H10231 Serous conjunctivitis, except viral, right eye: Secondary | ICD-10-CM | POA: Diagnosis not present

## 2020-05-22 ENCOUNTER — Other Ambulatory Visit: Payer: Self-pay | Admitting: Family Medicine

## 2020-05-22 DIAGNOSIS — R1013 Epigastric pain: Secondary | ICD-10-CM

## 2020-06-19 DIAGNOSIS — H5203 Hypermetropia, bilateral: Secondary | ICD-10-CM | POA: Diagnosis not present

## 2020-06-19 DIAGNOSIS — H52223 Regular astigmatism, bilateral: Secondary | ICD-10-CM | POA: Diagnosis not present

## 2020-06-19 DIAGNOSIS — H524 Presbyopia: Secondary | ICD-10-CM | POA: Diagnosis not present

## 2020-06-19 DIAGNOSIS — H40023 Open angle with borderline findings, high risk, bilateral: Secondary | ICD-10-CM | POA: Diagnosis not present

## 2020-06-19 DIAGNOSIS — H35363 Drusen (degenerative) of macula, bilateral: Secondary | ICD-10-CM | POA: Diagnosis not present

## 2020-07-07 DIAGNOSIS — U071 COVID-19: Secondary | ICD-10-CM | POA: Diagnosis not present

## 2020-07-09 ENCOUNTER — Encounter: Payer: Self-pay | Admitting: Internal Medicine

## 2020-07-09 ENCOUNTER — Inpatient Hospital Stay: Payer: Medicare HMO

## 2020-07-09 ENCOUNTER — Other Ambulatory Visit: Payer: Self-pay

## 2020-07-09 ENCOUNTER — Inpatient Hospital Stay
Admission: EM | Admit: 2020-07-09 | Discharge: 2020-07-11 | DRG: 177 | Disposition: A | Discharge status: Home / Self Care | Payer: Medicare HMO | Attending: Internal Medicine | Admitting: Internal Medicine

## 2020-07-09 ENCOUNTER — Emergency Department: Payer: Medicare HMO

## 2020-07-09 DIAGNOSIS — E876 Hypokalemia: Secondary | ICD-10-CM | POA: Insufficient documentation

## 2020-07-09 DIAGNOSIS — J984 Other disorders of lung: Secondary | ICD-10-CM | POA: Diagnosis not present

## 2020-07-09 DIAGNOSIS — M810 Age-related osteoporosis without current pathological fracture: Secondary | ICD-10-CM | POA: Diagnosis present

## 2020-07-09 DIAGNOSIS — R55 Syncope and collapse: Secondary | ICD-10-CM | POA: Insufficient documentation

## 2020-07-09 DIAGNOSIS — Z7983 Long term (current) use of bisphosphonates: Secondary | ICD-10-CM

## 2020-07-09 DIAGNOSIS — Z79890 Hormone replacement therapy (postmenopausal): Secondary | ICD-10-CM | POA: Diagnosis not present

## 2020-07-09 DIAGNOSIS — J45909 Unspecified asthma, uncomplicated: Secondary | ICD-10-CM | POA: Diagnosis present

## 2020-07-09 DIAGNOSIS — Z79899 Other long term (current) drug therapy: Secondary | ICD-10-CM | POA: Diagnosis not present

## 2020-07-09 DIAGNOSIS — U071 COVID-19: Secondary | ICD-10-CM | POA: Diagnosis not present

## 2020-07-09 DIAGNOSIS — R197 Diarrhea, unspecified: Secondary | ICD-10-CM

## 2020-07-09 DIAGNOSIS — E039 Hypothyroidism, unspecified: Secondary | ICD-10-CM | POA: Diagnosis not present

## 2020-07-09 DIAGNOSIS — I700 Atherosclerosis of aorta: Secondary | ICD-10-CM | POA: Diagnosis not present

## 2020-07-09 DIAGNOSIS — I447 Left bundle-branch block, unspecified: Secondary | ICD-10-CM | POA: Diagnosis present

## 2020-07-09 DIAGNOSIS — R112 Nausea with vomiting, unspecified: Secondary | ICD-10-CM | POA: Diagnosis not present

## 2020-07-09 DIAGNOSIS — I10 Essential (primary) hypertension: Secondary | ICD-10-CM | POA: Diagnosis not present

## 2020-07-09 DIAGNOSIS — Z6832 Body mass index (BMI) 32.0-32.9, adult: Secondary | ICD-10-CM

## 2020-07-09 DIAGNOSIS — J1282 Pneumonia due to coronavirus disease 2019: Secondary | ICD-10-CM

## 2020-07-09 DIAGNOSIS — K219 Gastro-esophageal reflux disease without esophagitis: Secondary | ICD-10-CM | POA: Diagnosis not present

## 2020-07-09 DIAGNOSIS — I340 Nonrheumatic mitral (valve) insufficiency: Secondary | ICD-10-CM | POA: Diagnosis not present

## 2020-07-09 DIAGNOSIS — E669 Obesity, unspecified: Secondary | ICD-10-CM | POA: Diagnosis present

## 2020-07-09 DIAGNOSIS — R0902 Hypoxemia: Secondary | ICD-10-CM | POA: Diagnosis present

## 2020-07-09 DIAGNOSIS — J189 Pneumonia, unspecified organism: Secondary | ICD-10-CM | POA: Diagnosis not present

## 2020-07-09 DIAGNOSIS — R4020 Unspecified coma: Secondary | ICD-10-CM | POA: Diagnosis not present

## 2020-07-09 DIAGNOSIS — R1111 Vomiting without nausea: Secondary | ICD-10-CM | POA: Diagnosis not present

## 2020-07-09 HISTORY — DX: Pneumonia due to coronavirus disease 2019: J12.82

## 2020-07-09 HISTORY — DX: COVID-19: U07.1

## 2020-07-09 LAB — URINALYSIS, COMPLETE (UACMP) WITH MICROSCOPIC
Bacteria, UA: NONE SEEN
Bilirubin Urine: NEGATIVE
Glucose, UA: NEGATIVE mg/dL
Hgb urine dipstick: NEGATIVE
Ketones, ur: NEGATIVE mg/dL
Leukocytes,Ua: NEGATIVE
Nitrite: NEGATIVE
Protein, ur: NEGATIVE mg/dL
Specific Gravity, Urine: 1.006 (ref 1.005–1.030)
Squamous Epithelial / HPF: NONE SEEN (ref 0–5)
pH: 7 (ref 5.0–8.0)

## 2020-07-09 LAB — CBC
HCT: 40.3 % (ref 36.0–46.0)
Hemoglobin: 13.2 g/dL (ref 12.0–15.0)
MCH: 28.7 pg (ref 26.0–34.0)
MCHC: 32.8 g/dL (ref 30.0–36.0)
MCV: 87.6 fL (ref 80.0–100.0)
Platelets: 134 10*3/uL — ABNORMAL LOW (ref 150–400)
RBC: 4.6 MIL/uL (ref 3.87–5.11)
RDW: 13.6 % (ref 11.5–15.5)
WBC: 3.4 10*3/uL — ABNORMAL LOW (ref 4.0–10.5)
nRBC: 0 % (ref 0.0–0.2)

## 2020-07-09 LAB — BRAIN NATRIURETIC PEPTIDE: B Natriuretic Peptide: 22 pg/mL (ref 0.0–100.0)

## 2020-07-09 LAB — CBC WITH DIFFERENTIAL/PLATELET
Abs Immature Granulocytes: 0.02 10*3/uL (ref 0.00–0.07)
Basophils Absolute: 0 10*3/uL (ref 0.0–0.1)
Basophils Relative: 0 %
Eosinophils Absolute: 0.1 10*3/uL (ref 0.0–0.5)
Eosinophils Relative: 2 %
HCT: 40.2 % (ref 36.0–46.0)
Hemoglobin: 13.1 g/dL (ref 12.0–15.0)
Immature Granulocytes: 1 %
Lymphocytes Relative: 24 %
Lymphs Abs: 1 10*3/uL (ref 0.7–4.0)
MCH: 28.2 pg (ref 26.0–34.0)
MCHC: 32.6 g/dL (ref 30.0–36.0)
MCV: 86.6 fL (ref 80.0–100.0)
Monocytes Absolute: 0.6 10*3/uL (ref 0.1–1.0)
Monocytes Relative: 14 %
Neutro Abs: 2.4 10*3/uL (ref 1.7–7.7)
Neutrophils Relative %: 59 %
Platelets: 134 10*3/uL — ABNORMAL LOW (ref 150–400)
RBC: 4.64 MIL/uL (ref 3.87–5.11)
RDW: 13.7 % (ref 11.5–15.5)
WBC: 4.1 10*3/uL (ref 4.0–10.5)
nRBC: 0 % (ref 0.0–0.2)

## 2020-07-09 LAB — COMPREHENSIVE METABOLIC PANEL
ALT: 24 U/L (ref 0–44)
AST: 30 U/L (ref 15–41)
Albumin: 3.9 g/dL (ref 3.5–5.0)
Alkaline Phosphatase: 62 U/L (ref 38–126)
Anion gap: 13 (ref 5–15)
BUN: 18 mg/dL (ref 8–23)
CO2: 24 mmol/L (ref 22–32)
Calcium: 9.3 mg/dL (ref 8.9–10.3)
Chloride: 104 mmol/L (ref 98–111)
Creatinine, Ser: 0.81 mg/dL (ref 0.44–1.00)
GFR calc Af Amer: >60 mL/min (ref >60)
GFR calc non Af Amer: >60 mL/min (ref >60)
Glucose, Bld: 138 mg/dL — ABNORMAL HIGH (ref 70–99)
Potassium: 2.8 mmol/L — ABNORMAL LOW (ref 3.5–5.1)
Sodium: 141 mmol/L (ref 135–145)
Total Bilirubin: 0.6 mg/dL (ref 0.3–1.2)
Total Protein: 7 g/dL (ref 6.5–8.1)

## 2020-07-09 LAB — CREATININE, SERUM
Creatinine, Ser: 0.67 mg/dL (ref 0.44–1.00)
GFR calc Af Amer: >60 mL/min (ref >60)
GFR calc non Af Amer: >60 mL/min (ref >60)

## 2020-07-09 LAB — POTASSIUM: Potassium: 3.5 mmol/L (ref 3.5–5.1)

## 2020-07-09 LAB — ABO/RH: ABO/RH(D): A NEG

## 2020-07-09 LAB — TSH: TSH: 0.331 u[IU]/mL — ABNORMAL LOW (ref 0.350–4.500)

## 2020-07-09 LAB — MAGNESIUM: Magnesium: 2.1 mg/dL (ref 1.7–2.4)

## 2020-07-09 LAB — GLUCOSE, CAPILLARY: Glucose-Capillary: 127 mg/dL — ABNORMAL HIGH (ref 70–99)

## 2020-07-09 LAB — PROCALCITONIN: Procalcitonin: <0.1 ng/mL

## 2020-07-09 LAB — TROPONIN I (HIGH SENSITIVITY)
Troponin I (High Sensitivity): 6 ng/L (ref <18)
Troponin I (High Sensitivity): 13 ng/L (ref <18)

## 2020-07-09 MED ORDER — ENOXAPARIN SODIUM 40 MG/0.4ML ~~LOC~~ SOLN
40.0000 mg | SUBCUTANEOUS | Status: DC
  Administered 2020-07-09 – 2020-07-11 (×3): 40 mg via SUBCUTANEOUS
  Filled 2020-07-09 (×3): qty 0.4

## 2020-07-09 MED ORDER — POTASSIUM CHLORIDE IN NACL 40-0.9 MEQ/L-% IV SOLN
INTRAVENOUS | Status: DC
  Filled 2020-07-09 (×2): qty 1000

## 2020-07-09 MED ORDER — ONDANSETRON HCL 4 MG/2ML IJ SOLN: 4.0000 mg | Freq: Four times a day (QID) | INTRAMUSCULAR | Status: DC | PRN

## 2020-07-09 MED ORDER — LEVOTHYROXINE SODIUM 100 MCG PO TABS: 100.0000 ug | ORAL_TABLET | Freq: Every day | ORAL | Status: DC

## 2020-07-09 MED ORDER — ACETAMINOPHEN 500 MG PO TABS
1000.0000 mg | ORAL_TABLET | Freq: Four times a day (QID) | ORAL | Status: DC | PRN
  Administered 2020-07-10 – 2020-07-11 (×4): 1000 mg via ORAL
  Filled 2020-07-09 (×4): qty 2

## 2020-07-09 MED ORDER — LEVOTHYROXINE SODIUM 100 MCG PO TABS
100.0000 ug | ORAL_TABLET | Freq: Every day | ORAL | Status: DC
  Administered 2020-07-10 – 2020-07-11 (×2): 100 ug via ORAL
  Filled 2020-07-09 (×2): qty 1

## 2020-07-09 MED ORDER — IPRATROPIUM BROMIDE HFA 17 MCG/ACT IN AERS
2.0000 | INHALATION_SPRAY | Freq: Four times a day (QID) | RESPIRATORY_TRACT | Status: DC
  Administered 2020-07-09 – 2020-07-11 (×9): 2 via RESPIRATORY_TRACT
  Filled 2020-07-09: qty 12.9

## 2020-07-09 MED ORDER — DEXAMETHASONE SODIUM PHOSPHATE 10 MG/ML IJ SOLN
6.0000 mg | Freq: Once | INTRAMUSCULAR | Status: AC
  Administered 2020-07-09: 6 mg via INTRAVENOUS
  Filled 2020-07-09: qty 1

## 2020-07-09 MED ORDER — ONDANSETRON HCL 4 MG PO TABS: 4.0000 mg | ORAL_TABLET | Freq: Four times a day (QID) | ORAL | Status: DC | PRN

## 2020-07-09 MED ORDER — GLUCOSAMINE HCL 1000 MG PO TABS: 1.0000 | ORAL_TABLET | Freq: Every day | ORAL | Status: DC

## 2020-07-09 MED ORDER — NIACIN ER (ANTIHYPERLIPIDEMIC) 500 MG PO TBCR
500.0000 mg | EXTENDED_RELEASE_TABLET | Freq: Every day | ORAL | Status: DC
  Administered 2020-07-09 – 2020-07-10 (×2): 500 mg via ORAL
  Filled 2020-07-09 (×3): qty 1

## 2020-07-09 MED ORDER — SODIUM CHLORIDE 0.9 % IV SOLN
200.0000 mg | Freq: Once | INTRAVENOUS | Status: AC
  Administered 2020-07-09: 200 mg via INTRAVENOUS
  Filled 2020-07-09: qty 200

## 2020-07-09 MED ORDER — SODIUM CHLORIDE 0.9 % IV SOLN
100.0000 mg | Freq: Every day | INTRAVENOUS | Status: DC
  Administered 2020-07-10 – 2020-07-11 (×2): 100 mg via INTRAVENOUS
  Filled 2020-07-09: qty 100
  Filled 2020-07-09 (×2): qty 20

## 2020-07-09 MED ORDER — SENNA 8.6 MG PO TABS
1.0000 | ORAL_TABLET | Freq: Two times a day (BID) | ORAL | Status: DC
  Administered 2020-07-10 – 2020-07-11 (×3): 8.6 mg via ORAL
  Filled 2020-07-09 (×4): qty 1

## 2020-07-09 MED ORDER — POTASSIUM CHLORIDE 10 MEQ/100ML IV SOLN
10.0000 meq | INTRAVENOUS | Status: DC
  Administered 2020-07-09: 10 meq via INTRAVENOUS
  Filled 2020-07-09: qty 100

## 2020-07-09 MED ORDER — AMLODIPINE BESYLATE 5 MG PO TABS
2.5000 mg | ORAL_TABLET | Freq: Every day | ORAL | Status: DC
  Administered 2020-07-10 – 2020-07-11 (×2): 2.5 mg via ORAL
  Filled 2020-07-09 (×2): qty 1

## 2020-07-09 MED ORDER — POTASSIUM CHLORIDE 10 MEQ/100ML IV SOLN
10.0000 meq | INTRAVENOUS | Status: AC
  Administered 2020-07-09 – 2020-07-10 (×5): 10 meq via INTRAVENOUS
  Filled 2020-07-09 (×2): qty 100

## 2020-07-09 MED ORDER — IOHEXOL 350 MG/ML SOLN
75.0000 mL | Freq: Once | INTRAVENOUS | Status: AC | PRN
  Administered 2020-07-09: 75 mL via INTRAVENOUS

## 2020-07-09 MED ORDER — ASCORBIC ACID 500 MG PO TABS
500.0000 mg | ORAL_TABLET | Freq: Every day | ORAL | Status: DC
  Administered 2020-07-09 – 2020-07-11 (×3): 500 mg via ORAL
  Filled 2020-07-09 (×3): qty 1

## 2020-07-09 MED ORDER — PANTOPRAZOLE SODIUM 40 MG PO TBEC
40.0000 mg | DELAYED_RELEASE_TABLET | Freq: Two times a day (BID) | ORAL | Status: DC
  Administered 2020-07-09 – 2020-07-11 (×4): 40 mg via ORAL
  Filled 2020-07-09 (×4): qty 1

## 2020-07-09 MED ORDER — ROSUVASTATIN CALCIUM 10 MG PO TABS
10.0000 mg | ORAL_TABLET | Freq: Every day | ORAL | Status: DC
  Administered 2020-07-10 – 2020-07-11 (×2): 10 mg via ORAL
  Filled 2020-07-09 (×2): qty 1

## 2020-07-09 NOTE — H&P (Signed)
[facility] NAME: Cathy Coffey    MR#:  [id number]  DATE OF BIRTH:  03-14-1941  DATE OF ADMISSION:  07/09/2020  PRIMARY CARE PHYSICIAN: Leone Haven, MD   REQUESTING/REFERRING PHYSICIAN: Dr. Hulan Saas  Patient coming from :home   CHIEF COMPLAINT:  I passed out today  HISTORY OF PRESENT ILLNESS:  Cathy Coffey  is a 79 y.o. female with a known history of hypertension, hypothyroidism, asthma, mild cognitive decline follows with Dr. Manuella Ghazi neurology was tested positive for COVID 19 on 07/07/2020 when she went to urgent care. Patient came in with symptoms of vomiting and passing out spell at home. No details available. She has been feeling poorly per past week.  Patient is unvaccinated  ED course: in the ER afebrile heart rate 72 during my evaluation blood pressure  114/53. Earlier per ER charge nurse she went to check in the room and found patient's blood pressure was in the 80s. Her monitor read 04 heart rate. Patient had rolled her eyes up and was unresponsive. Charge nurse did CPR for 10 seconds and called for help. Patient woke up head good pulse and blood pressure.  Since then patient has been hemodynamically stable with heart rate in the 70s. Blood pressure is stable.  She was found to have potassium of 2.8-- got IV replacement. Her oxygen saturation's are hundred percent on room air  Chest x-ray showed1. Mild bibasilar interstitial thickening, which could reflect viral pneumonia given clinical history. 2. Right paratracheal triangular density. Chest CT with contrast is recommended for further evaluation.  Pt is started on IV remdesivir PAST MEDICAL HISTORY:   Past Medical History:  Diagnosis Date  . Asthma   . Diverticulosis   . Hypertension   . Hypothyroidism   . IBS (irritable bowel syndrome)   . Lichen planus   . Pancreatitis due to common bile duct stone 2000    PAST SURGICAL HISTOIRY:   Past  Surgical History:  Procedure Laterality Date  . CHOLECYSTECTOMY    . COLONOSCOPY  2011  . HERNIA REPAIR    . MOUTH SURGERY  09/2016  . TUBAL LIGATION      SOCIAL HISTORY:   Social History   Tobacco Use  . Smoking status: Never Smoker  . Smokeless tobacco: Never Used  Substance Use Topics  . Alcohol use: No    FAMILY HISTORY:   Family History  Problem Relation Age of Onset  . Hypothyroidism Mother   . Hypertension Mother   . Dementia Father   . Dementia Paternal Grandmother     DRUG ALLERGIES:   Allergies  Allergen Reactions  . Augmentin [Amoxicillin-Pot Clavulanate] Itching    Hives     REVIEW OF SYSTEMS:  Review of Systems  Constitutional: Positive for malaise/fatigue. Negative for chills, fever and weight loss.  HENT: Negative for ear discharge, ear pain and nosebleeds.   Eyes: Negative for blurred vision, pain and discharge.  Respiratory: Positive for shortness of breath. Negative for sputum production, wheezing and stridor.   Cardiovascular: Negative for chest pain, palpitations, orthopnea and PND.  Gastrointestinal: Negative for abdominal pain, diarrhea, nausea and vomiting.  Genitourinary: Negative for frequency and urgency.  Musculoskeletal: Negative for back pain and joint pain.  Neurological: Positive for weakness. Negative for sensory change, speech change and focal weakness.  Psychiatric/Behavioral: Negative for depression and hallucinations. The patient is not nervous/anxious.      MEDICATIONS AT HOME:   Prior to Admission medications  Medication Sig Start Date End Date Taking? Authorizing Provider  alendronate (FOSAMAX) 70 MG tablet TAKE 1 TABLET EVERY 7 DAYS.TAKE WITH A FULL GLASS OF  WATER ON AN EMPTY STOMACH. 02/07/20   Leone Haven, MD  Alum & Mag Hydroxide-Simeth (ANTACID ANTI-GAS PO) Take by mouth.    [provider]  amLODipine (NORVASC) 2.5 MG tablet Take 1 tablet (2.5 mg total) by mouth daily. 10/22/19   Leone Haven, MD  Ascorbic Acid (VITAMIN C) 100 MG tablet Take 100 mg by mouth daily.    [provider]  B Complex-C (SUPER B COMPLEX PO) Take 1 capsule by mouth.    [provider]  BIOTIN 5000 PO Take by mouth.    [provider]  Calcium Carbonate-Vitamin D (CALCIUM-VITAMIN D) 500-200 MG-UNIT per tablet Take 3 tablets by mouth daily.     [provider]  desonide (DESOWEN) 0.05 % cream Apply topically 2 (two) times daily. 06/03/16   Jackolyn Confer, MD  DIGESTIVE AIDS MIXTURE PO Take by mouth.    [provider]  fluocinonide gel (LIDEX) 0.05 %  04/07/19   [provider]  furosemide (LASIX) 20 MG tablet Take 1 tablet (20 mg total) by mouth daily. 10/22/19   Leone Haven, MD  Glucosamine HCl 1000 MG TABS Take 1 tablet by mouth.    [provider]  levothyroxine (SYNTHROID) 100 MCG tablet Take 1 tablet (100 mcg total) by mouth daily. 10/22/19   Leone Haven, MD  Magnesium 250 MG TABS Take 500 mg by mouth.     [provider]  Misc Natural Products (ADVANCED JOINT RELIEF PO) Take by mouth.    [provider]  Multiple Vitamins-Minerals (PRESERVISION AREDS PO) Take by mouth.    [provider]  NIACIN, ANTIHYPERLIPIDEMIC, PO Take 500 mg by mouth.    [provider]  Omega-3 Fatty Acids (FISH OIL) 1000 MG CPDR Take 3,000 mg by mouth daily.     [provider]  pantoprazole (PROTONIX) 40 MG tablet TAKE 1 TABLET TWICE DAILY 05/22/20   Leone Haven, MD  POTASSIUM PO Take 600 mg by mouth.    [provider]  Probiotic Product (TRUBIOTICS PO) Take 1 capsule by mouth.    [provider]  rosuvastatin (CRESTOR) 10 MG tablet Take 1 tablet (10 mg total) by mouth daily. 12/23/19   Leone Haven, MD  vitamin A 10000 UNIT capsule Take 10,000 Units by mouth daily.    [provider]      VITAL SIGNS:  Blood pressure 106/66, pulse 67, temperature 97.6 F (36.4  C), temperature source Oral, resp. rate 18, height 5' (1.524 m), weight 74.8 kg, SpO2 90 %.  PHYSICAL EXAMINATION:  GENERAL:  79 y.o.-year-old patient lying in the bed with no acute distress. weak EYES: Pupils equal, round, reactive to light and accommodation. No scleral icterus.  HEENT: Head atraumatic, normocephalic. Oropharynx and nasopharynx clear.  NECK:  Supple, no jugular venous distention. No thyroid enlargement, no tenderness.  LUNGS: Normal breath sounds bilaterally, no wheezing, rales,rhonchi or crepitation. No use of accessory muscles of respiration.  CARDIOVASCULAR: S1, S2 normal. No murmurs, rubs, or gallops.  ABDOMEN: Soft, nontender, nondistended. Bowel sounds present. No organomegaly or mass.  EXTREMITIES: No pedal edema, cyanosis, or clubbing.  NEUROLOGIC: Cranial nerves II through XII are intact. Muscle strength 5/5 in all extremities. Sensation intact. Gait not checked. weak PSYCHIATRIC: The patient is alert and oriented x 3.  SKIN: No obvious rash, lesion, or ulcer.   LABORATORY PANEL:   CBC Recent Labs  Lab 07/09/20 1217  WBC 4.1  HGB 13.1  HCT 40.2  PLT 134*   ------------------------------------------------------------------------------------------------------------------  Chemistries  Recent Labs  Lab 07/09/20 1217  NA 141  K 2.8*  CL 104  CO2 24  GLUCOSE 138*  BUN 18  CREATININE 0.81  CALCIUM 9.3  MG 2.1  AST 30  ALT 24  ALKPHOS 62  BILITOT 0.6   ------------------------------------------------------------------------------------------------------------------  Cardiac Enzymes No results for input(s): TROPONINI in the last 168 hours. ------------------------------------------------------------------------------------------------------------------  RADIOLOGY:  DG Chest 1 View  Result Date: 07/09/2020 CLINICAL DATA:  Syncope.  COVID-19 positive. EXAM: CHEST  1 VIEW COMPARISON:  Chest x-ray dated February 03, 2013. FINDINGS: Normal heart  size. Right paratracheal triangular density. Mild interstitial thickening at the lung bases. No focal consolidation, pleural effusion, or pneumothorax. No acute osseous abnormality. IMPRESSION: 1. Mild bibasilar interstitial thickening, which could reflect viral pneumonia given clinical history. 2. Right paratracheal triangular density. Chest CT with contrast is recommended for further evaluation. Electronically Signed   By: Titus Dubin M.D.   On: 07/09/2020 13:33    EKG:    IMPRESSION AND PLAN:   Storey Stangeland  is a 79 y.o. female with a known history of hypertension, hypothyroidism, asthma, mild cognitive decline follows with Dr. Manuella Ghazi neurology was tested positive for COVID 19 on 07/07/2020 when she went to urgent care. Patient came in with symptoms of vomiting and passing out spell at home. No details available. She has been feeling poorly per past week.  1. syncope with transient CPR in the ER (as above) -admit to [facility] with tele -IV fluids with potassium -patient appears to be in sinus rhythm. EKG shows left bundle branch block -cardiology consultation with Dr. Rockey Situ informed by secure chat -echo  2. Hypokalemia -pharmacy consult -magnesium 2.1  3. Pneumonia due to COVID-19 -IV Remdesivir -follow-up CRP and D dimer -incentive spirometer, bronchodilator as needed -patient saturations 99% on room air. Oxygen as needed -no indication for steroids  4. Hypertension -patient is on amlodipine 2.5 mg daily-- can be started from tomorrow  5. Hypothyroidism -continue Synthroid  6. History of osteoporosis -continue calcium supplements and Fosamax  7. Mild cognitive decline -patient follows with neurology Dr. Manuella Ghazi as outpatient  8. DVT prophylaxis subcu Lovenox  Family Communication : none Consults : cardiology Dr. Rockey Situ Code Status : full DVT prophylaxis : Lovenox  TOTAL TIME TAKING CARE OF THIS PATIENT: *50* minutes.    Fritzi Mandes M.D  [facility]     CC: Primary care physician; Leone Haven, MD

## 2020-07-09 NOTE — ED Notes (Signed)
Pt received 1L LR when pt had previous episode of asystole

## 2020-07-09 NOTE — ED Provider Notes (Signed)
[facility] Emergency Department Provider Note  ____________________________________________   First MD Initiated Contact with Patient 07/09/20 1211     (approximate)  I have reviewed the triage vital signs and the nursing notes.   HISTORY  Chief Complaint Loss of Consciousness, Nausea, and Emesis   HPI Cathy Coffey is a 79 y.o. female with a past medical history of asthma, HTN, hypothyroidism, depression, GERD, and HTN who presents to EMS after report of a syncopal episode.  This is a context of recently being diagnosed with COVID-19 on 8/27.  Patient states she does not recall exactly what happened but per EMS she had a witnessed syncopal episode.  Patient endorses several days of worsening nausea, vomiting, diarrhea, generalized weakness.  She also endorses some cough and general myalgias and severe fatigue.  No chest pain, rash, recent falls or injuries, extremity pain or weakness, or other acute complaints.  Denies tobacco abuse or EtOH use.         Past Medical History:  Diagnosis Date  . Asthma   . Diverticulosis   . Hypertension   . Hypothyroidism   . IBS (irritable bowel syndrome)   . Lichen planus   . 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 Pneumonia due to COVID-19 virus 07/09/2020  . Syncope and collapse   . Nausea vomiting and diarrhea   . Hypokalemia   . Depression 10/26/2018  . Left bundle branch block 02/18/2018  . GERD (gastroesophageal reflux disease) 11/13/2017  . Osteoporosis 09/02/2017  . Chronic knee pain 01/29/2017  . OSA (obstructive sleep apnea) 01/29/2017  . Tiredness 01/29/2017  . Memory difficulty 11/21/2016  . Atrophic vaginitis 05/13/2016  . Lumbago 02/27/2015  . Toenail deformity 02/27/2015  . NSAID long-term use 11/21/2014  . Hot flashes 04/22/2014  . IBS (irritable bowel syndrome) 07/27/2013  . Screening for breast cancer 07/27/2013  .  Hypertension 07/18/2011  . Hypothyroidism 07/18/2011    Past Surgical History:  Procedure Laterality Date  . CHOLECYSTECTOMY    . COLONOSCOPY  2011  . HERNIA REPAIR    . MOUTH SURGERY  09/2016  . TUBAL LIGATION      Prior to Admission medications   Medication Sig Start Date End Date Taking? Authorizing Provider  alendronate (FOSAMAX) 70 MG tablet TAKE 1 TABLET EVERY 7 DAYS.TAKE WITH A FULL GLASS OF  WATER ON AN EMPTY STOMACH. 02/07/20   Leone Haven, MD  Alum & Mag Hydroxide-Simeth (ANTACID ANTI-GAS PO) Take by mouth.    [provider]  amLODipine (NORVASC) 2.5 MG tablet Take 1 tablet (2.5 mg total) by mouth daily. 10/22/19   Leone Haven, MD  Ascorbic Acid (VITAMIN C) 100 MG tablet Take 100 mg by mouth daily.    [provider]  B Complex-C (SUPER B COMPLEX PO) Take 1 capsule by mouth.    [provider]  BIOTIN 5000 PO Take by mouth.    [provider]  Calcium Carbonate-Vitamin D (CALCIUM-VITAMIN D) 500-200 MG-UNIT per tablet Take 3 tablets by mouth daily.     [provider]  desonide (DESOWEN) 0.05 % cream Apply topically 2 (two) times daily. 06/03/16   Jackolyn Confer, MD  DIGESTIVE AIDS MIXTURE PO Take by mouth.    [provider]  fluocinonide gel (LIDEX) 0.05 %  04/07/19   [provider]  furosemide (LASIX) 20 MG tablet Take 1 tablet (20  mg total) by mouth daily. 10/22/19   Leone Haven, MD  Glucosamine HCl 1000 MG TABS Take 1 tablet by mouth.    [provider]  levothyroxine (SYNTHROID) 100 MCG tablet Take 1 tablet (100 mcg total) by mouth daily. 10/22/19   Leone Haven, MD  Magnesium 250 MG TABS Take 500 mg by mouth.     [provider]  Misc Natural Products (ADVANCED JOINT RELIEF PO) Take by mouth.    [provider]  Multiple Vitamins-Minerals (PRESERVISION AREDS PO) Take by mouth.    [provider]  NIACIN, ANTIHYPERLIPIDEMIC, PO Take 500 mg by  mouth.    [provider]  Omega-3 Fatty Acids (FISH OIL) 1000 MG CPDR Take 3,000 mg by mouth daily.     [provider]  pantoprazole (PROTONIX) 40 MG tablet TAKE 1 TABLET TWICE DAILY 05/22/20   Leone Haven, MD  POTASSIUM PO Take 600 mg by mouth.    [provider]  Probiotic Product (TRUBIOTICS PO) Take 1 capsule by mouth.    [provider]  rosuvastatin (CRESTOR) 10 MG tablet Take 1 tablet (10 mg total) by mouth daily. 12/23/19   Leone Haven, MD  vitamin A 10000 UNIT capsule Take 10,000 Units by mouth daily.    [provider]    Allergies Augmentin [amoxicillin-pot clavulanate]  Family History  Problem Relation Age of Onset  . Hypothyroidism Mother   . Hypertension Mother   . Dementia Father   . Dementia Paternal Grandmother     Social History Social History   Tobacco Use  . Smoking status: Never Smoker  . Smokeless tobacco: Never Used  Substance Use Topics  . Alcohol use: No  . Drug use: No    Review of Systems  Review of Systems  Constitutional: Positive for chills and malaise/fatigue.  HENT: Negative for sore throat.   Eyes: Negative for pain.  Respiratory: Positive for cough. Negative for stridor.   Cardiovascular: Negative for chest pain.  Gastrointestinal: Positive for diarrhea, nausea and vomiting.  Musculoskeletal: Positive for myalgias.  Skin: Negative for rash.  Neurological: Positive for loss of consciousness. Negative for seizures and headaches.  Psychiatric/Behavioral: Negative for suicidal ideas.  All other systems reviewed and are negative.     ____________________________________________   PHYSICAL EXAM:  VITAL SIGNS: ED Triage Vitals  Enc Vitals Group     BP      Pulse      Resp      Temp      Temp src      SpO2      Weight      Height      Head Circumference      Peak Flow      Pain Score      Pain Loc      Pain Edu?      Excl. in [place]?    Vitals:   07/09/20 1211  BP:  106/66  Pulse: 67  Resp: 18  Temp: 97.6 F (36.4 C)  SpO2: 90%   Physical Exam Vitals and nursing note reviewed.  Constitutional:      General: She is not in acute distress.    Appearance: She is well-developed. She is ill-appearing.  HENT:     Head: Normocephalic and atraumatic.     Right Ear: External ear normal.     Left Ear: External ear normal.     Nose: Nose normal.     Mouth/Throat:  Mouth: Mucous membranes are dry.  Eyes:     Conjunctiva/sclera: Conjunctivae normal.  Cardiovascular:     Rate and Rhythm: Normal rate and regular rhythm.     Heart sounds: No murmur heard.   Pulmonary:     Effort: Pulmonary effort is normal. No respiratory distress.     Breath sounds: Normal breath sounds.  Abdominal:     Palpations: Abdomen is soft.     Tenderness: There is no abdominal tenderness.  Musculoskeletal:     Cervical back: Neck supple.     Right lower leg: No edema.     Left lower leg: No edema.  Skin:    General: Skin is warm and dry.     Capillary Refill: Capillary refill takes 2 to 3 seconds.  Neurological:     General: No focal deficit present.     Mental Status: She is alert and oriented to person, place, and time.   PERRLA.  EOMI.  Patient is able to give this examiner thumbs up with both hands and move both feet on command.  ____________________________________________   LABS (all labs ordered are listed, but only abnormal results are displayed)  Labs Reviewed  CBC WITH DIFFERENTIAL/PLATELET - Abnormal; Notable for the following components:      Result Value   Platelets 134 (*)    All other components within normal limits  COMPREHENSIVE METABOLIC PANEL - Abnormal; Notable for the following components:   Potassium 2.8 (*)    Glucose, Bld 138 (*)    All other components within normal limits  GLUCOSE, CAPILLARY - Abnormal; Notable for the following components:   Glucose-Capillary 127 (*)    All other components within normal limits  TSH - Abnormal;  Notable for the following components:   TSH 0.331 (*)    All other components within normal limits  MAGNESIUM  BRAIN NATRIURETIC PEPTIDE  URINALYSIS, COMPLETE (UACMP) WITH MICROSCOPIC  PROCALCITONIN  POTASSIUM  CBC  CREATININE, SERUM  ABO/RH  TROPONIN I (HIGH SENSITIVITY)  TROPONIN I (HIGH SENSITIVITY)   ____________________________________________  EKG  Sinus rhythm with a ventricular rate of 60, left bundle branch block, unremarkable intervals and no evidence of acute ischemia or other significant underlying arrhythmia. ____________________________________________  RADIOLOGY  ED MD interpretation:.  Bronchial thickening.  No focal consolidation, pneumonia, or pneumothorax.  Will order CTA to follow-up radiology findings of triangular object which on my exam does appear abnormal in the right upper lobe.  Official radiology report(s): DG Chest 1 View  Result Date: 07/09/2020 CLINICAL DATA:  Syncope.  COVID-19 positive. EXAM: CHEST  1 VIEW COMPARISON:  Chest x-ray dated February 03, 2013. FINDINGS: Normal heart size. Right paratracheal triangular density. Mild interstitial thickening at the lung bases. No focal consolidation, pleural effusion, or pneumothorax. No acute osseous abnormality. IMPRESSION: 1. Mild bibasilar interstitial thickening, which could reflect viral pneumonia given clinical history. 2. Right paratracheal triangular density. Chest CT with contrast is recommended for further evaluation. Electronically Signed   By: Titus Dubin M.D.   On: 07/09/2020 13:33    ____________________________________________   PROCEDURES  Procedure(s) performed (including Critical Care):  .1-3 Lead EKG Interpretation Performed by: Lucrezia Starch, MD Authorized by: Lucrezia Starch, MD     Interpretation: normal     ECG rate assessment: normal     Rhythm: sinus rhythm     Ectopy: none       ____________________________________________   INITIAL IMPRESSION / ASSESSMENT  AND PLAN / ED COURSE  Patient presents with above to history exam for assessment after syncopal episode in the setting of COVID-19 infection and several days of nausea vomiting diarrhea.  Patient is afebrile hemodynamic stable although borderline somnolent on arrival.  She is able to follow some commands with significant prompting.  She is noted to have stable vital signs on 2 L of oxygen as her SPO2 was 90% on room air on arrival.  Overall patient's presentation is work-up is concerning for syncope likely secondary to dehydration from GI losses.  EKG and troponin are not consistent with ACS or clear arrhythmia.  Patient does not appear anemic.  Aside from hypokalemia which was repleted there are no significant electrolyte or metabolic derangements identified.  Patient does not appear volume overloaded.  In addition to further evaluating triangular opacity seen on chest x-ray CTA will assess for PE.    While in the emergency room undergoing evaluation patient was noted by charge nurse to become asystolic on the monitor.  No pulse check was performed but charge nurse to perform 10 seconds of CPR with patient waking up return of normal vital signs.  Post compression ECG with a rate of 55 but otherwise no significant changes.  I discussed this with on-call cardiologist who recommended leg repletion as well as electrolyte repletion and monitoring on telemetry without any other acute interventions at this time.  We will plan to admit to hospital service for further evaluation management.  Medications  remdesivir 200 mg in sodium chloride 0.9% 250 mL IVPB (200 mg Intravenous New Bag/Given 07/09/20 1458)    Followed by  remdesivir 100 mg in sodium chloride 0.9 % 100 mL IVPB (has no administration in time range)  amLODipine (NORVASC) tablet 2.5 mg (2.5 mg Oral Not Given 07/09/20 1447)  rosuvastatin (CRESTOR) tablet 10 mg (10 mg Oral Not Given 07/09/20 1501)  niacin (NIASPAN) CR tablet 500 mg (has no  administration in time range)  levothyroxine (SYNTHROID) tablet 100 mcg (100 mcg Oral Not Given 07/09/20 1447)  pantoprazole (PROTONIX) EC tablet 40 mg (40 mg Oral Not Given 07/09/20 1501)  Glucosamine HCl TABS 1,000 mg (1,000 mg Oral Not Given 07/09/20 1503)  ascorbic acid (VITAMIN C) tablet 500 mg (has no administration in time range)  enoxaparin (LOVENOX) injection 40 mg (has no administration in time range)  0.9 % NaCl with KCl 40 mEq / L  infusion (has no administration in time range)  ipratropium (ATROVENT HFA) inhaler 2 puff (has no administration in time range)  senna (SENOKOT) tablet 8.6 mg (8.6 mg Oral Not Given 07/09/20 1500)  ondansetron (ZOFRAN) tablet 4 mg (has no administration in time range)    Or  ondansetron (ZOFRAN) injection 4 mg (has no administration in time range)  potassium chloride 10 mEq in 100 mL IVPB (has no administration in time range)  dexamethasone (DECADRON) injection 6 mg (6 mg Intravenous Given 07/09/20 1401)            ____________________________________________   FINAL CLINICAL IMPRESSION(S) / ED DIAGNOSES  Final diagnoses:  Syncope and collapse  COVID-19  Nausea vomiting and diarrhea  Hypokalemia    Medications  remdesivir 200 mg in sodium chloride 0.9% 250 mL IVPB (200 mg Intravenous New Bag/Given 07/09/20 1458)    Followed by  remdesivir 100 mg in sodium chloride 0.9 % 100 mL IVPB (has no administration in time range)  amLODipine (NORVASC) tablet 2.5 mg (2.5 mg Oral Not Given 07/09/20 1447)  rosuvastatin (CRESTOR) tablet 10 mg (10 mg Oral Not Given  07/09/20 1501)  niacin (NIASPAN) CR tablet 500 mg (has no administration in time range)  levothyroxine (SYNTHROID) tablet 100 mcg (100 mcg Oral Not Given 07/09/20 1447)  pantoprazole (PROTONIX) EC tablet 40 mg (40 mg Oral Not Given 07/09/20 1501)  Glucosamine HCl TABS 1,000 mg (1,000 mg Oral Not Given 07/09/20 1503)  ascorbic acid (VITAMIN C) tablet 500 mg (has no administration in time range)   enoxaparin (LOVENOX) injection 40 mg (has no administration in time range)  0.9 % NaCl with KCl 40 mEq / L  infusion (has no administration in time range)  ipratropium (ATROVENT HFA) inhaler 2 puff (has no administration in time range)  senna (SENOKOT) tablet 8.6 mg (8.6 mg Oral Not Given 07/09/20 1500)  ondansetron (ZOFRAN) tablet 4 mg (has no administration in time range)    Or  ondansetron (ZOFRAN) injection 4 mg (has no administration in time range)  potassium chloride 10 mEq in 100 mL IVPB (has no administration in time range)  dexamethasone (DECADRON) injection 6 mg (6 mg Intravenous Given 07/09/20 1401)     ED Discharge Orders    None       Note:  This document was prepared using Dragon voice recognition software and may include unintentional dictation errors.   Lucrezia Starch, MD 07/09/20 [phone]

## 2020-07-09 NOTE — ED Notes (Signed)
This RN walked by pt's room and noticed  Pt's sister at bedside, started conversation regarding pt being covid + and making sure sister was aware who stated she was, pt's monitor started to alarm and 0 was noted on the monitor and bp 89/76, leads checked to make sure of good connection, pt had just told sister she felt like she was going to pass out and not responding to this rn, pt started making gurgling sound and not responding, this RN yelled for help and compressions started. After approx 10 sec of cpr, pt started to blink and grimace and color returned to pt's face, dr Creig Hines at bedside

## 2020-07-09 NOTE — ED Triage Notes (Signed)
Pt arrives via ems from home, pt reports vomiting and passed out today. Pt states that she has been feeling bad for the past week and was diagnosed with covid a week ago. Reports that she didn't receive the vaccines

## 2020-07-09 NOTE — Progress Notes (Signed)
PHARMACIST - PHYSICIAN ORDER COMMUNICATION  CONCERNING: P&T Medication Policy on Herbal Medications  DESCRIPTION:  This patient's order for: Glucosamine  has been noted.  This product(s) is classified as an "herbal" or natural product. Due to a lack of definitive safety studies or FDA approval, nonstandard manufacturing practices, plus the potential risk of unknown drug-drug interactions while on inpatient medications, the Pharmacy and Therapeutics Committee does not permit the use of "herbal" or natural products of this type within [place].   ACTION TAKEN: The pharmacy department is unable to verify this order at this time and your patient has been informed of this safety policy. Please reevaluate patient's clinical condition at discharge and address if the herbal or natural product(s) should be resumed at that time.  

## 2020-07-09 NOTE — Progress Notes (Addendum)
PHARMACY CONSULT NOTE - FOLLOW UP  Pharmacy Consult for Electrolyte Monitoring and Replacement   Recent Labs: Potassium (mmol/L)  Date Value  07/09/2020 2.8 (L)  11/15/2014 3.9   Magnesium (mg/dL)  Date Value  07/09/2020 2.1   Calcium (mg/dL)  Date Value  07/09/2020 9.3   Calcium, Total (mg/dL)  Date Value  11/15/2014 8.8   Albumin (g/dL)  Date Value  07/09/2020 3.9  11/15/2014 3.6   Sodium (mmol/L)  Date Value  07/09/2020 141  11/15/2014 141   Assessment: 79 yo female presented to ED with witnessed syncopal episode.  Patient has has N/V/D for several days. She tested positive for COVID-19 on 8/27. Potassium 2.8 on admission.    Goal of Therapy:  Electrolytes WNL  Plan:  KCL 78mEq IV 6 ordered by provider. Patient is also receiving NaCl w/KCL 27mEq/L @ 25ml/hr.   Will order F/U potassium level 1 hour after infusions are complete.   Pharmacy will continue to follow and replace as needed.   Pernell Dupre, PharmD, BCPS Clinical Pharmacist 07/09/2020 1:27 PM

## 2020-07-09 NOTE — Progress Notes (Signed)
PHARMACY CONSULT NOTE - FOLLOW UP  Pharmacy Consult for Electrolyte Monitoring and Replacement   Recent Labs: Potassium (mmol/L)  Date Value  07/09/2020 3.5  11/15/2014 3.9   Magnesium (mg/dL)  Date Value  07/09/2020 2.1   Calcium (mg/dL)  Date Value  07/09/2020 9.3   Calcium, Total (mg/dL)  Date Value  11/15/2014 8.8   Albumin (g/dL)  Date Value  07/09/2020 3.9  11/15/2014 3.6   Sodium (mmol/L)  Date Value  07/09/2020 141  11/15/2014 141   Assessment: 79 yo female presented to ED with witnessed syncopal episode.  Patient has has N/V/D for several days. She tested positive for COVID-19 on 8/27. Potassium 2.8 on admission.    Goal of Therapy:  Electrolytes WNL  Plan:  KCL 99mEq IV 6 ordered by provider. Patient is also receiving NaCl w/KCL 38mEq/L @ 26ml/hr.   Will order F/U potassium level 1 hour after infusions are complete.   8/29:  K @ 1900 = 3.5 No additional K supplementation needed at this time.  Will recheck electrolytes on 8/30 with am labs.   Pharmacy will continue to follow and replace as needed.   Orene Desanctis, PharmD Clinical Pharmacist 07/09/2020 9:18 PM

## 2020-07-10 ENCOUNTER — Inpatient Hospital Stay (HOSPITAL_COMMUNITY)
Admit: 2020-07-10 | Discharge: 2020-07-10 | Disposition: A | Discharge status: Home / Self Care | Payer: Medicare HMO | Attending: Internal Medicine | Admitting: Internal Medicine

## 2020-07-10 DIAGNOSIS — I447 Left bundle-branch block, unspecified: Secondary | ICD-10-CM

## 2020-07-10 DIAGNOSIS — I340 Nonrheumatic mitral (valve) insufficiency: Secondary | ICD-10-CM

## 2020-07-10 LAB — ECHOCARDIOGRAM COMPLETE
AR max vel: 2.47 cm2
AV Area VTI: 2.58 cm2
AV Area mean vel: 2.47 cm2
AV Mean grad: 6.5 mmHg
AV Peak grad: 12.1 mmHg
Ao pk vel: 1.74 m/s
Area-P 1/2: 2.83 cm2
Height: 60 in
S' Lateral: 1.9 cm
Weight: 2640 oz

## 2020-07-10 LAB — COMPREHENSIVE METABOLIC PANEL
ALT: 22 U/L (ref 0–44)
AST: 27 U/L (ref 15–41)
Albumin: 3.6 g/dL (ref 3.5–5.0)
Alkaline Phosphatase: 54 U/L (ref 38–126)
Anion gap: 5 (ref 5–15)
BUN: 15 mg/dL (ref 8–23)
CO2: 28 mmol/L (ref 22–32)
Calcium: 8.5 mg/dL — ABNORMAL LOW (ref 8.9–10.3)
Chloride: 109 mmol/L (ref 98–111)
Creatinine, Ser: 0.76 mg/dL (ref 0.44–1.00)
GFR calc Af Amer: >60 mL/min (ref >60)
GFR calc non Af Amer: >60 mL/min (ref >60)
Glucose, Bld: 114 mg/dL — ABNORMAL HIGH (ref 70–99)
Potassium: 4.6 mmol/L (ref 3.5–5.1)
Sodium: 142 mmol/L (ref 135–145)
Total Bilirubin: 0.6 mg/dL (ref 0.3–1.2)
Total Protein: 6.3 g/dL — ABNORMAL LOW (ref 6.5–8.1)

## 2020-07-10 LAB — C-REACTIVE PROTEIN: CRP: 0.6 mg/dL (ref <1.0)

## 2020-07-10 LAB — PROCALCITONIN: Procalcitonin: <0.1 ng/mL

## 2020-07-10 LAB — FIBRIN DERIVATIVES D-DIMER (ARMC ONLY): Fibrin derivatives D-dimer (ARMC): 369.01 ng/mL (FEU) (ref 0.00–499.00)

## 2020-07-10 MED ORDER — DIPHENHYDRAMINE HCL 25 MG PO CAPS
25.0000 mg | ORAL_CAPSULE | Freq: Every evening | ORAL | Status: DC | PRN
  Administered 2020-07-10: 25 mg via ORAL
  Filled 2020-07-10: qty 1

## 2020-07-10 MED ORDER — ORAL CARE MOUTH RINSE
15.0000 mL | Freq: Two times a day (BID) | OROMUCOSAL | Status: DC
  Administered 2020-07-10 – 2020-07-11 (×3): 15 mL via OROMUCOSAL

## 2020-07-10 MED ORDER — RISAQUAD PO CAPS
1.0000 | ORAL_CAPSULE | Freq: Every day | ORAL | Status: DC
  Administered 2020-07-10 – 2020-07-11 (×2): 1 via ORAL
  Filled 2020-07-10 (×2): qty 1

## 2020-07-10 MED ORDER — POTASSIUM CHLORIDE 10 MEQ/100ML IV SOLN
10.0000 meq | Freq: Once | INTRAVENOUS | Status: DC
  Administered 2020-07-10: 10 meq via INTRAVENOUS

## 2020-07-10 MED ORDER — BUSPIRONE HCL 5 MG PO TABS
5.0000 mg | ORAL_TABLET | Freq: Three times a day (TID) | ORAL | Status: DC
  Administered 2020-07-10 – 2020-07-11 (×4): 5 mg via ORAL
  Filled 2020-07-10 (×6): qty 1

## 2020-07-10 MED FILL — Medication: Qty: 1 | Status: AC

## 2020-07-10 NOTE — Progress Notes (Signed)
PHARMACY CONSULT NOTE - FOLLOW UP  Pharmacy Consult for Electrolyte Monitoring and Replacement   Recent Labs: Potassium (mmol/L)  Date Value  07/10/2020 4.6  11/15/2014 3.9   Magnesium (mg/dL)  Date Value  07/09/2020 2.1   Calcium (mg/dL)  Date Value  07/10/2020 8.5 (L)   Calcium, Total (mg/dL)  Date Value  11/15/2014 8.8   Albumin (g/dL)  Date Value  07/10/2020 3.6  11/15/2014 3.6   Sodium (mmol/L)  Date Value  07/10/2020 142  11/15/2014 141   Assessment: 79 yo female presented to ED with witnessed syncopal episode.  Patient has has N/V/D for several days. She tested positive for COVID-19 on 8/27. Potassium 2.8 on admission.    Goal of Therapy:  Electrolytes WNL  Plan:  Electrolytes wnl's with am labs, IVF's with KCl dc'ed   Pharmacy will continue to follow and replace as needed.   Lu Duffel, PharmD Clinical Pharmacist 07/10/2020 8:05 AM

## 2020-07-10 NOTE — Consult Note (Signed)
Cardiology Consultation:   Patient ID: Cathy Coffey MRN: [id number]; DOB: 07/06/1941  Admit date: 07/09/2020 Date of Consult: 07/10/2020  Primary Care Provider: Leone Haven, MD [facility] Cardiologist: Dr. Rockey Situ ( ordered stress test in 2019) [place] Electrophysiologist:  None    Patient Profile:   Cathy Coffey is a 79 y.o. female with a hx of left bundle branch block, essential hypertension, hypothyroidism and mild cognitive decline who is being seen today for the evaluation of possible syncope at the request of Dr. Posey Pronto.  History of Present Illness:   Cathy Coffey is a 79 year old female with above medical problems.  She has known history of left bundle branch block and due to that she underwent a Lexiscan Myoview in 2019 which showed no evidence of ischemia with normal ejection fraction.  She tested positive for COVID-19 on August 27.  Her symptoms included vomiting and syncope at home and she has been feeling poorly.  The patient is unvaccinated.  She came to the emergency room yesterday with relatively stable vital signs and heart rate.  She had an episode of decreased responsiveness in the ED likely with pause on the monitor as the heart rate read as zero.  CPR was done for 10 seconds and the patient woke up after that. EKG showed left bundle branch block which is chronic.  Telemetry shows sinus rhythm with intermittent sinus tachycardia and sinus bradycardia.  High-sensitivity troponin is normal.   Past Medical History:  Diagnosis Date  . Asthma   . Diverticulosis   . Hypertension   . Hypothyroidism   . IBS (irritable bowel syndrome)   . Lichen planus   . Pancreatitis due to common bile duct stone 2000    Past Surgical History:  Procedure Laterality Date  . CHOLECYSTECTOMY    . COLONOSCOPY  2011  . HERNIA REPAIR    . MOUTH SURGERY  09/2016  . TUBAL LIGATION       Home Medications:  Prior to Admission medications   Medication Sig Start Date  End Date Taking? Authorizing Provider  alendronate (FOSAMAX) 70 MG tablet TAKE 1 TABLET EVERY 7 DAYS.TAKE WITH A FULL GLASS OF  WATER ON AN EMPTY STOMACH. Patient taking differently: Take 70 mg by mouth once a week. TAKE 1 TABLET EVERY 7 DAYS.TAKE WITH A FULL GLASS OF  WATER ON AN EMPTY STOMACH. 02/07/20  Yes Leone Haven, MD  amLODipine (NORVASC) 2.5 MG tablet Take 1 tablet (2.5 mg total) by mouth daily. 10/22/19  Yes Leone Haven, MD  Ascorbic Acid (VITAMIN C) 100 MG tablet Take 100 mg by mouth daily.   Yes [provider]  B Complex-C (SUPER B COMPLEX PO) Take 1 capsule by mouth.   Yes [provider]  BIOTIN 5000 PO Take by mouth.   Yes [provider]  caffeine 200 MG TABS tablet Take 200 mg by mouth daily.   Yes [provider]  Calcium Carbonate-Vitamin D (CALCIUM-VITAMIN D) 500-200 MG-UNIT per tablet Take 3 tablets by mouth daily.    Yes [provider]  desonide (DESOWEN) 0.05 % cream Apply topically 2 (two) times daily. 06/03/16  Yes Jackolyn Confer, MD  DIGESTIVE AIDS MIXTURE PO Take by mouth.   Yes [provider]  fluocinonide gel (LIDEX) 0.05 %  04/07/19  Yes [provider]  Glucosamine HCl 1000 MG TABS Take 1 tablet by mouth.   Yes [provider]  levothyroxine (SYNTHROID) 100 MCG tablet Take 1 tablet (  100 mcg total) by mouth daily. 10/22/19  Yes Leone Haven, MD  Magnesium 250 MG TABS Take 500 mg by mouth.    Yes [provider]  Misc Natural Products (ADVANCED JOINT RELIEF PO) Take by mouth.   Yes [provider]  Multiple Vitamins-Minerals (PRESERVISION AREDS PO) Take 1 tablet by mouth daily.    Yes [provider]  NIACIN, ANTIHYPERLIPIDEMIC, PO Take 500 mg by mouth.   Yes [provider]  Omega-3 Fatty Acids (FISH OIL) 1000 MG CPDR Take 3,000 mg by mouth daily.    Yes [provider]  pantoprazole (PROTONIX) 40 MG tablet TAKE 1 TABLET TWICE  DAILY Patient taking differently: Take 40 mg by mouth 2 (two) times daily.  05/22/20  Yes Leone Haven, MD  POTASSIUM PO Take 600 mg by mouth.   Yes [provider]  Probiotic Product (TRUBIOTICS PO) Take 1 capsule by mouth.   Yes [provider]  rosuvastatin (CRESTOR) 10 MG tablet Take 1 tablet (10 mg total) by mouth daily. 12/23/19  Yes Leone Haven, MD  vitamin A 10000 UNIT capsule Take 10,000 Units by mouth daily.   Yes [provider]  Alum & Mag Hydroxide-Simeth (ANTACID ANTI-GAS PO) Take by mouth.    [provider]  furosemide (LASIX) 20 MG tablet Take 1 tablet (20 mg total) by mouth daily. 10/22/19   Leone Haven, MD    Inpatient Medications: Scheduled Meds: . acidophilus  1 capsule Oral Daily  . amLODipine  2.5 mg Oral Daily  . ascorbic acid  500 mg Oral Daily  . busPIRone  5 mg Oral TID  . enoxaparin (LOVENOX) injection  40 mg Subcutaneous Q24H  . ipratropium  2 puff Inhalation Q6H  . levothyroxine  100 mcg Oral Q0600  . mouth rinse  15 mL Mouth Rinse BID  . niacin  500 mg Oral QHS  . pantoprazole  40 mg Oral BID  . rosuvastatin  10 mg Oral Daily  . senna  1 tablet Oral BID   Continuous Infusions: . remdesivir 100 mg in NS 100 mL 100 mg (07/10/20 0902)   PRN Meds: acetaminophen, diphenhydrAMINE, ondansetron **OR** ondansetron (ZOFRAN) IV  Allergies:    Allergies  Allergen Reactions  . Augmentin [Amoxicillin-Pot Clavulanate] Itching    Hives     Social History:   Social History   Socioeconomic History  . Marital status: Divorced    Spouse name: Not on file  . Number of children: Not on file  . Years of education: Not on file  . Highest education level: Not on file  Occupational History  . Not on file  Tobacco Use  . Smoking status: Never Smoker  . Smokeless tobacco: Never Used  Substance and Sexual Activity  . Alcohol use: No  . Drug use: No  . Sexual activity: Never  Other Topics Concern  . Not on  file  Social History Narrative  . Not on file   Social Determinants of Health   Financial Resource Strain:   . Difficulty of Paying Living Expenses: Not on file  Food Insecurity:   . Worried About Charity fundraiser in the Last Year: Not on file  . Ran Out of Food in the Last Year: Not on file  Transportation Needs:   . Lack of Transportation (Medical): Not on file  . Lack of Transportation (Non-Medical): Not on file  Physical Activity:   . Days of Exercise per Week: Not on file  .  Minutes of Exercise per Session: Not on file  Stress:   . Feeling of Stress : Not on file  Social Connections:   . Frequency of Communication with Friends and Family: Not on file  . Frequency of Social Gatherings with Friends and Family: Not on file  . Attends Religious Services: Not on file  . Active Member of Clubs or Organizations: Not on file  . Attends Archivist Meetings: Not on file  . Marital Status: Not on file  Intimate Partner Violence:   . Fear of Current or Ex-Partner: Not on file  . Emotionally Abused: Not on file  . Physically Abused: Not on file  . Sexually Abused: Not on file    Family History:    Family History  Problem Relation Age of Onset  . Hypothyroidism Mother   . Hypertension Mother   . Dementia Father   . Dementia Paternal Grandmother      ROS:  Please see the history of present illness.   All other ROS reviewed and negative.     Physical Exam/Data:   Vitals:   07/10/20 0445 07/10/20 0800 07/10/20 1600 07/10/20 1933  BP: 135/61 134/75 (!) 126/51 138/71  Pulse: 68 73 85 73  Resp: 16 18 17 17   Temp: 98.3 F (36.8 C) 98 F (36.7 C) 98.1 F (36.7 C) 98 F (36.7 C)  TempSrc: Oral Oral Oral   SpO2: 99% 100% 100% 97%  Weight:      Height:        Intake/Output Summary (Last 24 hours) at 07/10/2020 1945 Last data filed at 07/10/2020 5621 Gross per 24 hour  Intake 1205.37 ml  Output 500 ml  Net 705.37 ml   Last 3 Weights 07/09/2020 10/22/2019  07/21/2019  Weight (lbs) 165 lb 185 lb 182 lb  Weight (kg) 74.844 kg 83.915 kg 82.555 kg     Body mass index is 32.22 kg/m.  I did not examine the patient due to COVID-19 infection.  This was a remote consult.  EKG:  The EKG was personally reviewed and demonstrates: Sinus rhythm with left bundle branch block   Relevant CV Studies: Echocardiogram was done today and was personally reviewed by me.  It showed normal LV systolic function.  Right ventricular function was normal.  There was mild to moderate mitral regurgitation and mildly calcified aortic valve without significant stenosis.  Laboratory Data:  High Sensitivity Troponin:   Recent Labs  Lab 07/09/20 1217 07/09/20 1902  TROPONINIHS 6 13     Chemistry Recent Labs  Lab 07/09/20 1217 07/09/20 1902 07/10/20 0556  NA 141  --  142  K 2.8* 3.5 4.6  CL 104  --  109  CO2 24  --  28  GLUCOSE 138*  --  114*  BUN 18  --  15  CREATININE 0.81 0.67 0.76  CALCIUM 9.3  --  8.5*  GFRNONAA >60 >60 >60  GFRAA >60 >60 >60  ANIONGAP 13  --  5    Recent Labs  Lab 07/09/20 1217 07/10/20 0556  PROT 7.0 6.3*  ALBUMIN 3.9 3.6  AST 30 27  ALT 24 22  ALKPHOS 62 54  BILITOT 0.6 0.6   Hematology Recent Labs  Lab 07/09/20 1217 07/09/20 1902  WBC 4.1 3.4*  RBC 4.64 4.60  HGB 13.1 13.2  HCT 40.2 40.3  MCV 86.6 87.6  MCH 28.2 28.7  MCHC 32.6 32.8  RDW 13.7 13.6  PLT 134* 134*   BNP Recent Labs  Lab 07/09/20 1217  BNP 22.0    DDimer No results for input(s): DDIMER in the last 168 hours.   Radiology/Studies:  DG Chest 1 View  Result Date: 07/09/2020 CLINICAL DATA:  Syncope.  COVID-19 positive. EXAM: CHEST  1 VIEW COMPARISON:  Chest x-ray dated February 03, 2013. FINDINGS: Normal heart size. Right paratracheal triangular density. Mild interstitial thickening at the lung bases. No focal consolidation, pleural effusion, or pneumothorax. No acute osseous abnormality. IMPRESSION: 1. Mild bibasilar interstitial thickening, which  could reflect viral pneumonia given clinical history. 2. Right paratracheal triangular density. Chest CT with contrast is recommended for further evaluation. Electronically Signed   By: Titus Dubin M.D.   On: 07/09/2020 13:33   CT Angio Chest PE W and/or Wo Contrast  Result Date: 07/09/2020 CLINICAL DATA:  Syncopal episode, cough, myalgias, fatigue, nausea and vomiting, generalized weakness diagnosed with COVID-19 on 07/07/2020, high clinical suspicion of pulmonary embolism, history asthma, hypertension, GERD EXAM: CT ANGIOGRAPHY CHEST WITH CONTRAST TECHNIQUE: Multidetector CT imaging of the chest was performed using the standard protocol during bolus administration of intravenous contrast. Multiplanar CT image reconstructions and MIPs were obtained to evaluate the vascular anatomy. CONTRAST:  75mL OMNIPAQUE IOHEXOL 350 MG/ML SOLN IV COMPARISON:  None FINDINGS: Cardiovascular: Atherosclerotic calcification aorta and proximal great vessels. Aorta normal caliber without aneurysm or dissection. Heart unremarkable. Minimal pericardial fluid. Pulmonary arteries adequately opacified and patent. No evidence of pulmonary embolism. Mediastinum/Nodes: Esophagus normal appearance. Base of cervical region unremarkable. No thoracic adenopathy. Lungs/Pleura: Minimal dependent density at lung bases. Lungs otherwise clear. No pulmonary infiltrate, pleural effusion, or pneumothorax. Upper Abdomen: Normal appearance Musculoskeletal: Scattered degenerative disc disease changes of cervicothoracic spine. Review of the MIP images confirms the above findings. IMPRESSION: No evidence of pulmonary embolism. No acute intrathoracic abnormalities. Aortic Atherosclerosis (ICD10-I70.0). Electronically Signed   By: Lavonia Dana M.D.   On: 07/09/2020 16:45   ECHOCARDIOGRAM COMPLETE  Result Date: 07/10/2020    ECHOCARDIOGRAM REPORT   Patient Name:   Cathy Coffey Date of Exam: 07/10/2020 Medical Rec #:  [id number]       Height:        60.0 in Accession #:    [id number]      Weight:       165.0 lb Date of Birth:  [birth year]       BSA:          1.720 m Patient Age:    78 years        BP:           134/75 mmHg Patient Gender: F               HR:           73 bpm. Exam Location:  [facility] Procedure: 2D Echo, Cardiac Doppler and Color Doppler Indications:     Abnormal heart sounds 785.3  History:         Patient has no prior history of Echocardiogram examinations.                  Risk Factors:Hypertension.  Sonographer:     Sherrie Sport RDCS (AE) Referring Phys:  [id number] SONA PATEL Diagnosing Phys: Kathlyn Sacramento MD  Sonographer Comments: Suboptimal parasternal window. IMPRESSIONS  1. Left ventricular ejection fraction, by estimation, is 55 to 60%. The left ventricle has normal function. Left ventricular endocardial border not optimally defined to evaluate regional wall motion. There is mild left ventricular hypertrophy. Left ventricular diastolic parameters are consistent with Grade  II diastolic dysfunction (pseudonormalization).  2. Right ventricular systolic function is normal. The right ventricular size is normal. Tricuspid regurgitation signal is inadequate for assessing PA pressure.  3. Left atrial size was mildly dilated.  4. The mitral valve is normal in structure. Mild to moderate mitral valve regurgitation. No evidence of mitral stenosis.  5. The aortic valve is normal in structure. Aortic valve regurgitation is not visualized. Mild aortic valve sclerosis is present, with no evidence of aortic valve stenosis. FINDINGS  Left Ventricle: Left ventricular ejection fraction, by estimation, is 55 to 60%. The left ventricle has normal function. Left ventricular endocardial border not optimally defined to evaluate regional wall motion. The left ventricular internal cavity size was normal in size. There is mild left ventricular hypertrophy. Left ventricular diastolic parameters are consistent with Grade II diastolic dysfunction (pseudonormalization). Right  Ventricle: The right ventricular size is normal. No increase in right ventricular wall thickness. Right ventricular systolic function is normal. Tricuspid regurgitation signal is inadequate for assessing PA pressure. The tricuspid regurgitant velocity is 2.44 m/s, and with an assumed right atrial pressure of 5 mmHg, the estimated right ventricular systolic pressure is 17.6 mmHg. Left Atrium: Left atrial size was mildly dilated. Right Atrium: Right atrial size was normal in size. Pericardium: There is no evidence of pericardial effusion. Mitral Valve: The mitral valve is normal in structure. Normal mobility of the mitral valve leaflets. Mild to moderate mitral valve regurgitation. No evidence of mitral valve stenosis. Tricuspid Valve: The tricuspid valve is normal in structure. Tricuspid valve regurgitation is trivial. No evidence of tricuspid stenosis. Aortic Valve: The aortic valve is normal in structure. Aortic valve regurgitation is not visualized. Mild aortic valve sclerosis is present, with no evidence of aortic valve stenosis. Aortic valve mean gradient measures 6.5 mmHg. Aortic valve peak gradient measures 12.1 mmHg. Aortic valve area, by VTI measures 2.58 cm. Pulmonic Valve: The pulmonic valve was normal in structure. Pulmonic valve regurgitation is not visualized. No evidence of pulmonic stenosis. Aorta: The aortic root is normal in size and structure. Venous: The inferior vena cava was not well visualized. IAS/Shunts: No atrial level shunt detected by color flow Doppler.  LEFT VENTRICLE PLAX 2D LVIDd:         2.79 cm  Diastology LVIDs:         1.90 cm  LV e' lateral:   4.24 cm/s LV PW:         1.28 cm  LV E/e' lateral: 21.1 LV IVS:        0.91 cm  LV e' medial:    2.94 cm/s LVOT diam:     2.00 cm  LV E/e' medial:  30.4 LV SV:         84 LV SV Index:   49 LVOT Area:     3.14 cm  RIGHT VENTRICLE RV Basal diam:  2.06 cm LEFT ATRIUM           Index       RIGHT ATRIUM          Index LA diam:      4.10 cm 2.38  cm/m  RA Area:     8.99 cm LA Vol (A4C): 41.9 ml 24.36 ml/m RA Volume:   16.60 ml 9.65 ml/m  AORTIC VALVE                    PULMONIC VALVE AV Area (Vmax):    2.47 cm     PV Vmax:  0.67 m/s AV Area (Vmean):   2.47 cm     PV Peak grad:   1.8 mmHg AV Area (VTI):     2.58 cm     RVOT Peak grad: 4 mmHg AV Vmax:           174.00 cm/s AV Vmean:          122.500 cm/s AV VTI:            0.324 m AV Peak Grad:      12.1 mmHg AV Mean Grad:      6.5 mmHg LVOT Vmax:         137.00 cm/s LVOT Vmean:        96.200 cm/s LVOT VTI:          0.266 m LVOT/AV VTI ratio: 0.82  AORTA Ao Root diam: 2.80 cm MITRAL VALVE                TRICUSPID VALVE MV Area (PHT): 2.83 cm     TR Peak grad:   23.8 mmHg MV Decel Time: 268 msec     TR Vmax:        244.00 cm/s MV E velocity: 89.50 cm/s MV A velocity: 100.00 cm/s  SHUNTS MV E/A ratio:  0.90         Systemic VTI:  0.27 m                             Systemic Diam: 2.00 cm Kathlyn Sacramento MD Electronically signed by Kathlyn Sacramento MD Signature Date/Time: 07/10/2020/12:37:40 PM    Final      Assessment and Plan:   1. Syncope: The patient's event is consistent with syncope and not cardiac arrest.  Suspect vasovagal in nature given nausea and GI symptoms in the setting of COVID-19.  However, she has underlying left bundle branch block and thus conduction abnormalities cannot be excluded.  Fortunately, her echocardiogram showed normal LV systolic function.  I recommend keeping the patient on telemetry while she is hospitalized.  If no arrhythmia is detected, recommend a 2-week outpatient monitor to exclude that possibility. 2. COVID-19 infection: Continue supportive care .  The patient should consider vaccination once she recovers. 3. Left bundle branch block: This is chronic.  EF is normal.      For questions or updates, please contact [place] Please consult [email or web address] for contact info under    Signed, Kathlyn Sacramento, MD  07/10/2020 7:45 PM

## 2020-07-10 NOTE — Progress Notes (Signed)
*  PRELIMINARY RESULTS* Echocardiogram 2D Echocardiogram has been performed.  Sherrie Sport 07/10/2020, 8:57 AM

## 2020-07-10 NOTE — Progress Notes (Signed)
[facility] Progress Note  Patient: Cathy Coffey    DPO:[id number]  DOA: 07/09/2020     Date of Service: the patient was seen and examined on 07/10/2020  Brief hospital course: Cathy Coffey  is a 79 y.o. female with a known history of hypertension, hypothyroidism, asthma, mild cognitive decline follows with Dr. Manuella Ghazi neurology was tested positive for COVID 19 on 07/07/2020 when she went to urgent care. Patient came in with symptoms of vomiting and passing out spell at home. Currently plan is continue current care..  Assessment and Plan: 1. syncope with transient CPR in the ER Chronic LBBB. Cardiology consulted. Echocardiogram shows preserved EF with diastolic dysfunction without any valvular abnormality. Telemetry negative so far. No symptoms.  No focal deficit. Patient will benefit from 30-day cardiac monitor.  2. Hypokalemia Corrected. Monitor.  3. Pneumonia due to COVID-19 Mild hypoxia currently resolved. -IV Remdesivir -incentive spirometer, bronchodilator as needed -patient saturations 99% on room air. Oxygen as needed -no indication for steroids Discussed with pharmacy regarding possible indication for multiple antibody.  4. Hypertension -patient is on amlodipine 2.5 mg daily-- can be started from tomorrow  5. Hypothyroidism -continue Synthroid  6. History of osteoporosis -continue calcium supplements and Fosamax  7. Mild cognitive decline -patient follows with neurology Dr. Manuella Ghazi as outpatient  8.  Obesity Body mass index is 32.22 kg/m.   Diet: Cardiac diet DVT Prophylaxis:   enoxaparin (LOVENOX) injection 40 mg Start: 07/09/20 1430 SCDs Start: 07/09/20 1428    Advance goals of care discussion: Full code  Family Communication: no family was present at bedside, at the time of interview.   Disposition:  Status is: Inpatient  Remains inpatient appropriate because:Inpatient level of care appropriate due to severity of illness   Dispo: The  patient is from: Home              Anticipated d/c is to: Home              Anticipated d/c date is: 1 day              Patient currently is not medically stable to d/c.  Subjective: No nausea no vomiting but no fever no chills.  No dizziness or lightheadedness.  No focal deficit.  Breathing okay as well.  Physical Exam:  General: Appear in mild distress, no Rash; Oral Mucosa Clear, moist. no Abnormal Neck Mass Or lumps, Conjunctiva normal  Cardiovascular: S1 and S2 Present, no Murmur, Respiratory: good respiratory effort, Bilateral Air entry present and Clear to Auscultation, no Crackles, no wheezes Abdomen: Bowel Sound present, Soft and no tenderness Extremities: no Pedal edema, no calf tenderness Neurology: alert and oriented to time, place, and person affect appropriate. no new focal deficit Gait not checked due to patient safety concerns  Vitals:   07/10/20 0445 07/10/20 0800 07/10/20 1600 07/10/20 1933  BP: 135/61 134/75 (!) 126/51 138/71  Pulse: 68 73 85 73  Resp: 16 18 17 17   Temp: 98.3 F (36.8 C) 98 F (36.7 C) 98.1 F (36.7 C) 98 F (36.7 C)  TempSrc: Oral Oral Oral   SpO2: 99% 100% 100% 97%  Weight:      Height:        Intake/Output Summary (Last 24 hours) at 07/10/2020 1942 Last data filed at 07/10/2020 4315 Gross per 24 hour  Intake 1205.37 ml  Output 500 ml  Net 705.37 ml   Filed Weights   07/09/20 1211  Weight: 74.8 kg    Data Reviewed: I  have personally reviewed and interpreted daily labs, tele strips, imagings as discussed above. I reviewed all nursing notes, pharmacy notes, vitals, pertinent old records I have discussed plan of care as described above with RN and patient/family.  CBC: Recent Labs  Lab 07/09/20 1217 07/09/20 1902  WBC 4.1 3.4*  NEUTROABS 2.4  --   HGB 13.1 13.2  HCT 40.2 40.3  MCV 86.6 87.6  PLT 134* 673*   Basic Metabolic Panel: Recent Labs  Lab 07/09/20 1217 07/09/20 1902 07/10/20 0556  NA 141  --  142  K 2.8*  3.5 4.6  CL 104  --  109  CO2 24  --  28  GLUCOSE 138*  --  114*  BUN 18  --  15  CREATININE 0.81 0.67 0.76  CALCIUM 9.3  --  8.5*  MG 2.1  --   --     Studies: ECHOCARDIOGRAM COMPLETE  Result Date: 07/10/2020    ECHOCARDIOGRAM REPORT   Patient Name:   Cathy Coffey Date of Exam: 07/10/2020 Medical Rec #:  [id number]       Height:       60.0 in Accession #:    [id number]      Weight:       165.0 lb Date of Birth:  [birth year]       BSA:          1.720 m Patient Age:    33 years        BP:           134/75 mmHg Patient Gender: F               HR:           73 bpm. Exam Location:  [facility] Procedure: 2D Echo, Cardiac Doppler and Color Doppler Indications:     Abnormal heart sounds 785.3  History:         Patient has no prior history of Echocardiogram examinations.                  Risk Factors:Hypertension.  Sonographer:     Sherrie Sport RDCS (AE) Referring Phys:  [id number] [id number] Ashlynn Gunnels Diagnosing Phys: Kathlyn Sacramento MD  Sonographer Comments: Suboptimal parasternal window. IMPRESSIONS  1. Left ventricular ejection fraction, by estimation, is 55 to 60%. The left ventricle has normal function. Left ventricular endocardial border not optimally defined to evaluate regional wall motion. There is mild left ventricular hypertrophy. Left ventricular diastolic parameters are consistent with Grade II diastolic dysfunction (pseudonormalization).  2. Right ventricular systolic function is normal. The right ventricular size is normal. Tricuspid regurgitation signal is inadequate for assessing PA pressure.  3. Left atrial size was mildly dilated.  4. The mitral valve is normal in structure. Mild to moderate mitral valve regurgitation. No evidence of mitral stenosis.  5. The aortic valve is normal in structure. Aortic valve regurgitation is not visualized. Mild aortic valve sclerosis is present, with no evidence of aortic valve stenosis. FINDINGS  Left Ventricle: Left ventricular ejection fraction, by estimation, is 55 to 60%. The  left ventricle has normal function. Left ventricular endocardial border not optimally defined to evaluate regional wall motion. The left ventricular internal cavity size was normal in size. There is mild left ventricular hypertrophy. Left ventricular diastolic parameters are consistent with Grade II diastolic dysfunction (pseudonormalization). Right Ventricle: The right ventricular size is normal. No increase in right ventricular wall thickness. Right ventricular systolic function is normal. Tricuspid regurgitation signal is inadequate for assessing  PA pressure. The tricuspid regurgitant velocity is 2.44 m/s, and with an assumed right atrial pressure of 5 mmHg, the estimated right ventricular systolic pressure is 50.9 mmHg. Left Atrium: Left atrial size was mildly dilated. Right Atrium: Right atrial size was normal in size. Pericardium: There is no evidence of pericardial effusion. Mitral Valve: The mitral valve is normal in structure. Normal mobility of the mitral valve leaflets. Mild to moderate mitral valve regurgitation. No evidence of mitral valve stenosis. Tricuspid Valve: The tricuspid valve is normal in structure. Tricuspid valve regurgitation is trivial. No evidence of tricuspid stenosis. Aortic Valve: The aortic valve is normal in structure. Aortic valve regurgitation is not visualized. Mild aortic valve sclerosis is present, with no evidence of aortic valve stenosis. Aortic valve mean gradient measures 6.5 mmHg. Aortic valve peak gradient measures 12.1 mmHg. Aortic valve area, by VTI measures 2.58 cm. Pulmonic Valve: The pulmonic valve was normal in structure. Pulmonic valve regurgitation is not visualized. No evidence of pulmonic stenosis. Aorta: The aortic root is normal in size and structure. Venous: The inferior vena cava was not well visualized. IAS/Shunts: No atrial level shunt detected by color flow Doppler.  LEFT VENTRICLE PLAX 2D LVIDd:         2.79 cm  Diastology LVIDs:         1.90 cm  LV e'  lateral:   4.24 cm/s LV PW:         1.28 cm  LV E/e' lateral: 21.1 LV IVS:        0.91 cm  LV e' medial:    2.94 cm/s LVOT diam:     2.00 cm  LV E/e' medial:  30.4 LV SV:         84 LV SV Index:   49 LVOT Area:     3.14 cm  RIGHT VENTRICLE RV Basal diam:  2.06 cm LEFT ATRIUM           Index       RIGHT ATRIUM          Index LA diam:      4.10 cm 2.38 cm/m  RA Area:     8.99 cm LA Vol (A4C): 41.9 ml 24.36 ml/m RA Volume:   16.60 ml 9.65 ml/m  AORTIC VALVE                    PULMONIC VALVE AV Area (Vmax):    2.47 cm     PV Vmax:        0.67 m/s AV Area (Vmean):   2.47 cm     PV Peak grad:   1.8 mmHg AV Area (VTI):     2.58 cm     RVOT Peak grad: 4 mmHg AV Vmax:           174.00 cm/s AV Vmean:          122.500 cm/s AV VTI:            0.324 m AV Peak Grad:      12.1 mmHg AV Mean Grad:      6.5 mmHg LVOT Vmax:         137.00 cm/s LVOT Vmean:        96.200 cm/s LVOT VTI:          0.266 m LVOT/AV VTI ratio: 0.82  AORTA Ao Root diam: 2.80 cm MITRAL VALVE                TRICUSPID VALVE MV  Area (PHT): 2.83 cm     TR Peak grad:   23.8 mmHg MV Decel Time: 268 msec     TR Vmax:        244.00 cm/s MV E velocity: 89.50 cm/s MV A velocity: 100.00 cm/s  SHUNTS MV E/A ratio:  0.90         Systemic VTI:  0.27 m                             Systemic Diam: 2.00 cm Kathlyn Sacramento MD Electronically signed by Kathlyn Sacramento MD Signature Date/Time: 07/10/2020/12:37:40 PM    Final     Scheduled Meds:  acidophilus  1 capsule Oral Daily   amLODipine  2.5 mg Oral Daily   ascorbic acid  500 mg Oral Daily   busPIRone  5 mg Oral TID   enoxaparin (LOVENOX) injection  40 mg Subcutaneous Q24H   ipratropium  2 puff Inhalation Q6H   levothyroxine  100 mcg Oral Q0600   mouth rinse  15 mL Mouth Rinse BID   niacin  500 mg Oral QHS   pantoprazole  40 mg Oral BID   rosuvastatin  10 mg Oral Daily   senna  1 tablet Oral BID   Continuous Infusions:  remdesivir 100 mg in NS 100 mL 100 mg (07/10/20 0902)   PRN Meds:  acetaminophen, diphenhydrAMINE, ondansetron **OR** ondansetron (ZOFRAN) IV  Time spent: 35 minutes  Author: Berle Mull, MD [facility] 07/10/2020 7:42 PM  To reach On-call, see care teams to locate the attending and reach out via [email or web address]. Between 7PM-7AM, please contact night-coverage If you still have difficulty reaching the attending provider, please page the [facility] (Director on Call) for [facility] on amion for assistance.

## 2020-07-11 ENCOUNTER — Telehealth: Payer: Self-pay | Admitting: Family Medicine

## 2020-07-11 LAB — BASIC METABOLIC PANEL
Anion gap: 5 (ref 5–15)
BUN: 21 mg/dL (ref 8–23)
CO2: 25 mmol/L (ref 22–32)
Calcium: 8.6 mg/dL — ABNORMAL LOW (ref 8.9–10.3)
Chloride: 111 mmol/L (ref 98–111)
Creatinine, Ser: 0.68 mg/dL (ref 0.44–1.00)
GFR calc Af Amer: >60 mL/min (ref >60)
GFR calc non Af Amer: >60 mL/min (ref >60)
Glucose, Bld: 91 mg/dL (ref 70–99)
Potassium: 3.8 mmol/L (ref 3.5–5.1)
Sodium: 141 mmol/L (ref 135–145)

## 2020-07-11 LAB — C-REACTIVE PROTEIN: CRP: 0.5 mg/dL (ref <1.0)

## 2020-07-11 LAB — FIBRIN DERIVATIVES D-DIMER (ARMC ONLY): Fibrin derivatives D-dimer (ARMC): 333.02 ng/mL (FEU) (ref 0.00–499.00)

## 2020-07-11 MED ORDER — POTASSIUM CHLORIDE CRYS ER 20 MEQ PO TBCR
20.0000 meq | EXTENDED_RELEASE_TABLET | Freq: Once | ORAL | Status: AC
  Administered 2020-07-11: 10:00:00 20 meq via ORAL
  Filled 2020-07-11: qty 1

## 2020-07-11 NOTE — Telephone Encounter (Signed)
Noted. Will follow.  

## 2020-07-11 NOTE — Plan of Care (Signed)
  Problem: Education: Goal: Knowledge of General Education information will improve Description Including pain rating scale, medication(s)/side effects and non-pharmacologic comfort measures Outcome: Progressing   

## 2020-07-11 NOTE — Evaluation (Signed)
Physical Therapy Evaluation Patient Details Name: Cathy Coffey MRN: [id number] DOB: 1941/06/11 Today's Date: 07/11/2020   History of Present Illness  79 y.o. female with a known history of hypertension, hypothyroidism, asthma, mild cognitive decline follows.  N/V and passing out spells at home, tested positive for Cov-19 8/27.  Clinical Impression  Pt did very well with PT exam this AM with no issues during prolonged ambulation (good speed, safety and confidence).  Pt on room air t/o the session, 99% pre ambulation and 94% after >200 ft.  Pt reports feeling that she is essentially at her baseline but just a little stiff from being in bed.  Pt eager to go home and does have family available should she need assist, son lives in apartment below (she lives with 54 y/o Teacher, early years/pre, also Covid +).  No further PT needs/follow-up; will sign off.    Follow Up Recommendations No PT follow up    Equipment Recommendations  None recommended by PT    Recommendations for Other Services       Precautions / Restrictions Precautions Precautions: None Restrictions Weight Bearing Restrictions: No      Mobility  Bed Mobility Overal bed mobility: Independent                Transfers Overall transfer level: Independent Equipment used: None             General transfer comment: Able to rise and maintain balance confidently w/o issue  Ambulation/Gait Ambulation/Gait assistance: Modified independent (Device/Increase time) Gait Distance (Feet): 250 Feet Assistive device: None       General Gait Details: Pt able to ambulate with good confidence, community appropriate speed, no LOBs and reports being at her baseline  Science writer    Modified Rankin (Stroke Patients Only)       Balance Overall balance assessment: Independent                                           Pertinent Vitals/Pain Pain Assessment: No/denies pain     Home Living Family/patient expects to be discharged to:: Private residence Living Arrangements: Children;Other relatives (grand daughter lives with her, son & D-in-L in apt below) Available Help at Discharge: Family;Available 24 hours/day Type of Home: House Home Access: Level entry       Home Equipment: Walker - 2 wheels      Prior Function Level of Independence: Independent         Comments: pt drives, runs errands, cares for 72 y/o grand daughter     Hand Dominance        Extremity/Trunk Assessment   Upper Extremity Assessment Upper Extremity Assessment: Overall WFL for tasks assessed    Lower Extremity Assessment Lower Extremity Assessment: Overall WFL for tasks assessed       Communication   Communication: No difficulties  Cognition Arousal/Alertness: Awake/alert Behavior During Therapy: Restless Overall Cognitive Status: Within Functional Limits for tasks assessed                                        General Comments      Exercises     Assessment/Plan    PT Assessment Patent does not need any further PT services  PT Problem List         PT Treatment Interventions      PT Goals (Current goals can be found in the Care Plan section)  Acute Rehab PT Goals Patient Stated Goal: go home today PT Goal Formulation: All assessment and education complete, DC therapy    Frequency     Barriers to discharge        Co-evaluation               AM-PAC PT "6 Clicks" Mobility  Outcome Measure Help needed turning from your back to your side while in a flat bed without using bedrails?: None Help needed moving from lying on your back to sitting on the side of a flat bed without using bedrails?: None Help needed moving to and from a bed to a chair (including a wheelchair)?: None Help needed standing up from a chair using your arms (e.g., wheelchair or bedside chair)?: None Help needed to walk in hospital room?: None Help  needed climbing 3-5 steps with a railing? : None 6 Click Score: 24    End of Session   Activity Tolerance: Patient tolerated treatment well Patient left: in bed;with call bell/phone within reach Nurse Communication: Mobility status PT Visit Diagnosis: Muscle weakness (generalized) (M62.81);Unsteadiness on feet (R26.81)    Time: 9758-8325 PT Time Calculation (min) (ACUTE ONLY): 17 min   Charges:   PT Evaluation $PT Eval Low Complexity: 1 Low          Kreg Shropshire, DPT 07/11/2020, 9:38 AM

## 2020-07-11 NOTE — Progress Notes (Signed)
Called son Rodman Key and gave him an update on patients condition.

## 2020-07-11 NOTE — Progress Notes (Signed)
IVs removed before discharge. Went over discharge instructions with patient. Patient states that she understands. Patient will be going home POV with son.

## 2020-07-11 NOTE — Discharge Instructions (Signed)
Patient scheduled for outpatient Remdesivir infusions at 10am on Wednesday 9/1 and Thursday 9/2 at [facility]. Please inform the patient to park at [place], as staff will be escorting the patient through the [place] entrance of the hospital. The appointment will take approximately 45 minutes.   There is a wave flag banner located near the entrance on [address]. Turn into this entrance and immediately turn left and park in 1 of the 5 designated Covid Infusion Parking spots. There is a phone number on the sign, please call and let the staff know what spot you are in and we will come out and get you. For questions call [phone].  Thanks.

## 2020-07-11 NOTE — Discharge Summary (Signed)
[facility] Discharge Summary   Patient: Cathy Coffey QMG:[id number]  PCP: Leone Haven, MD  Date of admission: 07/09/2020   Date of discharge: 07/11/2020      Discharge Diagnoses:  Principal diagnosis   Pneumonia due to COVID-19 virus  Admitted From: home Disposition:  Home   Recommendations for Outpatient Follow-up:  1. PCP: please follow up with PCP in  1week as well as Cardiology for 14 day event monitor. 2. Follow up LABS/TEST:  ZIO monitor. remdesivir infusion for two more days    Follow-up Information    Leone Haven, MD. Go on 08/09/2020.   Specialty: Family Medicine Why: at 11:30 AM  Contact information: [place] [address] [phone]        Schedule an appointment as soon as possible for a visit with Wellington Hampshire, MD.   Specialty: Cardiology Why: needs 14 day cardiac monitor Contact information: [address] [place] [address] [phone]              Diet recommendation: Cardiac diet  Activity: The patient is advised to gradually reintroduce usual activities, as tolerated  Discharge Condition: stable  Code Status: Full code   History of present illness: As per the H and P dictated on admission, "Cathy Coffey  is a 79 y.o. female with a known history of hypertension, hypothyroidism, asthma, mild cognitive decline follows with Dr. Manuella Ghazi neurology was tested positive for COVID 19 on 07/07/2020 when she went to urgent care. Patient came in with symptoms of vomiting and passing out spell at home. No details available. She has been feeling poorly per past week."  Hospital Course:  Summary of her active problems in the hospital is as following. 1.syncope with transient CPR in the ER Chronic LBBB. Cardiology consulted. Echocardiogram shows preserved EF with diastolic dysfunction without any valvular abnormality. Telemetry negative so far. No symptoms.  No focal deficit. Patient  will benefit from 14-day cardiac monitor.  2.Hypokalemia Corrected. Monitor.  3.Pneumonia due to COVID-19 Mild hypoxia currently resolved. - received IV Remdesivir -incentive spirometer, bronchodilator as needed -patient saturations 99% on room air.  -no indication for steroids  4.Hypertension -patient is on amlodipine 2.5 mg daily  5.Hypothyroidism -continue Synthroid  6.History of osteoporosis -continue calcium supplements and Fosamax  7.Mild cognitive decline -patient follows with neurology Dr. Manuella Ghazi as outpatient  8.  Obesity Body mass index is 32.22 kg/m.    Patient was seen by physical therapy, who recommended no therapy needed on discharge, On the day of the discharge the patient's vitals were stable, and no other acute medical condition were reported by patient. the patient was felt safe to be discharge at Home with no therapy needed on discharge.  Consultants: Cardiology  Procedures: Echocardiogram   Discharge Exam: General: Appear in no distress, no Rash; Oral Mucosa Clear, moist. Cardiovascular: S1 and S2 Present, no Murmur, Respiratory: normal respiratory effort, Bilateral Air entry present and no Crackles, no wheezes Abdomen: Bowel Sound present, Soft and no tenderness, no hernia Extremities: no Pedal edema, no calf tenderness Neurology: alert and oriented to time, place, and person affect appropriate.  Filed Weights   07/09/20 1211  Weight: 74.8 kg   Vitals:   07/11/20 0811 07/11/20 1204  BP: (!) 144/72 (!) 142/68  Pulse: 74 72  Resp: 17 17  Temp: 98.3 F (36.8 C) 98.3 F (36.8 C)  SpO2: 96% 97%    DISCHARGE MEDICATION: Allergies as of 07/11/2020  Reactions   Augmentin [amoxicillin-pot Clavulanate] Itching   Hives       Medication List    STOP taking these medications   alendronate 70 MG tablet Commonly known as: FOSAMAX     TAKE these medications   ADVANCED JOINT RELIEF PO Take by mouth.   amLODipine 2.5 MG  tablet Commonly known as: NORVASC Take 1 tablet (2.5 mg total) by mouth daily.   ANTACID ANTI-GAS PO Take by mouth.   BIOTIN 5000 PO Take by mouth.   caffeine 200 MG Tabs tablet Take 200 mg by mouth daily.   calcium-vitamin D 500-200 MG-UNIT tablet Take 3 tablets by mouth daily.   desonide 0.05 % cream Commonly known as: DESOWEN Apply topically 2 (two) times daily.   DIGESTIVE AIDS MIXTURE PO Take by mouth.   Fish Oil 1000 MG Cpdr Take 3,000 mg by mouth daily.   fluocinonide gel 0.05 % Commonly known as: LIDEX   furosemide 20 MG tablet Commonly known as: LASIX Take 1 tablet (20 mg total) by mouth daily.   Glucosamine HCl 1000 MG Tabs Take 1 tablet by mouth.   levothyroxine 100 MCG tablet Commonly known as: SYNTHROID Take 1 tablet (100 mcg total) by mouth daily.   Magnesium 250 MG Tabs Take 500 mg by mouth.   NIACIN (ANTIHYPERLIPIDEMIC) PO Take 500 mg by mouth.   pantoprazole 40 MG tablet Commonly known as: PROTONIX TAKE 1 TABLET TWICE DAILY   POTASSIUM PO Take 600 mg by mouth.   PRESERVISION AREDS PO Take 1 tablet by mouth daily.   rosuvastatin 10 MG tablet Commonly known as: Crestor Take 1 tablet (10 mg total) by mouth daily.   SUPER B COMPLEX PO Take 1 capsule by mouth.   TRUBIOTICS PO Take 1 capsule by mouth.   vitamin A 10000 UNIT capsule Take 10,000 Units by mouth daily.   vitamin C 100 MG tablet Take 100 mg by mouth daily.      Allergies  Allergen Reactions   Augmentin [Amoxicillin-Pot Clavulanate] Itching    Hives    Discharge Instructions    Diet - low sodium heart healthy   Complete by: As directed    Increase activity slowly   Complete by: As directed       The results of significant diagnostics from this hospitalization (including imaging, microbiology, ancillary and laboratory) are listed below for reference.    Significant Diagnostic Studies: DG Chest 1 View  Result Date: 07/09/2020 CLINICAL DATA:  Syncope.   COVID-19 positive. EXAM: CHEST  1 VIEW COMPARISON:  Chest x-ray dated February 03, 2013. FINDINGS: Normal heart size. Right paratracheal triangular density. Mild interstitial thickening at the lung bases. No focal consolidation, pleural effusion, or pneumothorax. No acute osseous abnormality. IMPRESSION: 1. Mild bibasilar interstitial thickening, which could reflect viral pneumonia given clinical history. 2. Right paratracheal triangular density. Chest CT with contrast is recommended for further evaluation. Electronically Signed   By: Titus Dubin M.D.   On: 07/09/2020 13:33   CT Angio Chest PE W and/or Wo Contrast  Result Date: 07/09/2020 CLINICAL DATA:  Syncopal episode, cough, myalgias, fatigue, nausea and vomiting, generalized weakness diagnosed with COVID-19 on 07/07/2020, high clinical suspicion of pulmonary embolism, history asthma, hypertension, GERD EXAM: CT ANGIOGRAPHY CHEST WITH CONTRAST TECHNIQUE: Multidetector CT imaging of the chest was performed using the standard protocol during bolus administration of intravenous contrast. Multiplanar CT image reconstructions and MIPs were obtained to evaluate the vascular anatomy. CONTRAST:  53mL OMNIPAQUE IOHEXOL 350 MG/ML SOLN IV  COMPARISON:  None FINDINGS: Cardiovascular: Atherosclerotic calcification aorta and proximal great vessels. Aorta normal caliber without aneurysm or dissection. Heart unremarkable. Minimal pericardial fluid. Pulmonary arteries adequately opacified and patent. No evidence of pulmonary embolism. Mediastinum/Nodes: Esophagus normal appearance. Base of cervical region unremarkable. No thoracic adenopathy. Lungs/Pleura: Minimal dependent density at lung bases. Lungs otherwise clear. No pulmonary infiltrate, pleural effusion, or pneumothorax. Upper Abdomen: Normal appearance Musculoskeletal: Scattered degenerative disc disease changes of cervicothoracic spine. Review of the MIP images confirms the above findings. IMPRESSION: No evidence of  pulmonary embolism. No acute intrathoracic abnormalities. Aortic Atherosclerosis (ICD10-I70.0). Electronically Signed   By: Lavonia Dana M.D.   On: 07/09/2020 16:45   ECHOCARDIOGRAM COMPLETE  Result Date: 07/10/2020    ECHOCARDIOGRAM REPORT   Patient Name:   Cathy Coffey Date of Exam: 07/10/2020 Medical Rec #:  [id number]       Height:       60.0 in Accession #:    [id number]      Weight:       165.0 lb Date of Birth:  Oct 25, 1941       BSA:          1.720 m Patient Age:    67 years        BP:           134/75 mmHg Patient Gender: F               HR:           73 bpm. Exam Location:  [facility] Procedure: 2D Echo, Cardiac Doppler and Color Doppler Indications:     Abnormal heart sounds 785.3  History:         Patient has no prior history of Echocardiogram examinations.                  Risk Factors:Hypertension.  Sonographer:     Sherrie Sport RDCS (AE) Referring Phys:  [id number] SONA Kalyan Barabas Diagnosing Phys: Kathlyn Sacramento MD  Sonographer Comments: Suboptimal parasternal window. IMPRESSIONS  1. Left ventricular ejection fraction, by estimation, is 55 to 60%. The left ventricle has normal function. Left ventricular endocardial border not optimally defined to evaluate regional wall motion. There is mild left ventricular hypertrophy. Left ventricular diastolic parameters are consistent with Grade II diastolic dysfunction (pseudonormalization).  2. Right ventricular systolic function is normal. The right ventricular size is normal. Tricuspid regurgitation signal is inadequate for assessing PA pressure.  3. Left atrial size was mildly dilated.  4. The mitral valve is normal in structure. Mild to moderate mitral valve regurgitation. No evidence of mitral stenosis.  5. The aortic valve is normal in structure. Aortic valve regurgitation is not visualized. Mild aortic valve sclerosis is present, with no evidence of aortic valve stenosis. FINDINGS  Left Ventricle: Left ventricular ejection fraction, by estimation, is 55 to 60%. The left  ventricle has normal function. Left ventricular endocardial border not optimally defined to evaluate regional wall motion. The left ventricular internal cavity size was normal in size. There is mild left ventricular hypertrophy. Left ventricular diastolic parameters are consistent with Grade II diastolic dysfunction (pseudonormalization). Right Ventricle: The right ventricular size is normal. No increase in right ventricular wall thickness. Right ventricular systolic function is normal. Tricuspid regurgitation signal is inadequate for assessing PA pressure. The tricuspid regurgitant velocity is 2.44 m/s, and with an assumed right atrial pressure of 5 mmHg, the estimated right ventricular systolic pressure is 32.9 mmHg. Left Atrium: Left atrial size was mildly dilated. Right Atrium:  Right atrial size was normal in size. Pericardium: There is no evidence of pericardial effusion. Mitral Valve: The mitral valve is normal in structure. Normal mobility of the mitral valve leaflets. Mild to moderate mitral valve regurgitation. No evidence of mitral valve stenosis. Tricuspid Valve: The tricuspid valve is normal in structure. Tricuspid valve regurgitation is trivial. No evidence of tricuspid stenosis. Aortic Valve: The aortic valve is normal in structure. Aortic valve regurgitation is not visualized. Mild aortic valve sclerosis is present, with no evidence of aortic valve stenosis. Aortic valve mean gradient measures 6.5 mmHg. Aortic valve peak gradient measures 12.1 mmHg. Aortic valve area, by VTI measures 2.58 cm. Pulmonic Valve: The pulmonic valve was normal in structure. Pulmonic valve regurgitation is not visualized. No evidence of pulmonic stenosis. Aorta: The aortic root is normal in size and structure. Venous: The inferior vena cava was not well visualized. IAS/Shunts: No atrial level shunt detected by color flow Doppler.  LEFT VENTRICLE PLAX 2D LVIDd:         2.79 cm  Diastology LVIDs:         1.90 cm  LV e'  lateral:   4.24 cm/s LV PW:         1.28 cm  LV E/e' lateral: 21.1 LV IVS:        0.91 cm  LV e' medial:    2.94 cm/s LVOT diam:     2.00 cm  LV E/e' medial:  30.4 LV SV:         84 LV SV Index:   49 LVOT Area:     3.14 cm  RIGHT VENTRICLE RV Basal diam:  2.06 cm LEFT ATRIUM           Index       RIGHT ATRIUM          Index LA diam:      4.10 cm 2.38 cm/m  RA Area:     8.99 cm LA Vol (A4C): 41.9 ml 24.36 ml/m RA Volume:   16.60 ml 9.65 ml/m  AORTIC VALVE                    PULMONIC VALVE AV Area (Vmax):    2.47 cm     PV Vmax:        0.67 m/s AV Area (Vmean):   2.47 cm     PV Peak grad:   1.8 mmHg AV Area (VTI):     2.58 cm     RVOT Peak grad: 4 mmHg AV Vmax:           174.00 cm/s AV Vmean:          122.500 cm/s AV VTI:            0.324 m AV Peak Grad:      12.1 mmHg AV Mean Grad:      6.5 mmHg LVOT Vmax:         137.00 cm/s LVOT Vmean:        96.200 cm/s LVOT VTI:          0.266 m LVOT/AV VTI ratio: 0.82  AORTA Ao Root diam: 2.80 cm MITRAL VALVE                TRICUSPID VALVE MV Area (PHT): 2.83 cm     TR Peak grad:   23.8 mmHg MV Decel Time: 268 msec     TR Vmax:        244.00 cm/s MV E velocity:  89.50 cm/s MV A velocity: 100.00 cm/s  SHUNTS MV E/A ratio:  0.90         Systemic VTI:  0.27 m                             Systemic Diam: 2.00 cm Kathlyn Sacramento MD Electronically signed by Kathlyn Sacramento MD Signature Date/Time: 07/10/2020/12:37:40 PM    Final    Microbiology: No results found for this or any previous visit (from the past 240 hour(s)).   Labs: CBC: Recent Labs  Lab 07/09/20 1217 07/09/20 1902  WBC 4.1 3.4*  NEUTROABS 2.4  --   HGB 13.1 13.2  HCT 40.2 40.3  MCV 86.6 87.6  PLT 134* 878*   Basic Metabolic Panel: Recent Labs  Lab 07/09/20 1217 07/09/20 1902 07/10/20 0556 07/11/20 0547  NA 141  --  142 141  K 2.8* 3.5 4.6 3.8  CL 104  --  109 111  CO2 24  --  28 25  GLUCOSE 138*  --  114* 91  BUN 18  --  15 21  CREATININE 0.81 0.67 0.76 0.68  CALCIUM 9.3  --  8.5* 8.6*   MG 2.1  --   --   --    Liver Function Tests: Recent Labs  Lab 07/09/20 1217 07/10/20 0556  AST 30 27  ALT 24 22  ALKPHOS 62 54  BILITOT 0.6 0.6  PROT 7.0 6.3*  ALBUMIN 3.9 3.6   No results for input(s): LIPASE, AMYLASE in the last 168 hours. No results for input(s): AMMONIA in the last 168 hours. Cardiac Enzymes: No results for input(s): CKTOTAL, CKMB, CKMBINDEX, TROPONINI in the last 168 hours. BNP (last 3 results) Recent Labs    07/09/20 1217  BNP 22.0   CBG: Recent Labs  Lab 07/09/20 1251  GLUCAP 127*    Time spent: 35 minutes  Signed:  Berle Mull  [facility] 07/11/2020 11:07 PM

## 2020-07-11 NOTE — Progress Notes (Signed)
Patient scheduled for outpatient Remdesivir infusions at 10am on Wednesday 9/1 and Thursday 9/2 at [facility]. Please inform the patient to park at [place], as staff will be escorting the patient through the [place] entrance of the hospital.    There is a wave flag banner located near the entrance on [address]. Turn into this entrance and immediately turn left and park in 1 of the 5 designated Covid Infusion Parking spots. There is a phone number on the sign, please call and let the staff know what spot you are in and we will come out and get you. For questions call [phone].  Thanks.

## 2020-07-11 NOTE — Telephone Encounter (Signed)
Patient called in for hospital f/u was scheduled on 9-28--21 11:30am

## 2020-07-11 NOTE — Progress Notes (Signed)
PHARMACY CONSULT NOTE - FOLLOW UP  Pharmacy Consult for Electrolyte Monitoring and Replacement   Recent Labs: Potassium (mmol/L)  Date Value  07/11/2020 3.8  11/15/2014 3.9   Magnesium (mg/dL)  Date Value  07/09/2020 2.1   Calcium (mg/dL)  Date Value  07/11/2020 8.6 (L)   Calcium, Total (mg/dL)  Date Value  11/15/2014 8.8   Albumin (g/dL)  Date Value  07/10/2020 3.6  11/15/2014 3.6   Sodium (mmol/L)  Date Value  07/11/2020 141  11/15/2014 141   Assessment: 79 yo female presented to ED with witnessed syncopal episode.  Patient has has N/V/D for several days. She tested positive for COVID-19 on 8/27. Potassium 2.8 on admission.    Goal of Therapy:  Electrolytes WNL  Plan:  K=3.8 (from 4.6) - will replete with KCl 10meq po x 1  Pharmacy will continue to follow and replace as needed.   Lu Duffel, PharmD, BCPS Clinical Pharmacist 07/11/2020 7:21 AM

## 2020-07-11 NOTE — Telephone Encounter (Signed)
Patient called in for hospital f/u in 2 weeks scheduled on the 9

## 2020-07-12 ENCOUNTER — Ambulatory Visit (HOSPITAL_COMMUNITY)
Admission: RE | Admit: 2020-07-12 | Discharge: 2020-07-12 | Disposition: A | Discharge status: Home / Self Care | Payer: Medicare HMO | Source: Ambulatory Visit | Attending: Pulmonary Disease | Admitting: Pulmonary Disease

## 2020-07-12 ENCOUNTER — Telehealth: Payer: Self-pay | Admitting: Family Medicine

## 2020-07-12 ENCOUNTER — Telehealth: Payer: Self-pay

## 2020-07-12 ENCOUNTER — Ambulatory Visit (HOSPITAL_COMMUNITY): Payer: Medicare HMO

## 2020-07-12 DIAGNOSIS — U071 COVID-19: Secondary | ICD-10-CM | POA: Diagnosis not present

## 2020-07-12 DIAGNOSIS — J1289 Other viral pneumonia: Secondary | ICD-10-CM | POA: Insufficient documentation

## 2020-07-12 MED ORDER — SODIUM CHLORIDE 0.9 % IV SOLN
100.0000 mg | Freq: Once | INTRAVENOUS | Status: AC
  Administered 2020-07-12: 100 mg via INTRAVENOUS

## 2020-07-12 MED ORDER — EPINEPHRINE 0.3 MG/0.3ML IJ SOAJ: 0.3000 mg | Freq: Once | INTRAMUSCULAR | Status: DC | PRN

## 2020-07-12 MED ORDER — METHYLPREDNISOLONE SODIUM SUCC 125 MG IJ SOLR: 125.0000 mg | Freq: Once | INTRAMUSCULAR | Status: DC | PRN

## 2020-07-12 MED ORDER — DIPHENHYDRAMINE HCL 50 MG/ML IJ SOLN: 50.0000 mg | Freq: Once | INTRAMUSCULAR | Status: DC | PRN

## 2020-07-12 MED ORDER — FAMOTIDINE IN NACL 20-0.9 MG/50ML-% IV SOLN: 20.0000 mg | Freq: Once | INTRAVENOUS | Status: DC | PRN

## 2020-07-12 MED ORDER — ALBUTEROL SULFATE HFA 108 (90 BASE) MCG/ACT IN AERS: 2.0000 | INHALATION_SPRAY | Freq: Once | RESPIRATORY_TRACT | Status: DC | PRN

## 2020-07-12 MED ORDER — ONDANSETRON HCL 4 MG/2ML IJ SOLN
4.0000 mg | Freq: Once | INTRAMUSCULAR | Status: AC
  Administered 2020-07-12: 4 mg via INTRAVENOUS
  Filled 2020-07-12: qty 2

## 2020-07-12 MED ORDER — SODIUM CHLORIDE 0.9 % IV SOLN: INTRAVENOUS | Status: DC | PRN

## 2020-07-12 NOTE — Telephone Encounter (Signed)
Transition Care Management Follow-up Telephone Call  Date of discharge and from where: 07/11/20 from [facility]  How have you been since you were released from the hospital? Diarrhea and nausea improved with adult pedialyte drink. Chest congestion with productive cough, intermittent. Weakness. No oxygen in use. Monitors vital signs with most recent check today HR 87, 128/90, oxygen room air 97-98%. Deep breathing exercises and inhaler in use every 6 hours. Denies headache, fever, vomiting, dizziness, syncope, confusion, falls, pain. Drinking plenty of fluids to stay hydrated. Urinating frequently.Appetite decrease.    Any questions or concerns? No  Items Reviewed:  Did the pt receive and understand the discharge instructions provided? Yes , increase activity slowly. Do not drive for about 3 weeks.   Medications obtained and verified? Yes , taking all scheduled medications as directed.   Any new allergies since your discharge? No   Dietary orders reviewed? Low sodium, heart healthy  Do you have support at home? Yes , family support.   Functional Questionnaire: (I = Independent and D = Dependent) ADLs: I, paces herself.   Follow up appointments reviewed:   PCP Hospital f/u appt confirmed? Yes  Scheduled to see Denice Paradise on 07/13/20 @ 2:00 via mychart video.  [facility] f/u appt ? Yes , scheduled to see Cardiology.   Are transportation arrangements needed? No   If their condition worsens, is the pt aware to call PCP or go to the Emergency Dept.? Yes  Was the patient provided with contact information for the PCP's office or ED? Yes  Was to pt encouraged to call back with questions or concerns? Yes

## 2020-07-12 NOTE — Telephone Encounter (Signed)
Pt called and wanted to know if her BP medication needs to be increased her  BP right now 139/99

## 2020-07-12 NOTE — Telephone Encounter (Signed)
Patient called about her BP being elevated and after speaking with her she needed a visit, she is scheduled to see K. Jerelene Redden tomorrow for her Covid symptoms.  Phyllicia Dudek,cma

## 2020-07-12 NOTE — Progress Notes (Signed)
  Diagnosis: COVID-19  Physician:Dr Joya Gaskins  Procedure: [facility] Infusion Clinic Med: remdesivir infusion - Provided patient with remdesivir fact sheet for patients, parents and caregivers prior to infusion.  Complications: No immediate complications noted.  Discharge: Discharged home   [place], [place] 07/12/2020

## 2020-07-12 NOTE — Discharge Instructions (Signed)

## 2020-07-13 ENCOUNTER — Ambulatory Visit (HOSPITAL_COMMUNITY)
Admission: RE | Admit: 2020-07-13 | Discharge: 2020-07-13 | Disposition: A | Discharge status: Home / Self Care | Payer: Medicare HMO | Source: Ambulatory Visit | Attending: Pulmonary Disease | Admitting: Pulmonary Disease

## 2020-07-13 ENCOUNTER — Telehealth (INDEPENDENT_AMBULATORY_CARE_PROVIDER_SITE_OTHER): Payer: Medicare HMO | Admitting: Nurse Practitioner

## 2020-07-13 ENCOUNTER — Encounter: Payer: Self-pay | Admitting: Nurse Practitioner

## 2020-07-13 ENCOUNTER — Other Ambulatory Visit: Payer: Self-pay

## 2020-07-13 VITALS — BP 127/88 | HR 87 | Temp 98.9°F | Ht 60.0 in | Wt 163.0 lb

## 2020-07-13 DIAGNOSIS — J1282 Pneumonia due to coronavirus disease 2019: Secondary | ICD-10-CM

## 2020-07-13 DIAGNOSIS — J22 Unspecified acute lower respiratory infection: Secondary | ICD-10-CM | POA: Insufficient documentation

## 2020-07-13 DIAGNOSIS — U071 COVID-19: Secondary | ICD-10-CM | POA: Diagnosis not present

## 2020-07-13 DIAGNOSIS — I447 Left bundle-branch block, unspecified: Secondary | ICD-10-CM | POA: Diagnosis not present

## 2020-07-13 DIAGNOSIS — G4733 Obstructive sleep apnea (adult) (pediatric): Secondary | ICD-10-CM | POA: Diagnosis not present

## 2020-07-13 DIAGNOSIS — R55 Syncope and collapse: Secondary | ICD-10-CM | POA: Diagnosis not present

## 2020-07-13 DIAGNOSIS — J1289 Other viral pneumonia: Secondary | ICD-10-CM | POA: Insufficient documentation

## 2020-07-13 MED ORDER — ALBUTEROL SULFATE HFA 108 (90 BASE) MCG/ACT IN AERS: 2.0000 | INHALATION_SPRAY | Freq: Once | RESPIRATORY_TRACT | Status: DC | PRN

## 2020-07-13 MED ORDER — FAMOTIDINE IN NACL 20-0.9 MG/50ML-% IV SOLN: 20.0000 mg | Freq: Once | INTRAVENOUS | Status: DC | PRN

## 2020-07-13 MED ORDER — SODIUM CHLORIDE 0.9 % IV SOLN
100.0000 mg | Freq: Once | INTRAVENOUS | Status: AC
  Administered 2020-07-13: 100 mg via INTRAVENOUS
  Filled 2020-07-13: qty 20

## 2020-07-13 MED ORDER — EPINEPHRINE 0.3 MG/0.3ML IJ SOAJ: 0.3000 mg | Freq: Once | INTRAMUSCULAR | Status: DC | PRN

## 2020-07-13 MED ORDER — METHYLPREDNISOLONE SODIUM SUCC 125 MG IJ SOLR: 125.0000 mg | Freq: Once | INTRAMUSCULAR | Status: DC | PRN

## 2020-07-13 MED ORDER — DIPHENHYDRAMINE HCL 50 MG/ML IJ SOLN: 50.0000 mg | Freq: Once | INTRAMUSCULAR | Status: DC | PRN

## 2020-07-13 MED ORDER — SODIUM CHLORIDE 0.9 % IV SOLN: INTRAVENOUS | Status: DC | PRN

## 2020-07-13 NOTE — Patient Instructions (Addendum)
Continue with your normal medication as advised on hospital discharge.    Please begin Vit C 1000 mg daily  Vitamin D3 4000 IU daily  Zinc 100 g daily  OK to take your  Mucinex DM  for cough.   Use the Ipratropium bromide inhaler:  2 puffs every 6 hours as directed to keep the lungs open.  Rest, hydrate very well, eat healthy protein food, Tylenol or Advil as directed . Call back if develop worsening symptoms.   Call back for a virtual video next week again.   Seek  treatment in the ED if respiratory issues/distress develops or unrelieved chest pain. Monitor for fever.  1). Please check your pulse oximeter throughout the day. If your oxygen level falls below 90% and stays there,  Return to the ER  For urgent assistance.   2). Continue isolation for a minimum of 10 days.  All symptoms should be improving or resolved in order to break quarantine  you must be fever free without the use of tylenol, aleve or motrin for 72 hours  before breaking isolation  and/or returning to work .   3). Only leave home to seek medical care and must wear a mask in public. Limit contact with family members or caregivers in the home and to notify her family who she was with yesterday that they should quarantine for 14 days. Practice social distancing and to continue to use good preventative care measures such has frequent hand washing.

## 2020-07-13 NOTE — Progress Notes (Signed)
Virtual Visit via Virtual Note  This visit type was conducted due to national recommendations for restrictions regarding the COVID-19 pandemic (e.g. social distancing).  This format is felt to be most appropriate for this patient at this time.  All issues noted in this document were discussed and addressed.  No physical exam was performed (except for noted visual exam findings with Video Visits).   I connected with@ on 07/13/20 at  2:00 PM EDT by a video enabled telemedicine application or telephone and verified that I am speaking with the correct person using two identifiers. Location patient: home Location provider: work or home office Persons participating in the virtual visit: patient, provider  I discussed the limitations, risks, security and privacy concerns of performing an evaluation and management service by telephone and the availability of in person appointments. I also discussed with the patient that there may be a patient responsible charge related to this service. The patient expressed understanding and agreed to proceed.  Hospital Course:  Summary of her active problems in the hospital is as following. 1.syncope with transient CPR in the ER Chronic LBBB. Cardiology consulted. Echocardiogram shows preserved EF with diastolic dysfunction without any valvular abnormality. Telemetry negative so far. No symptoms. No focal deficit. Patient will benefit from 14-day cardiac monitor.  2.Hypokalemia Corrected. Monitor.  3.Pneumonia due to COVID-19 Mild hypoxia currently resolved. - received IV Remdesivir -incentive spirometer, bronchodilator as needed -patient saturations 99% on room air.  -no indication for steroids  4.Hypertension -patient is on amlodipine 2.5 mg daily  5.Hypothyroidism -continue Synthroid  6.History of osteoporosis -continue calcium supplements and Fosamax  7.Mild cognitive decline -patient follows with neurology Dr. Manuella Ghazi as  outpatient  8.Obesity Body mass index is 32.22 kg/m.      Reason for visit: She was admitted 07/09/2020 and discharged 07/11/2020.  Principal diagnosis pneumonia due to COVID-19 virus.  She was asked to follow-up with cardiology for 14-day event monitor.  She was asked to follow-up with her PCP Dr. Caryl Bis.  HPI: This 79 year old patient with history of HTN, OSA, GERD, hypothyroidism, memory difficulty, depression, went to urgent care on 06/2720 with Covid symptoms of vomiting, generalized weakness, fatigue, passing out spells and was admitted to the hospital.   In the ER,  she was visiting with her family and felt faint.  Nursing staff found her hypotension, unresponsive and required CPR for 10 seconds and recovered with good pulse and blood pressure.  She had potassium of 2.8.  She was given IV fluids, potassium supplementation, and EKG showed sinus rhythm, left bundle branch block.  She was given incentive spirometry, bronchodilators, no indication of steroids.  Chest x-ray did show mild basilar bibasilar interstitial thickening which could reflect a viral pneumonia given clinical history.  There was a right paratracheal triangle angular density.  CT angio chest PE with and without contrast chest CT with contrast showed no PE, no acute intrathoracic abnormalities, positive aortic atherosclerosis.  She was given IV remdesivir.  She has MAB infusion on 07/12/2020.  Today, she is feeling better.   No SOB or wheezing. No CP,  no pre syncope. She is  using the Atrovent that was given to her in the hospital.  She has Mucinex to help loosen secretions.  She has not been using her Claritin or Flonase as she has no nasal congestion.She is coughing up loose congestion.  Chest is still very congested. She has had no wheezing, or chest tightness.  Pulse oximetry is 96% and with walking during our conversation  at 95%. No fevers, T-max 98.3 .  She is monitoring her temperature, and her blood pressure.  Blood  pressure was up a little yesterday but it was normal today.  She has mild monitoring her pulse oximetry.  No N/V. Poor appetite.  She had diarrhea and took Imodium once.  Hydrating well Pedialyte. A little dizzy with walking, but  getting better. No leg swelling or leg pain. She is feeling better today than yesterday.    She takes low-dose vitamin C vitamin D and zinc.   Patient is alert and oriented without confusion.  She has noted no MAB infusion side effects.  She did not have the Covid vaccine.  ROS: See pertinent positives and negatives per HPI.  Past Medical History:  Diagnosis Date  . Asthma   . Diverticulosis   . Hypertension   . Hypothyroidism   . IBS (irritable bowel syndrome)   . Lichen planus   . Pancreatitis due to common bile duct stone 2000    Past Surgical History:  Procedure Laterality Date  . CHOLECYSTECTOMY    . COLONOSCOPY  2011  . HERNIA REPAIR    . MOUTH SURGERY  09/2016  . TUBAL LIGATION      Family History  Problem Relation Age of Onset  . Hypothyroidism Mother   . Hypertension Mother   . Dementia Father   . Dementia Paternal Grandmother     SOCIAL HX: Non smoker.   Current Outpatient Medications:  .  Alum & Mag Hydroxide-Simeth (ANTACID ANTI-GAS PO), Take by mouth., Disp: , Rfl:  .  amLODipine (NORVASC) 2.5 MG tablet, Take 1 tablet (2.5 mg total) by mouth daily., Disp: 90 tablet, Rfl: 3 .  B Complex-C (SUPER B COMPLEX PO), Take 1 capsule by mouth., Disp: , Rfl:  .  Calcium Carbonate-Vitamin D (CALCIUM-VITAMIN D) 500-200 MG-UNIT per tablet, Take 3 tablets by mouth daily. , Disp: , Rfl:  .  desonide (DESOWEN) 0.05 % cream, Apply topically 2 (two) times daily., Disp: 30 g, Rfl: 0 .  DIGESTIVE AIDS MIXTURE PO, Take by mouth., Disp: , Rfl:  .  fluocinonide gel (LIDEX) 0.05 %, , Disp: , Rfl:  .  furosemide (LASIX) 20 MG tablet, Take 1 tablet (20 mg total) by mouth daily., Disp: 90 tablet, Rfl: 3 .  Glucosamine HCl 1000 MG TABS, Take 1 tablet by  mouth., Disp: , Rfl:  .  levothyroxine (SYNTHROID) 100 MCG tablet, Take 1 tablet (100 mcg total) by mouth daily., Disp: 90 tablet, Rfl: 3 .  Magnesium 250 MG TABS, Take 500 mg by mouth. , Disp: , Rfl:  .  Multiple Vitamins-Minerals (PRESERVISION AREDS PO), Take 1 tablet by mouth daily. , Disp: , Rfl:  .  Omega-3 Fatty Acids (FISH OIL) 1000 MG CPDR, Take 3,000 mg by mouth daily. , Disp: , Rfl:  .  pantoprazole (PROTONIX) 40 MG tablet, TAKE 1 TABLET TWICE DAILY (Patient taking differently: Take 40 mg by mouth 2 (two) times daily. ), Disp: 180 tablet, Rfl: 1 .  POTASSIUM PO, Take 600 mg by mouth., Disp: , Rfl:  .  Probiotic Product (TRUBIOTICS PO), Take 1 capsule by mouth., Disp: , Rfl:  .  rosuvastatin (CRESTOR) 10 MG tablet, Take 1 tablet (10 mg total) by mouth daily., Disp: 90 tablet, Rfl: 3 No current facility-administered medications for this visit.  Facility-Administered Medications Ordered in Other Visits:  .  0.9 %  sodium chloride infusion, , Intravenous, PRN, Lavina Hamman, MD .  albuterol (VENTOLIN HFA)  108 (90 Base) MCG/ACT inhaler 2 puff, 2 puff, Inhalation, Once PRN, Lavina Hamman, MD .  diphenhydrAMINE (BENADRYL) injection 50 mg, 50 mg, Intravenous, Once PRN, Lavina Hamman, MD .  EPINEPHrine (EPI-PEN) injection 0.3 mg, 0.3 mg, Intramuscular, Once PRN, Lavina Hamman, MD .  famotidine (PEPCID) IVPB 20 mg premix, 20 mg, Intravenous, Once PRN, Lavina Hamman, MD .  methylPREDNISolone sodium succinate (SOLU-MEDROL) 125 mg/2 mL injection 125 mg, 125 mg, Intravenous, Once PRN, Lavina Hamman, MD  EXAM:  VITALS per patient if applicable: 673/41, pulse 87, blood pressure 127/88, temp 98.9, weight 163, pulse ox 96%  GENERAL: alert, oriented, appears fatigued, but looks good considering her recent admission and in no acute distress. Weak .   HEENT: atraumatic, conjunctiva clear, no obvious abnormalities on inspection of external nose and ears  NECK: normal movements of the head  and neck  LUNGS: on inspection no signs of respiratory distress, breathing rate appears normal, no obvious gross SOB, gasping or wheezing. She has a congested  cough a few times during 18 min video. Walking and talking- no breathiness and pulse ox 96-97% rest and down to 95% with activity.   CV: no obvious cyanosis  MS: moves all visible extremities without noticeable abnormality  PSYCH/NEURO: pleasant and cooperative, no obvious depression or anxiety, speech and thought processing grossly intact  ASSESSMENT AND PLAN:  Discussed the following assessment and plan:  Pneumonia due to COVID-19 virus - Plan: Ambulatory referral to Cardiology  Syncope and collapse - Plan: Ambulatory referral to Cardiology  Left bundle branch block - Plan: Ambulatory referral to Cardiology  OSA (obstructive sleep apnea)  Advised:  Continue with your normal medication as advised on hospital discharge.    Please begin Vit C 1000 mg daily  Vitamin D3 4000 IU daily  Zinc 100 g daily  OK to take your  Mucinex DM  for cough.   Use the Ipratropium bromide inhaler:  2 puffs every 6 hours as directed to keep the lungs open.  She had a dose of IV prednisone today with her remdesivir infusion.  Rest, hydrate very well, eat healthy protein food, Tylenol or Advil as directed . Call back if develop worsening symptoms.   Call back for a virtual video next week again.   Seek  treatment in the ED if respiratory issues/distress develops or unrelieved chest pain. Monitor for fever.  1). Please check your pulse oximeter throughout the day. If your oxygen level falls below 90% and stays there,  Return to the ER  For urgent assistance.   2). Continue isolation for a minimum of 10 days.  All symptoms should be improving or resolved in order to break quarantine  you must be fever free without the use of tylenol, aleve or motrin for 72 hours  before breaking isolation  and/or returning to work .   3). Only leave home  to seek medical care and must wear a mask in public. Limit contact with family members or caregivers in the home and to notify her family who she was with yesterday that they should quarantine for 14 days. Practice social distancing and to continue to use good preventative care measures such has frequent hand washing.   I discussed the assessment and treatment plan with the patient. The patient was provided an opportunity to ask questions and all were answered. The patient agreed with the plan and demonstrated an understanding of the instructions.   The patient was advised to call back  or seek an in-person evaluation if the symptoms worsen or if the condition fails to improve as anticipated.  I provided 30 minutes of non-face-to-face time during this encounter with time spent talking with patient, reviewing labs, imaging, hospital records, medication list, and coordination of care.    She needs a video visit early next week.  Denice Paradise, NP Adult Nurse Practitioner [place] [phone]

## 2020-07-13 NOTE — Discharge Instructions (Signed)

## 2020-07-13 NOTE — Progress Notes (Signed)
°  Diagnosis: COVID-19  Physician:Dr wright  Procedure: Covid Infusion Clinic Med: remdesivir infusion - Provided patient with remdesivir fact sheet for patients, parents and caregivers prior to infusion.  Complications: No immediate complications noted.  Discharge: Discharged home   [place], [place] 07/13/2020

## 2020-07-15 ENCOUNTER — Telehealth: Payer: Self-pay

## 2020-07-15 NOTE — Telephone Encounter (Signed)
Pt called in with concerns of dizziness & concerns of a low potassium level. Pt is covid positive with pneumonia. Advised that we did not have any appointments for today, and to proceed to the local urgent care where they can check her potassium level. I advised her to call ahead and let them know that she does have covid to make sure that they don't have any special protocols in place. Pt verbalized understanding.

## 2020-07-16 ENCOUNTER — Other Ambulatory Visit
Admission: RE | Admit: 2020-07-16 | Discharge: 2020-07-16 | Disposition: A | Discharge status: Home / Self Care | Payer: Medicare HMO | Source: Ambulatory Visit | Attending: Nurse Practitioner | Admitting: Nurse Practitioner

## 2020-07-16 ENCOUNTER — Telehealth: Payer: Self-pay | Admitting: Nurse Practitioner

## 2020-07-16 DIAGNOSIS — U071 COVID-19: Secondary | ICD-10-CM | POA: Insufficient documentation

## 2020-07-16 DIAGNOSIS — J1282 Pneumonia due to coronavirus disease 2019: Secondary | ICD-10-CM | POA: Diagnosis not present

## 2020-07-16 LAB — BASIC METABOLIC PANEL
Anion gap: 9 (ref 5–15)
BUN: 14 mg/dL (ref 8–23)
CO2: 26 mmol/L (ref 22–32)
Calcium: 9 mg/dL (ref 8.9–10.3)
Chloride: 106 mmol/L (ref 98–111)
Creatinine, Ser: 0.71 mg/dL (ref 0.44–1.00)
GFR calc Af Amer: >60 mL/min (ref >60)
GFR calc non Af Amer: >60 mL/min (ref >60)
Glucose, Bld: 115 mg/dL — ABNORMAL HIGH (ref 70–99)
Potassium: 4.7 mmol/L (ref 3.5–5.1)
Sodium: 141 mmol/L (ref 135–145)

## 2020-07-16 LAB — MAGNESIUM: Magnesium: 2.5 mg/dL — ABNORMAL HIGH (ref 1.7–2.4)

## 2020-07-16 MED ORDER — BENZONATATE 100 MG PO CAPS: 200.0000 mg | ORAL_CAPSULE | Freq: Three times a day (TID) | ORAL | 0 refills | Status: DC | PRN

## 2020-07-16 NOTE — Telephone Encounter (Addendum)
I spoke with Cathy Coffey today for follow-up. I saw the note yesterday where she was reporting dizzy spells and worried about hypokalemia again. She went to the hospital but it was too full so she left without being seen. Yesterday, she studied up on potassium rich foods and ate a baked potato, started on over-the-counter potassium pills, drank adult Pedialyte and she is feeling better today. No longer having dizzy spells. Her pulse ox is 96-97% with heart rate 72. She has no  shortness of breath or wheezing. Coughs in spells- not bad.Using Atrovent inhaler every 6 hours. Mucinex DM at night and sleeps through the night. Cough causes HA. She would like cough medicine for daytime.  She does have persistent weakness, and cannot walk long distances. She is eating and drinking well.Hx of IBS and the bad Covid diarrhea is gone. Small loose stool- x 3 per day at her baseline.  She is mentating well, and sounds normal on the phone today.She has been off of Lasix since her admission. No edema, wt gain or orthopnea. She lives with family who are attentive. Appt with Dr. Caryl Bis 9/29/ and Cardiology Dr. Fletcher Anon on 08/10/20.    Plan: She will go to [facility] today to get a stat Bmet and magnesium levels drawn. I placed the orders. Her son will drive her, and her granddaughter who has recovered from Covid, will escort her and will take her  in a wheelchair to the lab. They are aware of need to wear a  Mask.  2. Labs came back OK. Patient was notified. I ordered [employer]   Cathy Coffey:  1. Please arrange:  I want her to be seen at the [facility] for in person non- urgent care early this week. She was also given the phone number-[phone] to call on Tuesday, but we may need to give the referral call.  2. Call her on Tues for cough update with the Tessalon Perles. Thanks.

## 2020-07-18 ENCOUNTER — Telehealth: Payer: Self-pay | Admitting: Family Medicine

## 2020-07-18 NOTE — Telephone Encounter (Signed)
Please call [phone] and ask how we refer a pt to the in-person non-urgent Covid Clinic since the phone number we were given: [phone] is not working.

## 2020-07-18 NOTE — Telephone Encounter (Signed)
I tried to call the number you gave me but it will not let me speak to anyone. This is only to leave a message on a VM for the infusion

## 2020-07-18 NOTE — Telephone Encounter (Signed)
Patient called in stated that she spoke with Denice Paradise on Sunday she gave her a phone to call a covid clinic but the number is not a working number please advise

## 2020-07-18 NOTE — Telephone Encounter (Signed)
Spoke with patient and will have to call her back with working number as number is not in service anymore

## 2020-07-18 NOTE — Telephone Encounter (Signed)
Patient states she had stopped using the inhaler Sunday; cough was better but when she stopped using the inhaler cough got worse so she went back to using the inhaler yesterday. Cough got better again with inhaler use. Also states her vitals have been good and oxygen is at 98%; Pulse rate is 84 this am. Patient called [facility] number for clinic you wanted her to be seen at and number is not in service per patient. I tried to call as well and says number is not in service.

## 2020-07-19 NOTE — Telephone Encounter (Signed)
Clinic is no longer needed for this patient per Cathy Coffey. See other TE note

## 2020-07-19 NOTE — Telephone Encounter (Signed)
She is doing well. No cough or SOB. Energy is better and she is feeling very well.  I spoke with Shyanna. She does not need the in-person clinic.   Advised to get CXR for a f/up Covid CXR the week of Oct 11.  [name]:  Please give her an appt if needed.

## 2020-07-20 ENCOUNTER — Telehealth: Payer: Self-pay | Admitting: Family Medicine

## 2020-07-20 NOTE — Telephone Encounter (Signed)
Patient states she saw Dawson Bills for covid and would like to speak to her only.

## 2020-07-24 ENCOUNTER — Encounter: Payer: Self-pay | Admitting: Internal Medicine

## 2020-07-24 ENCOUNTER — Other Ambulatory Visit: Payer: Self-pay

## 2020-07-24 ENCOUNTER — Telehealth (INDEPENDENT_AMBULATORY_CARE_PROVIDER_SITE_OTHER): Payer: Medicare HMO | Admitting: Internal Medicine

## 2020-07-24 VITALS — BP 122/75 | HR 86 | Temp 100.3°F | Resp 96 | Ht 60.0 in | Wt 165.0 lb

## 2020-07-24 DIAGNOSIS — U071 COVID-19: Secondary | ICD-10-CM

## 2020-07-24 DIAGNOSIS — J22 Unspecified acute lower respiratory infection: Secondary | ICD-10-CM | POA: Diagnosis not present

## 2020-07-24 DIAGNOSIS — R059 Cough, unspecified: Secondary | ICD-10-CM

## 2020-07-24 DIAGNOSIS — R05 Cough: Secondary | ICD-10-CM

## 2020-07-24 MED ORDER — PREDNISONE 10 MG PO TABS: ORAL_TABLET | ORAL | 0 refills | Status: DC

## 2020-07-24 MED ORDER — AZITHROMYCIN 500 MG PO TABS: 500.0000 mg | ORAL_TABLET | Freq: Every day | ORAL | 0 refills | Status: DC

## 2020-07-24 NOTE — Telephone Encounter (Signed)
Patient called last week stated that she has covid and would like a call back she is getting worse not has called her yet.

## 2020-07-24 NOTE — Telephone Encounter (Signed)
Patient called and was triaged by Fransisco Beau and Caller states she is COVID positive and has pneumonia and it has been 19 days. No fever. Coughing is not getting better, but is productive. O2 is 96-98. No difficulty breathing but fatigue.  David Towson,cma

## 2020-07-24 NOTE — Assessment & Plan Note (Signed)
COVID was diagnosed on August  25 and treated with 5 days of Remdesivir from August 27 to Sept 2.  Recurrence of fevers and cough productive of purulent sputum concerning for post COVID bacterial PNA>  Sending to [facility] for chest x ay.  Empiric  azithromycin 500 mg daily x 7 days,  Repeat steroid taper to start after 48 hours if not feeling better.

## 2020-07-24 NOTE — Telephone Encounter (Signed)
Patient stated she has an appointment with [facility].  Levy Cedano,cma

## 2020-07-24 NOTE — Progress Notes (Signed)
Virtual Visit via [place] Note  This visit type was conducted due to national recommendations for restrictions regarding the COVID-19 pandemic (e.g. social distancing).  This format is felt to be most appropriate for this patient at this time.  All issues noted in this document were discussed and addressed.  No physical exam was performed (except for noted visual exam findings with Video Visits).   I connected with@ on 07/24/20 at  3:00 PM EDT by a video enabled telemedicine application and verified that I am speaking with the correct person using two identifiers. Location patient: home Location provider: work or home office Persons participating in the virtual visit: patient, provider  I discussed the limitations, risks, security and privacy concerns of performing an evaluation and management service by telephone and the availability of in person appointments. I also discussed with the patient that there may be a patient responsible charge related to this service. The patient expressed understanding and agreed to proceed.   Reason for visit: persistent fevers. RECENT COVID INFECTION DIAGNOSIS  HPI:  79 yr old female with HTN, OSA, diagnosed with COVID on August 25.  Admitted to [facility] on August 27 with severe hypokalemia resulting in cardiac arrest.  CPR done briefly with return of BP and pulse.  Received Remdesivir  For a total of 5 days,  including 2 days after discharge Discharged home on  August 31 .   STARTED steroid taper  on Sept 2 ,  Improved transiently but startin gon Sept 10 begin having lg fevers 100.3 Tmax.  Productive cough  Dyspnea and chest  Tightness.  Pulse ox 96%  BP ok .  CT angio was done on August 27 during brief admission,  No infiltrates.    ROS: See pertinent positives and negatives per HPI.  Past Medical History:  Diagnosis Date  . Asthma   . Diverticulosis   . Hypertension   . Hypothyroidism   . IBS (irritable bowel syndrome)   . Lichen planus   .  Pancreatitis due to common bile duct stone 2000    Past Surgical History:  Procedure Laterality Date  . CHOLECYSTECTOMY    . COLONOSCOPY  2011  . HERNIA REPAIR    . MOUTH SURGERY  09/2016  . TUBAL LIGATION      Family History  Problem Relation Age of Onset  . Hypothyroidism Mother   . Hypertension Mother   . Dementia Father   . Dementia Paternal Grandmother     SOCIAL HX: . reports that she has never smoked. She has never used smokeless tobacco. She reports that she does not drink alcohol and does not use drugs.   Current Outpatient Medications:  .  Alum & Mag Hydroxide-Simeth (ANTACID ANTI-GAS PO), Take by mouth., Disp: , Rfl:  .  amLODipine (NORVASC) 2.5 MG tablet, Take 1 tablet (2.5 mg total) by mouth daily., Disp: 90 tablet, Rfl: 3 .  B Complex-C (SUPER B COMPLEX PO), Take 1 capsule by mouth., Disp: , Rfl:  .  benzonatate (TESSALON PERLES) 100 MG capsule, Take 2 capsules (200 mg total) by mouth 3 (three) times daily as needed for cough., Disp: 20 capsule, Rfl: 0 .  Calcium Carbonate-Vitamin D (CALCIUM-VITAMIN D) 500-200 MG-UNIT per tablet, Take 3 tablets by mouth daily. , Disp: , Rfl:  .  desonide (DESOWEN) 0.05 % cream, Apply topically 2 (two) times daily., Disp: 30 g, Rfl: 0 .  DIGESTIVE AIDS MIXTURE PO, Take by mouth., Disp: , Rfl:  .  fluocinonide gel (LIDEX) 0.05 %, ,  Disp: , Rfl:  .  furosemide (LASIX) 20 MG tablet, Take 1 tablet (20 mg total) by mouth daily., Disp: 90 tablet, Rfl: 3 .  Glucosamine HCl 1000 MG TABS, Take 1 tablet by mouth., Disp: , Rfl:  .  levothyroxine (SYNTHROID) 100 MCG tablet, Take 1 tablet (100 mcg total) by mouth daily., Disp: 90 tablet, Rfl: 3 .  Magnesium 250 MG TABS, Take 500 mg by mouth. , Disp: , Rfl:  .  Multiple Vitamins-Minerals (PRESERVISION AREDS PO), Take 1 tablet by mouth daily. , Disp: , Rfl:  .  Omega-3 Fatty Acids (FISH OIL) 1000 MG CPDR, Take 3,000 mg by mouth daily. , Disp: , Rfl:  .  pantoprazole (PROTONIX) 40 MG tablet, TAKE 1  TABLET TWICE DAILY (Patient taking differently: Take 40 mg by mouth 2 (two) times daily. ), Disp: 180 tablet, Rfl: 1 .  POTASSIUM PO, Take 600 mg by mouth., Disp: , Rfl:  .  Probiotic Product (TRUBIOTICS PO), Take 1 capsule by mouth., Disp: , Rfl:  .  rosuvastatin (CRESTOR) 10 MG tablet, Take 1 tablet (10 mg total) by mouth daily., Disp: 90 tablet, Rfl: 3 .  azithromycin (ZITHROMAX) 500 MG tablet, Take 1 tablet (500 mg total) by mouth daily., Disp: 7 tablet, Rfl: 0 .  predniSONE (DELTASONE) 10 MG tablet, 6 tablets on Day 1 , then reduce by 1 tablet daily until gone. DO NOT START FOR 48 HOURS, Disp: 21 tablet, Rfl: 0  EXAM:  VITALS per patient if applicable:  GENERAL: alert, oriented, appears well and in no acute distress  HEENT: atraumatic, conjunttiva clear, no obvious abnormalities on inspection of external nose and ears  NECK: normal movements of the head and neck  LUNGS: on inspection no signs of respiratory distress, breathing rate appears normal, no obvious gross SOB, gasping or wheezing  CV: no obvious cyanosis  MS: moves all visible extremities without noticeable abnormality  PSYCH/NEURO: pleasant and cooperative, no obvious depression or anxiety, speech and thought processing grossly intact  ASSESSMENT AND PLAN:  Discussed the following assessment and plan:  Cough - Plan: DG Chest 2 View  Lower respiratory tract infection due to COVID-19 virus  Lower respiratory tract infection due to COVID-19 virus COVID was diagnosed on August  25 and treated with 5 days of Remdesivir from August 27 to Sept 2.  Recurrence of fevers and cough productive of purulent sputum concerning for post COVID bacterial PNA>  Sending to [facility] for chest x ay.  Empiric  azithromycin 500 mg daily x 7 days,  Repeat steroid taper to start after 48 hours if not feeling better.     I discussed the assessment and treatment plan with the patient. The patient was provided an opportunity to ask questions and  all were answered. The patient agreed with the plan and demonstrated an understanding of the instructions.   The patient was advised to call back or seek an in-person evaluation if the symptoms worsen or if the condition fails to improve as anticipated.  I provided 30 minutes of face-to-face time during this encounter.   Crecencio Mc, MD

## 2020-07-24 NOTE — Telephone Encounter (Signed)
Caller states she is COVID positive and has pneumonia and it has been 19 days. No fever. Coughing is not getting better, but is productive. O2 is 96-98. No difficulty breathing but fatigue                 [place] [facility] RECORD [facility]urse Patient Name: CHISTINE DEMATTEO Gender: Female DOB: Oct 25, 1941 Age: 79 Y 23 D Return Phone Number: [phone] (Primary) Address: City/State/Zip: [place] [address] Client [facility] Night - Cl Client Site [place] Physician [name] - MD Contact Type Call Who Is Calling Patient / Member / Family / Caregiver Call Type Triage / Clinical Relationship To Patient Self Return Phone Number [phone] (Primary) Chief Complaint Cough Reason for Call Symptomatic / Request for Health Information Initial Comment Caller has Covid and needs meds has a severe cough has pneumonia as well No fever. Has a rattle in her chest Translation No Nurse Assessment Nurse: Valentino Nose, RN, [place] Date/Time (Eastern Time): 07/23/2020 11:53:53 AM Confirm and document reason for call. If symptomatic, describe symptoms. ---Caller states she is COVID positive and has pneumonia and it has been 19 days. No fever. Coughing is not getting better, but is productive. O2 is 96-98. No difficulty breathing but fatigue. Has the patient had close contact with a person known or suspected to have the novel coronavirus illness OR traveled / lives in area with major community spread (including international travel) in the last 14 days from the onset of symptoms? * If Asymptomatic, screen for exposure and travel within the last 14 days. ---Yes Does the patient have any new or worsening symptoms? ---Yes Will a triage be completed? ---Yes Related visit to physician within the last 2 weeks? ---No Does the PT have any chronic conditions? (i.e.  diabetes, asthma, this includes High risk factors for pregnancy, etc.) ---No Is this a behavioral health or substance abuse call? ---No Guidelines Guideline Title Affirmed Question Affirmed Notes Nurse Date/Time (Eastern Time) COVID-19 - Diagnosed or Suspected HIGH RISK for severe COVID complications (e.g., age > 88 years, obesity with BMI > 25, pregnant, chronic lung disease or other chronic medical condition) Valentino Nose, RN, [place] 07/23/2020 11:55:54 AM PLEASE NOTE: All timestamps contained within this report are represented as [place] Standard Time. CONFIDENTIALTY NOTICE: This fax transmission is intended only for the addressee. It contains information that is legally privileged, confidential or otherwise protected from use or disclosure. If you are not the intended recipient, you are strictly prohibited from reviewing, disclosing, copying using or disseminating any of this information or taking any action in reliance on or regarding this information. If you have received this fax in error, please notify us immediately by telephone so that we can arrange for its return to [name]. Phone: [phone], Toll-Free: [phone], Fax: [phone] Page: 2 of 2 Call Id: [id number] Guidelines Guideline Title Affirmed Question Affirmed Notes Nurse Date/Time Eilene Ghazi Time) (Exception: Already seen by PCP and no new or worsening symptoms.) Disp. Time Eilene Ghazi Time) Disposition Final User 07/23/2020 12:02:39 PM Called On-Call Provider Valentino Nose, RN, Marye Round 07/23/2020 11:59:33 AM Call PCP Now Yes Valentino Nose, RN, Arnetha Courser Disagree/Comply Comply Caller Understands Yes PreDisposition Call Doctor Care Advice Given Per Guideline CALL PCP NOW: * I'll page the on-call provider now. If you haven't heard from the provider (or me) within 30 minutes, call again. CALL BACK IF: * You become worse Comments User: Laverna Peace, RN Date/Time Eilene Ghazi Time): 07/23/2020 12:05:57 PM  Notified caller of OCP's  advice. Paging DoctorName Phone DateTime Result/Outcome Message Type Notes Shanon Ace - MD [phone] 07/23/2020 12:02:39 PM Called On Call Provider - Reached Doctor Paged Shanon Ace - MD 07/23/2020 12:04:18 PM Spoke with On Call - General Message Result OCP wants pt to continue monitoring O2 levels and if it gets in the low 90s or has SOB to go back to the ER and follow up with office in the morning.

## 2020-07-24 NOTE — Telephone Encounter (Signed)
Noted.  She needs follow-up for this.  She saw Cathy Coffey previously and could be scheduled with her again for a virtual visit.

## 2020-07-25 ENCOUNTER — Ambulatory Visit
Admission: RE | Admit: 2020-07-25 | Discharge: 2020-07-25 | Disposition: A | Discharge status: Home / Self Care | Payer: Medicare HMO | Source: Ambulatory Visit | Attending: Internal Medicine | Admitting: Internal Medicine

## 2020-07-25 ENCOUNTER — Other Ambulatory Visit: Payer: Self-pay

## 2020-07-25 ENCOUNTER — Ambulatory Visit
Admission: RE | Admit: 2020-07-25 | Discharge: 2020-07-25 | Disposition: A | Discharge status: Home / Self Care | Payer: Medicare HMO | Attending: Internal Medicine | Admitting: Internal Medicine

## 2020-07-25 DIAGNOSIS — Z8616 Personal history of COVID-19: Secondary | ICD-10-CM | POA: Insufficient documentation

## 2020-07-25 DIAGNOSIS — R05 Cough: Secondary | ICD-10-CM | POA: Insufficient documentation

## 2020-07-25 DIAGNOSIS — R059 Cough, unspecified: Secondary | ICD-10-CM

## 2020-07-25 DIAGNOSIS — R509 Fever, unspecified: Secondary | ICD-10-CM | POA: Insufficient documentation

## 2020-08-01 ENCOUNTER — Telehealth: Payer: Self-pay | Admitting: Family Medicine

## 2020-08-01 DIAGNOSIS — E039 Hypothyroidism, unspecified: Secondary | ICD-10-CM

## 2020-08-01 NOTE — Telephone Encounter (Signed)
Pt would like to have labs prior to hospital f/u. She states that she would like her thyroid checked. Please advise

## 2020-08-01 NOTE — Telephone Encounter (Signed)
I called and spoke with the patient and scheduled a lab appointment.  Warden Buffa,cma

## 2020-08-01 NOTE — Telephone Encounter (Signed)
TSH ordered.

## 2020-08-02 ENCOUNTER — Other Ambulatory Visit: Payer: Self-pay

## 2020-08-02 ENCOUNTER — Other Ambulatory Visit (INDEPENDENT_AMBULATORY_CARE_PROVIDER_SITE_OTHER): Payer: Medicare HMO

## 2020-08-02 DIAGNOSIS — E039 Hypothyroidism, unspecified: Secondary | ICD-10-CM

## 2020-08-02 LAB — TSH: TSH: 0.76 u[IU]/mL (ref 0.35–4.50)

## 2020-08-09 ENCOUNTER — Other Ambulatory Visit: Payer: Self-pay

## 2020-08-09 ENCOUNTER — Ambulatory Visit (INDEPENDENT_AMBULATORY_CARE_PROVIDER_SITE_OTHER): Payer: Medicare HMO | Admitting: Family Medicine

## 2020-08-09 ENCOUNTER — Encounter: Payer: Self-pay | Admitting: Family Medicine

## 2020-08-09 VITALS — BP 140/80 | HR 71 | Temp 98.3°F | Ht 61.5 in | Wt 166.8 lb

## 2020-08-09 DIAGNOSIS — G4733 Obstructive sleep apnea (adult) (pediatric): Secondary | ICD-10-CM | POA: Diagnosis not present

## 2020-08-09 DIAGNOSIS — E876 Hypokalemia: Secondary | ICD-10-CM | POA: Diagnosis not present

## 2020-08-09 DIAGNOSIS — R55 Syncope and collapse: Secondary | ICD-10-CM

## 2020-08-09 DIAGNOSIS — U071 COVID-19: Secondary | ICD-10-CM

## 2020-08-09 DIAGNOSIS — Z23 Encounter for immunization: Secondary | ICD-10-CM

## 2020-08-09 DIAGNOSIS — R1013 Epigastric pain: Secondary | ICD-10-CM

## 2020-08-09 DIAGNOSIS — K219 Gastro-esophageal reflux disease without esophagitis: Secondary | ICD-10-CM

## 2020-08-09 DIAGNOSIS — J22 Unspecified acute lower respiratory infection: Secondary | ICD-10-CM | POA: Diagnosis not present

## 2020-08-09 MED ORDER — PANTOPRAZOLE SODIUM 40 MG PO TBEC: 40.0000 mg | DELAYED_RELEASE_TABLET | Freq: Every day | ORAL | 1 refills | Status: DC | PRN

## 2020-08-09 MED ORDER — LEVOTHYROXINE SODIUM 100 MCG PO TABS
100.0000 ug | ORAL_TABLET | Freq: Every day | ORAL | 3 refills | Status: DC
Start: 2020-08-09 — End: 2020-11-22

## 2020-08-09 MED ORDER — FUROSEMIDE 20 MG PO TABS: 20.0000 mg | ORAL_TABLET | Freq: Every day | ORAL | 3 refills | Status: DC | PRN

## 2020-08-09 NOTE — Patient Instructions (Signed)
Nice to see you. We will check on the sleep apnea implant and I will let you know. Please try to maintain adequate potassium intake in your diet.

## 2020-08-09 NOTE — Progress Notes (Signed)
[name], MD Phone: [phone]  Cathy Coffey is a 79 y.o. female who presents today for follow-up.  COVID-19: Patient notes she has done well after having this about a month ago.  She notes her shortness of breath has resolved.  Cough has resolved after getting a Z-Pak.  She had a follow-up chest x-ray that was negative.  She never had to take the prednisone that was prescribed by one of the other providers in our office.  She has had no chest pain.  She ended up hospitalized due to syncopal episode that was likely related to low potassium and I suspect she had a likely arrhythmia with a low potassium that contributed.  She has an appointment with cardiology this week for further evaluation of this.  She does note she has only been taking Lasix as needed.  She has increased potassium rich foods in her diet and is eating more carbohydrates that she was previously.  OSA: Patient's been wearing her CPAP though has trouble tolerating the mask.  She noted her neurologist mentioned a sleep apnea implant and she wondered about that.  GERD: Patient notes rarely having reflux symptoms.  She only takes Protonix on an as-needed basis.  Social History   Tobacco Use  Smoking Status Never Smoker  Smokeless Tobacco Never Used     ROS see history of present illness  Objective  Physical Exam Vitals:   08/09/20 1139  BP: 140/80  Pulse: 71  Temp: 98.3 F (36.8 C)  SpO2: 97%    BP Readings from Last 3 Encounters:  08/10/20 118/70  08/09/20 140/80  07/24/20 122/75   Wt Readings from Last 3 Encounters:  08/10/20 168 lb 2 oz (76.3 kg)  08/09/20 166 lb 12.8 oz (75.7 kg)  07/24/20 165 lb (74.8 kg)    Physical Exam Constitutional:      General: She is not in acute distress.    Appearance: She is not diaphoretic.  Cardiovascular:     Rate and Rhythm: Normal rate and regular rhythm.     Heart sounds: Normal heart sounds.  Pulmonary:     Effort: Pulmonary effort is normal.      Breath sounds: Normal breath sounds.  Musculoskeletal:     Right lower leg: No edema.     Left lower leg: No edema.  Skin:    General: Skin is warm and dry.  Neurological:     Mental Status: She is alert.      Assessment/Plan: Please see individual problem list.  Lower respiratory tract infection due to COVID-19 virus Patient has fully recovered at this time.  I encouraged her to monitor for any recurrent symptoms.  GERD (gastroesophageal reflux disease) Rarely needs Protonix.  She can continue this on an as-needed basis.  If she has worsening symptoms she can let us know.  Syncope and collapse I suspect her low potassium contributed to an arrhythmia that caused the syncope.  I encouraged her to keep her appointment with cardiology as she would likely need a ZIO monitor.  Hypokalemia Most recent potassium has been in the normal range.  Encouraged continued adequate dietary potassium intake.  We will periodically monitor this.  OSA (obstructive sleep apnea) Reviewed sleep apnea implant information.  I will have the [place] contact the patient and let her know to reach out to her neurologist to see if this would be an option for her.   Orders Placed This Encounter  Procedures  . Flu Vaccine QUAD High Dose(Fluad)  Meds ordered this encounter  Medications  . pantoprazole (PROTONIX) 40 MG tablet    Sig: Take 1 tablet (40 mg total) by mouth daily as needed.    Dispense:  30 tablet    Refill:  1  . levothyroxine (SYNTHROID) 100 MCG tablet    Sig: Take 1 tablet (100 mcg total) by mouth daily.    Dispense:  90 tablet    Refill:  3  . furosemide (LASIX) 20 MG tablet    Sig: Take 1 tablet (20 mg total) by mouth daily as needed for fluid or edema.    Dispense:  30 tablet    Refill:  3    Cathy Coffey was seen today for follow-up.  Diagnoses and all orders for this visit:  Need for immunization against influenza -     Flu Vaccine QUAD High Dose(Fluad)  Abdominal pain,  epigastric -     pantoprazole (PROTONIX) 40 MG tablet; Take 1 tablet (40 mg total) by mouth daily as needed.  Lower respiratory tract infection due to COVID-19 virus  Gastroesophageal reflux disease, unspecified whether esophagitis present  Syncope and collapse  Hypokalemia  OSA (obstructive sleep apnea)  Other orders -     levothyroxine (SYNTHROID) 100 MCG tablet; Take 1 tablet (100 mcg total) by mouth daily. -     furosemide (LASIX) 20 MG tablet; Take 1 tablet (20 mg total) by mouth daily as needed for fluid or edema.     This visit occurred during the SARS-CoV-2 public health emergency.  Safety protocols were in place, including screening questions prior to the visit, additional usage of staff PPE, and extensive cleaning of exam room while observing appropriate contact time as indicated for disinfecting solutions.    [name], MD [place]

## 2020-08-10 ENCOUNTER — Telehealth: Payer: Self-pay

## 2020-08-10 ENCOUNTER — Encounter: Payer: Self-pay | Admitting: Cardiovascular Disease

## 2020-08-10 ENCOUNTER — Ambulatory Visit (INDEPENDENT_AMBULATORY_CARE_PROVIDER_SITE_OTHER): Payer: Medicare HMO | Admitting: Cardiovascular Disease

## 2020-08-10 VITALS — BP 118/70 | HR 70 | Ht 60.0 in | Wt 168.1 lb

## 2020-08-10 DIAGNOSIS — R55 Syncope and collapse: Secondary | ICD-10-CM

## 2020-08-10 DIAGNOSIS — I447 Left bundle-branch block, unspecified: Secondary | ICD-10-CM | POA: Diagnosis not present

## 2020-08-10 DIAGNOSIS — I10 Essential (primary) hypertension: Secondary | ICD-10-CM | POA: Diagnosis not present

## 2020-08-10 NOTE — Assessment & Plan Note (Signed)
Reviewed sleep apnea implant information.  I will have the [facility] contact the patient and let her know to reach out to her neurologist to see if this would be an option for her.

## 2020-08-10 NOTE — Assessment & Plan Note (Signed)
Most recent potassium has been in the normal range.  Encouraged continued adequate dietary potassium intake.  We will periodically monitor this.

## 2020-08-10 NOTE — Patient Instructions (Signed)
Medication Instructions:  Your physician recommends that you continue on your current medications as directed. Please refer to the Current Medication list given to you today.  *If you need a refill on your cardiac medications before your next appointment, please call your pharmacy*   Lab Work: None ordered If you have labs (blood work) drawn today and your tests are completely normal, you will receive your results only by: Marland Kitchen MyChart Message (if you have MyChart) OR . A paper copy in the mail If you have any lab test that is abnormal or we need to change your treatment, we will call you to review the results.   Testing/Procedures: None ordered   Follow-Up: At [facility], you and your health needs are our priority.  As part of our continuing mission to provide you with exceptional heart care, we have created designated Provider Care Teams.  These Care Teams include your primary Cardiologist (physician) and Advanced Practice Providers (APPs -  Physician Assistants and Nurse Practitioners) who all work together to provide you with the care you need, when you need it.  We recommend signing up for the patient portal called "MyChart".  Sign up information is provided on this After Visit Summary.  MyChart is used to connect with patients for Virtual Visits (Telemedicine).  Patients are able to view lab/test results, encounter notes, upcoming appointments, etc.  Non-urgent messages can be sent to your provider as well.   To learn more about what you can do with MyChart, go to [email or web address].    Your next appointment:   As needed   The format for your next appointment:   In Person  Provider:   You may see Dr. Fletcher Anon or one of the following Advanced Practice Providers on your designated Care Team:    Murray Hodgkins, NP  Christell Faith, PA-C  Marrianne Mood, PA-C  Cadence Kathlen Mody, [place]    Other Instructions N/A

## 2020-08-10 NOTE — Assessment & Plan Note (Signed)
I suspect her low potassium contributed to an arrhythmia that caused the syncope.  I encouraged her to keep her appointment with cardiology as she would likely need a ZIO monitor.

## 2020-08-10 NOTE — Telephone Encounter (Signed)
I called and informed the patient that the provider stated she should get the sleep apnea implant and I informed her to call her neurologist to set this up and she understood.  Britton Bera,cma

## 2020-08-10 NOTE — Progress Notes (Signed)
Cardiology Office Note   Date:  08/10/2020   ID:  Cathy Coffey, Cathy Coffey 05-30-1941, MRN [id number]  PCP:  Leone Haven, MD  Cardiologist:   Kathlyn Sacramento, MD   Chief Complaint  Patient presents with  . New Patient (Initial Visit)    [facility]; COVID, pneumonia, syncope & LBBB. Meds reviewed by the pt. verbally. "doing well since the discharge from [place]."       History of Present Illness: Cathy Coffey is a 79 y.o. female who presents for a follow-up visit regarding recent syncope and left bundle branch block. She has known history of left bundle branch block, essential hypertension and hypothyroidism.  She had a previous Lexiscan Myoview in 2019 which showed no evidence of ischemia with normal ejection fraction.  She was hospitalized at [facility] late August due to COVID-19 infection.  Her symptoms included vomiting and syncope.  In the emergency chrome, they reported an episode of decreased responsiveness with a pause on the monitor.  CPR was done for 10 seconds.  High-sensitivity troponin was normal.  She was hypokalemic.  Telemetry showed intermittent sinus tachycardia and mild bradycardia.  It was felt that the episode was vasovagal in the setting of GI symptoms.  She had an echocardiogram done which showed an EF of 55 to 40%, grade 2 diastolic dysfunction, indeterminate pulmonary pressure and mildly dilated left atrium.  There was also mild to moderate mitral regurgitation.  She was treated with remdesivir and then steroid taper.  She reports that she is back to her normal self.  She denies any chest pain, shortness of breath or palpitations.  No dizziness, syncope or presyncope.  Past Medical History:  Diagnosis Date  . Asthma   . Diverticulosis   . Hypertension   . Hypothyroidism   . IBS (irritable bowel syndrome)   . Lichen planus   . Pancreatitis due to common bile duct stone 2000    Past Surgical History:  Procedure Laterality Date  . CHOLECYSTECTOMY    . COLONOSCOPY   2011  . HERNIA REPAIR    . MOUTH SURGERY  09/2016  . TUBAL LIGATION       Current Outpatient Medications  Medication Sig Dispense Refill  . amLODipine (NORVASC) 2.5 MG tablet Take 1 tablet (2.5 mg total) by mouth daily. 90 tablet 3  . B Complex-C (SUPER B COMPLEX PO) Take 1 capsule by mouth.    . Calcium Carbonate-Vitamin D (CALCIUM-VITAMIN D) 500-200 MG-UNIT per tablet Take 3 tablets by mouth daily.     Marland Kitchen desonide (DESOWEN) 0.05 % cream Apply topically 2 (two) times daily. 30 g 0  . DIGESTIVE AIDS MIXTURE PO Take by mouth.    . fluocinonide gel (LIDEX) 0.05 %     . furosemide (LASIX) 20 MG tablet Take 1 tablet (20 mg total) by mouth daily as needed for fluid or edema. 30 tablet 3  . Glucosamine HCl 1000 MG TABS Take 1 tablet by mouth.    . levothyroxine (SYNTHROID) 100 MCG tablet Take 1 tablet (100 mcg total) by mouth daily. 90 tablet 3  . Magnesium 250 MG TABS Take 500 mg by mouth.     . Multiple Vitamins-Minerals (PRESERVISION AREDS PO) Take 1 tablet by mouth daily.     . Omega-3 Fatty Acids (FISH OIL) 1000 MG CPDR Take 3,000 mg by mouth daily.     . pantoprazole (PROTONIX) 40 MG tablet Take 1 tablet (40 mg total) by mouth daily as needed. 30 tablet 1  .  POTASSIUM PO Take 600 mg by mouth.    . Probiotic Product (TRUBIOTICS PO) Take 1 capsule by mouth.    . rosuvastatin (CRESTOR) 10 MG tablet Take 1 tablet (10 mg total) by mouth daily. 90 tablet 3   No current facility-administered medications for this visit.    Allergies:   Augmentin [amoxicillin-pot clavulanate]    Social History:  The patient  reports that she has never smoked. She has never used smokeless tobacco. She reports that she does not drink alcohol and does not use drugs.   Family History:  The patient's family history includes Dementia in her father and paternal grandmother; Hypertension in her mother; Hypothyroidism in her mother.    ROS:  Please see the history of present illness.   Otherwise, review of systems  are positive for none.   All other systems are reviewed and negative.    PHYSICAL EXAM: VS:  BP 118/70 (BP Location: Right Arm, Patient Position: Sitting, Cuff Size: Normal)   Pulse 70   Ht 5' (1.524 m)   Wt 168 lb 2 oz (76.3 kg)   SpO2 98%   BMI 32.83 kg/m  , BMI Body mass index is 32.83 kg/m. GEN: Well nourished, well developed, in no acute distress  HEENT: normal  Neck: no JVD, carotid bruits, or masses Cardiac: RRR; no murmurs, rubs, or gallops,no edema  Respiratory:  clear to auscultation bilaterally, normal work of breathing GI: soft, nontender, nondistended, + BS MS: no deformity or atrophy  Skin: warm and dry, no rash Neuro:  Strength and sensation are intact Psych: euthymic mood, full affect   EKG:  EKG is ordered today. The ekg ordered today demonstrates normal sinus rhythm with left bundle branch block.   Recent Labs: 07/09/2020: B Natriuretic Peptide 22.0; Hemoglobin 13.2; Platelets 134 07/10/2020: ALT 22 07/16/2020: BUN 14; Creatinine, Ser 0.71; Magnesium 2.5; Potassium 4.7; Sodium 141 08/02/2020: TSH 0.76    Lipid Panel    Component Value Date/Time   CHOL 182 12/14/2019 0956   TRIG 167.0 (H) 12/14/2019 0956   HDL 46.70 12/14/2019 0956   CHOLHDL 4 12/14/2019 0956   VLDL 33.4 12/14/2019 0956   LDLCALC 102 (H) 12/14/2019 0956   LDLDIRECT 54.0 01/24/2020 0954      Wt Readings from Last 3 Encounters:  08/10/20 168 lb 2 oz (76.3 kg)  08/09/20 166 lb 12.8 oz (75.7 kg)  07/24/20 165 lb (74.8 kg)        PAD Screen 08/10/2020  Previous PAD dx? No  Previous surgical procedure? No  Pain with walking? No  Feet/toe relief with dangling? No  Painful, non-healing ulcers? No  Extremities discolored? No      ASSESSMENT AND PLAN:  1.  Recent syncope in the setting of COVID-19 infection with significant GI symptoms and hypokalemia: Suspect vasovagal episode.  Echo showed normal LV systolic function.  Given the lack of recurrent episodes, no need for further  work-up at the present time.  If she gets recurrent symptoms, outpatient monitor should be considered.  2.  Essential hypertension: Blood pressure is controlled and she is not orthostatic today.  She uses furosemide as needed and has not used it since her hospitalization.  Previous use might have contributed to hypokalemia.  3.  Left bundle branch block: This is chronic with normal ejection fraction.  Normal stress test in 2019.    Disposition:   FU with as needed if she develops recurrent cardiac symptoms.  Signed,  Kathlyn Sacramento, MD  08/10/2020 9:03  AM    [place]

## 2020-08-10 NOTE — Assessment & Plan Note (Signed)
Rarely needs Protonix.  She can continue this on an as-needed basis.  If she has worsening symptoms she can let us know.

## 2020-08-10 NOTE — Telephone Encounter (Signed)
-----   Message from Leone Haven, MD sent at 08/10/2020  9:14 AM EDT ----- Please let the patient know that I think it would be reasonable to consider the sleep apnea implant that she asked about.  She should reach out to her neurologist as they could help coordinate referral to the appropriate specialist if they felt it was an adequate option for her.

## 2020-08-10 NOTE — Assessment & Plan Note (Signed)
Patient has fully recovered at this time.  I encouraged her to monitor for any recurrent symptoms.

## 2020-08-16 ENCOUNTER — Telehealth: Payer: Self-pay

## 2020-08-16 NOTE — Telephone Encounter (Signed)
Noted.  Nothing to do at this time though she does need to keep an eye on her blood pressure and if it trends down to the 90/60 range or less she needs to let us know.

## 2020-08-16 NOTE — Telephone Encounter (Signed)
Noted.  90/60 is the lower limit of normal.  Anything below this is technically low.  Has she been having any lightheadedness, chest pain, or palpitations?  Is she taking her Lasix?  How much fluid is she drinking?

## 2020-08-16 NOTE — Telephone Encounter (Signed)
I called the patient back and she stated she is not having any lightheadedness, chest pain or palpitations, she is not taking lasix and she is drinking 56-62 ounces of water daily.  Her last BP check was 111/68.  Krist Rosenboom,cma

## 2020-10-11 DIAGNOSIS — U071 COVID-19: Secondary | ICD-10-CM

## 2020-10-11 HISTORY — DX: COVID-19: U07.1

## 2020-10-23 ENCOUNTER — Encounter: Payer: Self-pay | Admitting: Family Medicine

## 2020-10-23 ENCOUNTER — Ambulatory Visit
Admission: EM | Admit: 2020-10-23 | Discharge: 2020-10-23 | Disposition: A | Discharge status: Home / Self Care | Payer: Medicare HMO | Attending: Family Medicine | Admitting: Family Medicine

## 2020-10-23 ENCOUNTER — Telehealth: Payer: Self-pay

## 2020-10-23 DIAGNOSIS — J0100 Acute maxillary sinusitis, unspecified: Secondary | ICD-10-CM | POA: Diagnosis not present

## 2020-10-23 DIAGNOSIS — Z7689 Persons encountering health services in other specified circumstances: Secondary | ICD-10-CM | POA: Diagnosis not present

## 2020-10-23 MED ORDER — DOXYCYCLINE HYCLATE 100 MG PO CAPS: 100.0000 mg | ORAL_CAPSULE | Freq: Two times a day (BID) | ORAL | 0 refills | Status: DC

## 2020-10-23 NOTE — ED Triage Notes (Signed)
Pt presents to Urgent Care with c/o nasal congestion and cough x approx 10 days. Pt states she had COVID in September 2021; has not been vaccinated.

## 2020-10-23 NOTE — ED Provider Notes (Signed)
Cathy Coffey    CSN: [id number] Arrival date & time: 10/23/20  0175      History   Chief Complaint Chief Complaint  Patient presents with  . Nasal Congestion  . Cough    HPI Cathy Coffey is a 79 y.o. female.   Patient is a 79 year old female who presents today with nasal congestion, rhinorrhea, fatigue, sinus pressure, cough x10 days.  Symptoms have not improved at all.  She has been doing Nettie pot, nasal spray and allergy medication without any relief.  Denies any fevers, chills.  Had Covid in September.      Past Medical History:  Diagnosis Date  . Asthma   . COVID   . Diverticulosis   . Hypertension   . Hypothyroidism   . IBS (irritable bowel syndrome)   . Lichen planus   . 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 Lower respiratory tract infection due to COVID-19 virus 07/13/2020  . Pneumonia due to COVID-19 virus 07/09/2020  . Syncope and collapse   . Nausea vomiting and diarrhea   . Hypokalemia   . Depression 10/26/2018  . Left bundle branch block 02/18/2018  . GERD (gastroesophageal reflux disease) 11/13/2017  . Osteoporosis 09/02/2017  . Chronic knee pain 01/29/2017  . OSA (obstructive sleep apnea) 01/29/2017  . Tiredness 01/29/2017  . Memory difficulty 11/21/2016  . Atrophic vaginitis 05/13/2016  . Lumbago 02/27/2015  . Toenail deformity 02/27/2015  . NSAID long-term use 11/21/2014  . Hot flashes 04/22/2014  . IBS (irritable bowel syndrome) 07/27/2013  . Screening for breast cancer 07/27/2013  . Hypertension 07/18/2011  . Hypothyroidism 07/18/2011    Past Surgical History:  Procedure Laterality Date  . CHOLECYSTECTOMY    . COLONOSCOPY  2011  . HERNIA REPAIR    . MOUTH SURGERY  09/2016  . TUBAL LIGATION      OB History   No obstetric history on file.      Home Medications    Prior to Admission medications   Medication Sig Start Date End Date Taking?  Authorizing Provider  B Complex-C (SUPER B COMPLEX PO) Take 1 capsule by mouth.    [provider]  Calcium Carbonate-Vitamin D (CALCIUM-VITAMIN D) 500-200 MG-UNIT per tablet Take 3 tablets by mouth daily.     [provider]  desonide (DESOWEN) 0.05 % cream Apply topically 2 (two) times daily. 06/03/16   Jackolyn Confer, MD  DIGESTIVE AIDS MIXTURE PO Take by mouth.    [provider]  doxycycline (VIBRAMYCIN) 100 MG capsule Take 1 capsule (100 mg total) by mouth 2 (two) times daily. 10/23/20   Loura Halt A, NP  fluocinonide gel (LIDEX) 0.05 %  04/07/19   [provider]  furosemide (LASIX) 20 MG tablet Take 1 tablet (20 mg total) by mouth daily as needed for fluid or edema. 08/09/20   Leone Haven, MD  Glucosamine HCl 1000 MG TABS Take 1 tablet by mouth.    [provider]  levothyroxine (SYNTHROID) 100 MCG tablet Take 1 tablet (100 mcg total) by mouth daily. 08/09/20   Leone Haven, MD  Magnesium 250 MG TABS Take 500 mg by mouth.     [provider]  Multiple Vitamins-Minerals (PRESERVISION AREDS PO) Take 1 tablet by mouth daily.     [provider]  Omega-3 Fatty Acids (FISH OIL) 1000 MG CPDR Take 3,000 mg by mouth daily.  [provider]  pantoprazole (PROTONIX) 40 MG tablet Take 1 tablet (40 mg total) by mouth daily as needed. 08/09/20   Leone Haven, MD  POTASSIUM PO Take 600 mg by mouth.    [provider]  Probiotic Product (TRUBIOTICS PO) Take 1 capsule by mouth.    [provider]  rosuvastatin (CRESTOR) 10 MG tablet Take 1 tablet (10 mg total) by mouth daily. 12/23/19   Leone Haven, MD    Family History Family History  Problem Relation Age of Onset  . Hypothyroidism Mother   . Hypertension Mother   . Dementia Father   . Dementia Paternal Grandmother     Social History Social History   Tobacco Use  . Smoking status: Never Smoker  . Smokeless tobacco: Never  Used  Vaping Use  . Vaping Use: Never used  Substance Use Topics  . Alcohol use: No  . Drug use: No     Allergies   Augmentin [amoxicillin-pot clavulanate]   Review of Systems Review of Systems   Physical Exam Triage Vital Signs ED Triage Vitals  Enc Vitals Group     BP 10/23/20 0839 (!) 158/84     Pulse Rate 10/23/20 0839 81     Resp 10/23/20 0839 18     Temp 10/23/20 0839 98.2 F (36.8 C)     Temp Source 10/23/20 0839 Oral     SpO2 10/23/20 0839 98 %     Weight 10/23/20 0849 164 lb (74.4 kg)     Height 10/23/20 0849 5\' 1"  (1.549 m)     Head Circumference --      Peak Flow --      Pain Score 10/23/20 0848 0     Pain Loc --      Pain Edu? --      Excl. in [place]? --    No data found.  Updated Vital Signs BP (!) 158/84 (BP Location: Left Arm)   Pulse 81   Temp 98.2 F (36.8 C) (Oral)   Resp 18   Ht 5\' 1"  (1.549 m)   Wt 164 lb (74.4 kg)   SpO2 98%   BMI 30.99 kg/m   Visual Acuity Right Eye Distance:   Left Eye Distance:   Bilateral Distance:    Right Eye Near:   Left Eye Near:    Bilateral Near:     Physical Exam Vitals and nursing note reviewed.  Constitutional:      General: She is not in acute distress.    Appearance: Normal appearance. She is not ill-appearing, toxic-appearing or diaphoretic.  HENT:     Head: Normocephalic.     Right Ear: Tympanic membrane and ear canal normal.     Left Ear: Tympanic membrane and ear canal normal.     Nose: Congestion present.     Mouth/Throat:     Pharynx: Oropharynx is clear.  Eyes:     Conjunctiva/sclera: Conjunctivae normal.  Pulmonary:     Effort: Pulmonary effort is normal.  Abdominal:     Palpations: Abdomen is soft.  Musculoskeletal:        General: Normal range of motion.     Cervical back: Normal range of motion.  Skin:    General: Skin is warm and dry.     Findings: No rash.  Neurological:     Mental Status: She is alert.  Psychiatric:        Mood and Affect: Mood normal.      UC  Treatments / Results  Labs (all labs ordered are listed, but only abnormal results are displayed) Labs Reviewed  RESP PANEL BY RT-PCR (FLU A&B, COVID) [id number]    EKG   Radiology No results found.  Procedures Procedures (including critical care time)  Medications Ordered in UC Medications - No data to display  Initial Impression / Assessment and Plan / UC Course  I have reviewed the triage vital signs and the nursing notes.  Pertinent labs & imaging results that were available during my care of the patient were reviewed by me and considered in my medical decision making (see chart for details).     Sinusitis Patient with 10 days or more of symptoms.  We will treat for bacterial sinusitis at this time.  Treating with antibiotics. Doxycycline as prescribed based on Augmentin allergy. Covid test pending.  Continue the over-the-counter medicines for symptoms Follow up as needed for continued or worsening symptoms  Final Clinical Impressions(s) / UC Diagnoses   Final diagnoses:  Acute non-recurrent maxillary sinusitis     Discharge Instructions     Treating you for a sinus infection.  Take the antibiotics as prescribed.  Continue the other medications for symptoms daily and as needed. Covid test pending.  Follow up as needed for continued or worsening symptoms     ED Prescriptions    Medication Sig Dispense Auth. Provider   doxycycline (VIBRAMYCIN) 100 MG capsule Take 1 capsule (100 mg total) by mouth 2 (two) times daily. 20 capsule Loura Halt A, NP     PDMP not reviewed this encounter.   Orvan July, NP 10/23/20 [phone]

## 2020-10-23 NOTE — Telephone Encounter (Signed)
Patient is currently being seen in [place].    [place] RECORD [facility]urse Patient Name: Cathy Coffey Gender: Female DOB: [birth year] Age: 79 Y 3 M 22 D Return Phone Number: [phone] (Primary) Address: City/State/Zip: [place] [address] Client [facility] Night - Cl Client Site [place] Physician [name] - MD Contact Type Call Who Is Calling Patient / Member / Family / Caregiver Call Type Triage / Clinical Relationship To Patient Self Return Phone Number [phone] (Primary) Chief Complaint Cold Symptom Reason for Call Symptomatic / Request for [place] states she thinks she has Covid. Translation No Nurse Assessment Nurse: Valentino Nose, RN, [place] Date/Time Eilene Ghazi Time): 10/22/2020 11:08:28 AM Confirm and document reason for call. If symptomatic, describe symptoms. ---Caller states she has a lot of head/sinus congestion, fever runs from 99-100 for the past 3 days, cough in the morning. States she started getting sick last Sat. and it is not progressing. States she had mild diarrhea. Does the patient have any new or worsening symptoms? ---Yes Will a triage be completed? ---Yes Related visit to physician within the last 2 weeks? ---No Does the PT have any chronic conditions? (i.e. diabetes, asthma, this includes High risk factors for pregnancy, etc.) ---No Is this a behavioral health or substance abuse call? ---No Guidelines Guideline Title Affirmed Question Affirmed Notes Nurse Date/Time (Eastern Time) COVID-19 - Diagnosed or Suspected HIGH RISK for severe COVID complications (e.g., age > 31 years, obesity with BMI > 25, pregnant, chronic lung disease or other chronic medical condition) (Exception: Already seen by PCP and no new or worsening symptoms.) Valentino Nose, RN, [place] 10/22/2020 11:11:22  AM Disp. Time Eilene Ghazi Time) Disposition Final User 10/22/2020 11:18:31 AM Called On-Call Provider Valentino Nose, RN, [place] PLEASE NOTE: All timestamps contained within this report are represented as [place] Standard Time. CONFIDENTIALTY NOTICE: This fax transmission is intended only for the addressee. It contains information that is legally privileged, confidential or otherwise protected from use or disclosure. If you are not the intended recipient, you are strictly prohibited from reviewing, disclosing, copying using or disseminating any of this information or taking any action in reliance on or regarding this information. If you have received this fax in error, please notify us immediately by telephone so that we can arrange for its return to [name]. Phone: [phone], Toll-Free: [phone], Fax: [phone] Page: 2 of 2 Call Id: [id number] 10/22/2020 11:20:01 AM Call PCP Now Yes Valentino Nose, RN, Arnetha Courser Disagree/Comply Comply Caller Understands Yes PreDisposition Call Doctor Care Advice Given Per Guideline CALL PCP NOW: Comments User: Laverna Peace, RN Date/Time Eilene Ghazi Time): 10/22/2020 11:22:56 AM Notified caller that OCP wants pt to get a COVID test today and call office for an appt tomorrow. Paging DoctorName Phone DateTime Result/Outcome Message Type Notes Howard Pouch [phone] 10/22/2020 11:18:31 AM Called On Call Provider - Reached Doctor Paged Howard Pouch 10/22/2020 11:19:43 AM Spoke with On Call - General Message Result OCP wants pt to go get a COVID today if she can and call office tomorrow for an appt to follow up.          [place] RECORD [facility] Patient Name: Cathy Coffey Gender: Female DOB: Feb 16, 1941 Age: 79 Y 3 M 21 D Return Phone Number: [phone] (Primary) Address: City/State/Zip: [place] [address] Client [facility] Night - Cl Client  Site [place] -  Night Physician [name] - MD Contact Type Call Who Is Calling Patient / Member / Family / Caregiver Call Type Triage / Clinical Relationship To Patient Self Return Phone Number [phone] (Primary) Chief Complaint Cold Symptom Reason for Call Symptomatic / Request for Health Information Initial Comment Caller states she had covid back in September, currently having sinus drainage and cold symptoms Translation No Nurse Assessment Nurse: Long, RN, [place] Date/Time (Eastern Time): 10/21/2020 1:24:06 PM Confirm and document reason for call. If symptomatic, describe symptoms. ---Caller states she had covid back in September, currently having sinus drainage, head stuffiness, and head ache, Temp 99.9(ear) Does the patient have any new or worsening symptoms? ---Yes Will a triage be completed? ---Yes Related visit to physician within the last 2 weeks? ---No Does the PT have any chronic conditions? (i.e. diabetes, asthma, this includes High risk factors for pregnancy, etc.) ---No Is this a behavioral health or substance abuse call? ---No Guidelines Guideline Title Affirmed Question Affirmed Notes Nurse Date/Time (Eastern Time) COVID-19 - Diagnosed or Suspected HIGH RISK for severe COVID complications (e.g., age > 36 years, obesity with BMI > 25, pregnant, chronic lung disease or other chronic medical condition) (Exception: Already seen by PCP and no new or worsening symptoms.) Long, RN, Erin 10/21/2020 1:25:43 PM Disp. Time Eilene Ghazi Time) Disposition Final User 10/21/2020 1:34:22 PM Paged On Call back to [facility], [place], Junie Panning 10/21/2020 1:35:15 PM Called On-Call Provider Long, RN, [place] PLEASE NOTE: All timestamps contained within this report are represented as [place] Standard Time. CONFIDENTIALTY NOTICE: This fax transmission is intended only for the addressee. It contains information that is legally privileged,  confidential or otherwise protected from use or disclosure. If you are not the intended recipient, you are strictly prohibited from reviewing, disclosing, copying using or disseminating any of this information or taking any action in reliance on or regarding this information. If you have received this fax in error, please notify us immediately by telephone so that we can arrange for its return to [name]. Phone: [phone], Toll-Free: [phone], Fax: [phone] Page: 2 of 2 Call Id: [id number] 10/21/2020 1:29:26 PM Call PCP Now Yes Long, RN, Ritta Slot Disagree/Comply Comply Caller Understands Yes PreDisposition Seth Ward Advice Given Per Guideline CALL PCP NOW: * You need to discuss this with your doctor (or NP/PA). * [place] HANDS OFTEN: Wash hands often with soap and water. After coughing or sneezing are important times. If soap and water are not available, use an alcohol-based hand sanitizer with at least 60% alcohol, covering all surfaces of your hands and rubbing them together until they feel dry. Avoid touching your eyes, nose, and mouth with unwashed hands. CALL BACK IF: * You become worse Comments User: Theodis Blaze, RN Date/Time Eilene Ghazi Time): 10/21/2020 1:36:42 PM Gave caller MD recommendations Referrals REFERRED TO PCP OFFICE Paging DoctorName Phone DateTime Result/Outcome Message Type Notes Howard Pouch [phone] 10/21/2020 1:34:22 PM Paged On Call Back to Call Center Doctor Paged Howard Pouch [phone] 10/21/2020 1:35:15 PM Called On Call Provider - Reached Doctor Paged Howard Pouch 10/21/2020 1:36:16 PM Spoke with On Call - General Message Result MD stated"as long as no urgent symptoms can wait to be seen. If patient wants to get testing started earlier can head into local pharmacy to start PCR"

## 2020-10-23 NOTE — Discharge Instructions (Addendum)
Treating you for a sinus infection.  Take the antibiotics as prescribed.  Continue the other medications for symptoms daily and as needed. Covid test pending.  Follow up as needed for continued or worsening symptoms

## 2020-10-24 ENCOUNTER — Ambulatory Visit: Payer: Medicare HMO

## 2020-10-24 LAB — SARS-COV-2, NAA 2 DAY TAT

## 2020-10-24 LAB — NOVEL CORONAVIRUS, NAA: SARS-CoV-2, NAA: NOT DETECTED

## 2020-10-27 ENCOUNTER — Telehealth: Payer: Self-pay

## 2020-10-27 MED ORDER — MOXIFLOXACIN HCL 400 MG PO TABS: 400.0000 mg | ORAL_TABLET | Freq: Every day | ORAL | 0 refills | Status: DC

## 2020-10-27 NOTE — Telephone Encounter (Signed)
I called the patient and informed her that the provider sent in a different antibiotic for her try and if it does not help I informed her she needed to go to a urgent care to be evaluated in person.  Brecklyn Galvis,cma

## 2020-10-27 NOTE — Telephone Encounter (Signed)
Noted.  Given lack of improvement on doxycycline and her Augmentin allergy we will treat with moxifloxacin.  She will take this once daily for 5 days.  If this is not beneficial she will need to be reevaluated in person at an urgent care.

## 2020-10-27 NOTE — Telephone Encounter (Signed)
Pt states that UC gave her doxycycline (VIBRAMYCIN) 100 MG capsule and that the bottle reads to take on an empty stomach. She states that it makes her feel so sick and she is not getting any better. Please advise  Avraj Lindroth,cma

## 2020-10-27 NOTE — Telephone Encounter (Signed)
Has she been taking the doxycycline consistently or did she stop it after it upset her stomach? She does not have to take it on an empty stomach. Have her symptoms improved at all while taking the doxycycline?

## 2020-10-27 NOTE — Telephone Encounter (Signed)
Pt states that UC gave her doxycycline (VIBRAMYCIN) 100 MG capsule and that the bottle reads to take on an empty stomach. She states that it makes her feel so sick and she is not getting any better. Please advise

## 2020-10-27 NOTE — Telephone Encounter (Signed)
I called and spoke with the patient and she stated she did not stop the doxycycline because it upset her stomach, she continued the medication. I informed her that per the provider she did not have to take the medicine on an empty stomach.  I also asked if her symptoms has improved and she stated not at all, she is still having fever and it is consistently around 100 never under that, she still has a cough and she stated the runny nose stopped but she is congested and when she blows her nose it is thick and almost like a egg yolk. She stated she has had 2 covid tests and you can see those in her chart.  Lynee Rosenbach,cma

## 2020-10-28 ENCOUNTER — Other Ambulatory Visit: Payer: Self-pay | Admitting: Family Medicine

## 2020-10-28 DIAGNOSIS — R1013 Epigastric pain: Secondary | ICD-10-CM

## 2020-10-30 ENCOUNTER — Encounter: Payer: Self-pay | Admitting: Emergency Medicine

## 2020-10-30 ENCOUNTER — Ambulatory Visit: Payer: Medicare HMO

## 2020-10-30 ENCOUNTER — Emergency Department
Admission: EM | Admit: 2020-10-30 | Discharge: 2020-10-30 | Disposition: A | Discharge status: Home / Self Care | Payer: Medicare HMO | Attending: Emergency Medicine | Admitting: Emergency Medicine

## 2020-10-30 ENCOUNTER — Other Ambulatory Visit: Payer: Self-pay

## 2020-10-30 DIAGNOSIS — Z79899 Other long term (current) drug therapy: Secondary | ICD-10-CM | POA: Insufficient documentation

## 2020-10-30 DIAGNOSIS — F419 Anxiety disorder, unspecified: Secondary | ICD-10-CM

## 2020-10-30 DIAGNOSIS — R112 Nausea with vomiting, unspecified: Secondary | ICD-10-CM | POA: Diagnosis not present

## 2020-10-30 DIAGNOSIS — U071 COVID-19: Secondary | ICD-10-CM | POA: Insufficient documentation

## 2020-10-30 DIAGNOSIS — E039 Hypothyroidism, unspecified: Secondary | ICD-10-CM | POA: Insufficient documentation

## 2020-10-30 DIAGNOSIS — J45909 Unspecified asthma, uncomplicated: Secondary | ICD-10-CM | POA: Insufficient documentation

## 2020-10-30 DIAGNOSIS — J1282 Pneumonia due to coronavirus disease 2019: Secondary | ICD-10-CM | POA: Diagnosis not present

## 2020-10-30 DIAGNOSIS — R110 Nausea: Secondary | ICD-10-CM

## 2020-10-30 DIAGNOSIS — R69 Illness, unspecified: Secondary | ICD-10-CM | POA: Diagnosis not present

## 2020-10-30 DIAGNOSIS — R197 Diarrhea, unspecified: Secondary | ICD-10-CM | POA: Diagnosis not present

## 2020-10-30 DIAGNOSIS — I10 Essential (primary) hypertension: Secondary | ICD-10-CM | POA: Diagnosis not present

## 2020-10-30 DIAGNOSIS — Z8616 Personal history of COVID-19: Secondary | ICD-10-CM

## 2020-10-30 DIAGNOSIS — R531 Weakness: Secondary | ICD-10-CM | POA: Diagnosis present

## 2020-10-30 LAB — CBC
HCT: 42.1 % (ref 36.0–46.0)
Hemoglobin: 13.7 g/dL (ref 12.0–15.0)
MCH: 29.4 pg (ref 26.0–34.0)
MCHC: 32.5 g/dL (ref 30.0–36.0)
MCV: 90.3 fL (ref 80.0–100.0)
Platelets: 196 10*3/uL (ref 150–400)
RBC: 4.66 MIL/uL (ref 3.87–5.11)
RDW: 13.7 % (ref 11.5–15.5)
WBC: 9.1 10*3/uL (ref 4.0–10.5)
nRBC: 0 % (ref 0.0–0.2)

## 2020-10-30 LAB — URINALYSIS, COMPLETE (UACMP) WITH MICROSCOPIC
Bacteria, UA: NONE SEEN
Bilirubin Urine: NEGATIVE
Glucose, UA: NEGATIVE mg/dL
Hgb urine dipstick: NEGATIVE
Ketones, ur: NEGATIVE mg/dL
Leukocytes,Ua: NEGATIVE
Nitrite: NEGATIVE
Protein, ur: NEGATIVE mg/dL
Specific Gravity, Urine: 1.004 — ABNORMAL LOW (ref 1.005–1.030)
Squamous Epithelial / HPF: NONE SEEN (ref 0–5)
pH: 7 (ref 5.0–8.0)

## 2020-10-30 LAB — RESP PANEL BY RT-PCR (FLU A&B, COVID) ARPGX2
Influenza A by PCR: NEGATIVE
Influenza B by PCR: NEGATIVE
SARS Coronavirus 2 by RT PCR: POSITIVE — AB

## 2020-10-30 LAB — BASIC METABOLIC PANEL
Anion gap: 11 (ref 5–15)
BUN: 23 mg/dL (ref 8–23)
CO2: 26 mmol/L (ref 22–32)
Calcium: 10 mg/dL (ref 8.9–10.3)
Chloride: 104 mmol/L (ref 98–111)
Creatinine, Ser: 0.83 mg/dL (ref 0.44–1.00)
GFR, Estimated: >60 mL/min (ref >60)
Glucose, Bld: 115 mg/dL — ABNORMAL HIGH (ref 70–99)
Potassium: 4.4 mmol/L (ref 3.5–5.1)
Sodium: 141 mmol/L (ref 135–145)

## 2020-10-30 LAB — MAGNESIUM: Magnesium: 2.5 mg/dL — ABNORMAL HIGH (ref 1.7–2.4)

## 2020-10-30 NOTE — ED Triage Notes (Signed)
PT to ER states she believes her potassium is low.  Pt states lightheadedness, nausea, weakness, and urinary frequency starting last night.  PT also states she believes she has "the omicron" because she has nasal congestion, pressure in her ears and head.  States she was tested 3 weeks ago and it was negative.  States she is taking her 2nd abx for the URI and still does not fill right.

## 2020-10-30 NOTE — Discharge Instructions (Signed)
Please check your MyChart results for your COVID-19 test results.  If this returns positive, please stay masked and quarantine until you tested negative.  Please follow-up with your PCP to discuss your continued symptoms.  It would be reasonable to discontinue your moxifloxacin due to the high risk of side effects, and low likelihood of an acute bacterial infection necessitating this medication.  Return to the ED with any fevers, passing out or significantly worsening symptoms.

## 2020-10-30 NOTE — Telephone Encounter (Signed)
Pt called and states that she is still feeling sick and she would like to have her potassium checked. I let her know that she would not be able to come into our office with symptoms of being sick. She states that she is going to go to the urgent care across the street. Please advise

## 2020-10-30 NOTE — ED Provider Notes (Signed)
[facility] Emergency Department Provider Note ____________________________________________   Event Date/Time   First MD Initiated Contact with Patient 10/30/20 1734     (approximate)  I have reviewed the triage vital signs and the nursing notes.  HISTORY  Chief Complaint Dizziness, Weakness, Urinary Frequency, Nasal Congestion, and Nausea   HPI Cathy Coffey is a 79 y.o. femalewho presents to the ED for evaluation of multiple symptoms.   Chart review indicates history of HTN, HLD, IBS and obesity. Patient seen by an NP in a local urgent care 1 week ago with complaints of 10 days of respiratory congestion, discharged with doxycycline due to documented Augmentin allergy.  Swab for Covid at that time and returns negative.  Patient reports she had COVID-19 3 months ago in September, and reports various concerns about an additional acute infection due to a variety of symptoms.  She reports a transient sensation of generalized weakness and nausea this morning that reminded her of when she had hypokalemia in the setting of COVID-19, she presents with the primary concern for hypokalemia.  She has already checked her MyChart results prior to my evaluation, and reports disappointment that this test was normal.  She further reports an episode of watery diarrhea that occurred this morning.  She denies abdominal pain, emesis.  She does report the transient sensation of nausea without emesis accompanying her diarrhea.  She reports her PCP transitioned her from doxycycline to moxifloxacin 3 days ago.  She reports reading the paperwork that came with her moxifloxacin and reports there is concerns about the multitude of side effects documented for this medication.  She denies any fevers, syncopal episodes, chest pain, productive cough, dysuria, vaginal discharge or bleeding, trauma.   Past Medical History:  Diagnosis Date  . Asthma   . COVID   . Diverticulosis   .  Hypertension   . Hypothyroidism   . IBS (irritable bowel syndrome)   . Lichen planus   . 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 Lower respiratory tract infection due to COVID-19 virus 07/13/2020  . Pneumonia due to COVID-19 virus 07/09/2020  . Syncope and collapse   . Nausea vomiting and diarrhea   . Hypokalemia   . Depression 10/26/2018  . Left bundle branch block 02/18/2018  . GERD (gastroesophageal reflux disease) 11/13/2017  . Osteoporosis 09/02/2017  . Chronic knee pain 01/29/2017  . OSA (obstructive sleep apnea) 01/29/2017  . Tiredness 01/29/2017  . Memory difficulty 11/21/2016  . Atrophic vaginitis 05/13/2016  . Lumbago 02/27/2015  . Toenail deformity 02/27/2015  . NSAID long-term use 11/21/2014  . Hot flashes 04/22/2014  . IBS (irritable bowel syndrome) 07/27/2013  . Screening for breast cancer 07/27/2013  . Hypertension 07/18/2011  . Hypothyroidism 07/18/2011    Past Surgical History:  Procedure Laterality Date  . CHOLECYSTECTOMY    . COLONOSCOPY  2011  . HERNIA REPAIR    . MOUTH SURGERY  09/2016  . TUBAL LIGATION      Prior to Admission medications   Medication Sig Start Date End Date Taking? Authorizing Provider  B Complex-C (SUPER B COMPLEX PO) Take 1 capsule by mouth.    [provider]  Calcium Carbonate-Vitamin D (CALCIUM-VITAMIN D) 500-200 MG-UNIT per tablet Take 3 tablets by mouth daily.     [provider]  desonide (DESOWEN) 0.05 % cream Apply topically 2 (two) times daily. 06/03/16   Jackolyn Confer,  MD  DIGESTIVE AIDS MIXTURE PO Take by mouth.    [provider]  doxycycline (VIBRAMYCIN) 100 MG capsule Take 1 capsule (100 mg total) by mouth 2 (two) times daily. 10/23/20   Loura Halt A, NP  fluocinonide gel (LIDEX) 0.05 %  04/07/19   [provider]  furosemide (LASIX) 20 MG tablet Take 1 tablet (20 mg total) by mouth daily as needed for fluid  or edema. 08/09/20   Leone Haven, MD  Glucosamine HCl 1000 MG TABS Take 1 tablet by mouth.    [provider]  levothyroxine (SYNTHROID) 100 MCG tablet Take 1 tablet (100 mcg total) by mouth daily. 08/09/20   Leone Haven, MD  Magnesium 250 MG TABS Take 500 mg by mouth.     [provider]  moxifloxacin (AVELOX) 400 MG tablet Take 1 tablet (400 mg total) by mouth daily. 10/27/20   Leone Haven, MD  Multiple Vitamins-Minerals (PRESERVISION AREDS PO) Take 1 tablet by mouth daily.     [provider]  Omega-3 Fatty Acids (FISH OIL) 1000 MG CPDR Take 3,000 mg by mouth daily.     [provider]  pantoprazole (PROTONIX) 40 MG tablet TAKE 1 TABLET TWICE DAILY 10/30/20   Leone Haven, MD  POTASSIUM PO Take 600 mg by mouth.    [provider]  Probiotic Product (TRUBIOTICS PO) Take 1 capsule by mouth.    [provider]  rosuvastatin (CRESTOR) 10 MG tablet Take 1 tablet (10 mg total) by mouth daily. 12/23/19   Leone Haven, MD    Allergies Augmentin [amoxicillin-pot clavulanate]  Family History  Problem Relation Age of Onset  . Hypothyroidism Mother   . Hypertension Mother   . Dementia Father   . Dementia Paternal Grandmother     Social History Social History   Tobacco Use  . Smoking status: Never Smoker  . Smokeless tobacco: Never Used  Vaping Use  . Vaping Use: Never used  Substance Use Topics  . Alcohol use: No  . Drug use: No    Review of Systems  Constitutional: No fever/chills.  Positive generalized weakness. Eyes: No visual changes. ENT: No sore throat. Cardiovascular: Denies chest pain. Respiratory: Denies shortness of breath. Gastrointestinal: No abdominal pain.  no vomiting.No constipation. Positive for nausea and diarrhea. Genitourinary: Negative for dysuria. Musculoskeletal: Negative for back pain. Skin: Negative for rash. Neurological: Negative for headaches, focal weakness or  numbness.  ____________________________________________   PHYSICAL EXAM:  VITAL SIGNS: Vitals:   10/30/20 1537 10/30/20 1739  BP: (!) 147/59 (!) 143/81  Pulse: 78 77  Resp: 20 17  Temp:    SpO2: 98% 96%     Constitutional: Alert and oriented. Well appearing and in no acute distress. Eyes: Conjunctivae are normal. PERRL. EOMI. Head: Atraumatic. Nose: No congestion/rhinnorhea. Mouth/Throat: Mucous membranes are moist.  Oropharynx non-erythematous. Neck: No stridor. No cervical spine tenderness to palpation. Cardiovascular: Normal rate, regular rhythm. Grossly normal heart sounds.  Good peripheral circulation. Respiratory: Normal respiratory effort.  No retractions. Lungs CTAB. Gastrointestinal: Soft , nondistended, nontender to palpation. No CVA tenderness. Musculoskeletal: No lower extremity tenderness nor edema.  No joint effusions. No signs of acute trauma. Neurologic:  Normal speech and language. No gross focal neurologic deficits are appreciated. No gait instability noted. Skin:  Skin is warm, dry and intact. No rash noted. Psychiatric: Anxious.  Linear thought processes.  ____________________________________________   LABS (all labs ordered are listed, but only abnormal results are displayed)  Labs Reviewed  BASIC METABOLIC PANEL - Abnormal; Notable for the following components:      Result Value   Glucose, Bld 115 (*)    All other components within normal limits  URINALYSIS, COMPLETE (UACMP) WITH MICROSCOPIC - Abnormal; Notable for the following components:   Color, Urine STRAW (*)    APPearance CLEAR (*)    Specific Gravity, Urine 1.004 (*)    All other components within normal limits  MAGNESIUM - Abnormal; Notable for the following components:   Magnesium 2.5 (*)    All other components within normal limits  RESP PANEL BY RT-PCR (FLU A&B, COVID) ARPGX2  CBC  CBG MONITORING, ED   ____________________________________________  12 Lead EKG  Sinus rhythm,  Rate of 76 bpm.  Normal axis.  Left bundle branch block without ischemic features, per sgarbossa criteria Similar to previous EKG performed on 07/2020 ____________________________________________   PROCEDURES and INTERVENTIONS  Procedure(s) performed (including Critical Care):  .1-3 Lead EKG Interpretation Performed by: Vladimir Crofts, MD Authorized by: Vladimir Crofts, MD     Interpretation: normal     ECG rate:  70   ECG rate assessment: normal     Rhythm: sinus rhythm     Ectopy: none     Conduction: normal      Medications - No data to display  ____________________________________________   MDM / ED COURSE   79 year old woman with history of COVID-19 presents with various complaints without evidence of acute pathology and amenable to outpatient management.  Normal vitals on room air.  Exam demonstrates an anxious woman who frequently relates her current symptoms to her COVID-19 infection in September.  She initially declined repeat Covid testing today, because she was tested last week and negative, but later changes her mind and requests testing.  No evidence of pathology to preclude outpatient management.  We discussed return precautions for the ED and advised patient follow-up with her PCP.  ____________________________________________   FINAL CLINICAL IMPRESSION(S) / ED DIAGNOSES  Final diagnoses:  Nausea  Diarrhea, unspecified type  Anxiety  History of COVID-19     ED Discharge Orders    None       Brigido Mera   Note:  This document was prepared using Dragon voice recognition software and may include unintentional dictation errors.   Vladimir Crofts, MD 10/30/20 [phone]

## 2020-10-30 NOTE — ED Notes (Signed)
Pt provided ice water, graham crackers and pb for PO challenge

## 2020-10-31 ENCOUNTER — Other Ambulatory Visit: Payer: Self-pay | Admitting: Family Medicine

## 2020-10-31 ENCOUNTER — Telehealth: Payer: Self-pay | Admitting: Nurse Practitioner

## 2020-10-31 ENCOUNTER — Encounter: Payer: Self-pay | Admitting: Nurse Practitioner

## 2020-10-31 ENCOUNTER — Telehealth: Payer: Self-pay | Admitting: Family Medicine

## 2020-10-31 DIAGNOSIS — J45909 Unspecified asthma, uncomplicated: Secondary | ICD-10-CM | POA: Insufficient documentation

## 2020-10-31 DIAGNOSIS — E669 Obesity, unspecified: Secondary | ICD-10-CM | POA: Insufficient documentation

## 2020-10-31 NOTE — Telephone Encounter (Signed)
I called the patient and informed her of her covid results and I asked her how she is feeling she stated that she has one antibiotic left and she is coughing up the phlegm finally, she is doing better I also informed her of the quarantine she should be doing.  She stated she is a little lightheaded and she has not appetite, but she does not eat much when she is sick.  I informed her to call us if she needs anything.  Rye Decoste,cma

## 2020-10-31 NOTE — Telephone Encounter (Signed)
Please let the patient know that her COVID test returned positive in the ED. It appears she had a negative COVID test last week so this could represent a new COVID infection compared to her previous COVID infection. She should remain quarantined until she has been at least 10 days from her positive test and has had at least 24 hours of improved symptoms with no fevers.  Can you also report this to the health department?  Please see how she is feeling now.  Thanks.

## 2020-10-31 NOTE — Telephone Encounter (Signed)
I called Deborah Chalk to discuss Covid symptoms and the use of Sotrovimab, a monoclonal antibody infusion for those with mild to moderate Covid symptoms and at a high risk of hospitalization.     Pt does not qualify for infusion therapy as pt's symptoms first presented > 10 days prior to timing of infusion (symptom onset 10/14/2020). Symptoms tier reviewed as well as criteria for ending isolation. Preventative practices reviewed. Patient verbalized understanding.     Patient Active Problem List   Diagnosis Date Noted  . Morbid obesity ([place])   . Asthma   . COVID-19 virus infection 10/2020  . Lower respiratory tract infection due to COVID-19 virus 07/13/2020  . Pneumonia due to COVID-19 virus 07/09/2020  . Syncope and collapse   . Nausea vomiting and diarrhea   . Hypokalemia   . Depression 10/26/2018  . Left bundle branch block 02/18/2018  . GERD (gastroesophageal reflux disease) 11/13/2017  . Osteoporosis 09/02/2017  . Chronic knee pain 01/29/2017  . OSA (obstructive sleep apnea) 01/29/2017  . Tiredness 01/29/2017  . Memory difficulty 11/21/2016  . Atrophic vaginitis 05/13/2016  . Lumbago 02/27/2015  . Toenail deformity 02/27/2015  . NSAID long-term use 11/21/2014  . Hot flashes 04/22/2014  . IBS (irritable bowel syndrome) 07/27/2013  . Screening for breast cancer 07/27/2013  . Hypertension 07/18/2011  . Hypothyroidism 07/18/2011    Murray Hodgkins, NP

## 2020-10-31 NOTE — Telephone Encounter (Signed)
Noted.  She is try to stay adequately hydrated and drink plenty of fluids.  She should try to eat as she is able to.  We could always complete a virtual visit with her if she would like.  Please submit this to the health department as a positive test.  Thanks.

## 2020-11-01 NOTE — Telephone Encounter (Signed)
Spoke with the patient and inforemd her to stay hydrated and drink plenty of fluids and complete her visit on Monday.  Cathy Coffey,cma

## 2020-11-03 ENCOUNTER — Other Ambulatory Visit: Payer: Self-pay | Admitting: Family Medicine

## 2020-11-03 DIAGNOSIS — I10 Essential (primary) hypertension: Secondary | ICD-10-CM

## 2020-11-06 ENCOUNTER — Ambulatory Visit (INDEPENDENT_AMBULATORY_CARE_PROVIDER_SITE_OTHER): Payer: Medicare HMO

## 2020-11-06 VITALS — Ht 61.0 in | Wt 164.0 lb

## 2020-11-06 DIAGNOSIS — Z0000 Encounter for general adult medical examination without abnormal findings: Secondary | ICD-10-CM

## 2020-11-06 NOTE — Patient Instructions (Addendum)
Cathy Coffey , Thank you for taking time to come for your Medicare Wellness Visit. I appreciate your ongoing commitment to your health goals. Please review the following plan we discussed and let me know if I can assist you in the future.   These are the goals we discussed: Goals      Patient Stated   .  I want to lower my cholesterol (pt-stated)      Increase activity as tolerated Healthy diet       This is a list of the screening recommended for you and due dates:  Health Maintenance  Topic Date Due  .  Hepatitis C: One time screening is recommended by Center for Disease Control  (CDC) for  adults born from [birth year] through 1963.   Never done  . COVID-19 Vaccine (1) 11/22/2020*  . Tetanus Vaccine  11/06/2021*  . Flu Shot  Completed  . DEXA scan (bone density measurement)  Completed  . Pneumonia vaccines  Completed  *Topic was postponed. The date shown is not the original due date.    Immunizations Immunization History  Administered Date(s) Administered  . Fluad Quad(high Dose 65+) 07/21/2019, 08/09/2020  . Influenza Split 10/01/2012  . Influenza, High Dose Seasonal PF 08/21/2016, 09/02/2017, 10/19/2018  . Influenza,inj,Quad PF,6+ Mos 07/27/2013, 11/02/2014, 11/09/2015  . Pneumococcal Conjugate-13 11/02/2014  . Pneumococcal Polysaccharide-23 10/01/2012   Keep all routine maintenance appointments.   Follow up 11/29/20 @ 1:45  Advanced directives: End of life planning; Advance aging; Advanced directives discussed.  Copy of current HCPOA/Living Will requested.    Follow up in one year for your annual wellness visit    Preventive Care 65 Years and Older, Female Preventive care refers to lifestyle choices and visits with your health care provider that can promote health and wellness. What does preventive care include?  A yearly physical exam. This is also called an annual well check.  Dental exams once or twice a year.  Routine eye exams. Ask your health care provider how  often you should have your eyes checked.  Personal lifestyle choices, including:  Daily care of your teeth and gums.  Regular physical activity.  Eating a healthy diet.  Avoiding tobacco and drug use.  Limiting alcohol use.  Practicing safe sex.  Taking low-dose aspirin every day.  Taking vitamin and mineral supplements as recommended by your health care provider. What happens during an annual well check? The services and screenings done by your health care provider during your annual well check will depend on your age, overall health, lifestyle risk factors, and family history of disease. Counseling  Your health care provider may ask you questions about your:  Alcohol use.  Tobacco use.  Drug use.  Emotional well-being.  Home and relationship well-being.  Sexual activity.  Eating habits.  History of falls.  Memory and ability to understand (cognition).  Work and work Statistician.  Reproductive health. Screening  You may have the following tests or measurements:  Height, weight, and BMI.  Blood pressure.  Lipid and cholesterol levels. These may be checked every 5 years, or more frequently if you are over 33 years old.  Skin check.  Lung cancer screening. You may have this screening every year starting at age 59 if you have a 30-pack-year history of smoking and currently smoke or have quit within the past 15 years.  Fecal occult blood test (FOBT) of the stool. You may have this test every year starting at age 20.  Flexible sigmoidoscopy or colonoscopy.  You may have a sigmoidoscopy every 5 years or a colonoscopy every 10 years starting at age 35.  Hepatitis C blood test.  Hepatitis B blood test.  Sexually transmitted disease (STD) testing.  Diabetes screening. This is done by checking your blood sugar (glucose) after you have not eaten for a while (fasting). You may have this done every 1-3 years.  Bone density scan. This is done to screen for  osteoporosis. You may have this done starting at age 62.  Mammogram. This may be done every 1-2 years. Talk to your health care provider about how often you should have regular mammograms. Talk with your health care provider about your test results, treatment options, and if necessary, the need for more tests. Vaccines  Your health care provider may recommend certain vaccines, such as:  Influenza vaccine. This is recommended every year.  Tetanus, diphtheria, and acellular pertussis (Tdap, Td) vaccine. You may need a Td booster every 10 years.  Zoster vaccine. You may need this after age 88.  Pneumococcal 13-valent conjugate (PCV13) vaccine. One dose is recommended after age 33.  Pneumococcal polysaccharide (PPSV23) vaccine. One dose is recommended after age 65. Talk to your health care provider about which screenings and vaccines you need and how often you need them. This information is not intended to replace advice given to you by your health care provider. Make sure you discuss any questions you have with your health care provider. Document Released: 11/24/2015 Document Revised: 07/17/2016 Document Reviewed: 08/29/2015 Elsevier Interactive Patient Education  2017 [place] Prevention in the Home Falls can cause injuries. They can happen to people of all ages. There are many things you can do to make your home safe and to help prevent falls. What can I do on the outside of my home?  Regularly fix the edges of walkways and driveways and fix any cracks.  Remove anything that might make you trip as you walk through a door, such as a raised step or threshold.  Trim any bushes or trees on the path to your home.  Use bright outdoor lighting.  Clear any walking paths of anything that might make someone trip, such as rocks or tools.  Regularly check to see if handrails are loose or broken. Make sure that both sides of any steps have handrails.  Any raised decks and porches  should have guardrails on the edges.  Have any leaves, snow, or ice cleared regularly.  Use sand or salt on walking paths during winter.  Clean up any spills in your garage right away. This includes oil or grease spills. What can I do in the bathroom?  Use night lights.  Install grab bars by the toilet and in the tub and shower. Do not use towel bars as grab bars.  Use non-skid mats or decals in the tub or shower.  If you need to sit down in the shower, use a plastic, non-slip stool.  Keep the floor dry. Clean up any water that spills on the floor as soon as it happens.  Remove soap buildup in the tub or shower regularly.  Attach bath mats securely with double-sided non-slip rug tape.  Do not have throw rugs and other things on the floor that can make you trip. What can I do in the bedroom?  Use night lights.  Make sure that you have a light by your bed that is easy to reach.  Do not use any sheets or blankets that are too big  for your bed. They should not hang down onto the floor.  Have a firm chair that has side arms. You can use this for support while you get dressed.  Do not have throw rugs and other things on the floor that can make you trip. What can I do in the kitchen?  Clean up any spills right away.  Avoid walking on wet floors.  Keep items that you use a lot in easy-to-reach places.  If you need to reach something above you, use a strong step stool that has a grab bar.  Keep electrical cords out of the way.  Do not use floor polish or wax that makes floors slippery. If you must use wax, use non-skid floor wax.  Do not have throw rugs and other things on the floor that can make you trip. What can I do with my stairs?  Do not leave any items on the stairs.  Make sure that there are handrails on both sides of the stairs and use them. Fix handrails that are broken or loose. Make sure that handrails are as long as the stairways.  Check any carpeting to  make sure that it is firmly attached to the stairs. Fix any carpet that is loose or worn.  Avoid having throw rugs at the top or bottom of the stairs. If you do have throw rugs, attach them to the floor with carpet tape.  Make sure that you have a light switch at the top of the stairs and the bottom of the stairs. If you do not have them, ask someone to add them for you. What else can I do to help prevent falls?  Wear shoes that:  Do not have high heels.  Have rubber bottoms.  Are comfortable and fit you well.  Are closed at the toe. Do not wear sandals.  If you use a stepladder:  Make sure that it is fully opened. Do not climb a closed stepladder.  Make sure that both sides of the stepladder are locked into place.  Ask someone to hold it for you, if possible.  Clearly mark and make sure that you can see:  Any grab bars or handrails.  First and last steps.  Where the edge of each step is.  Use tools that help you move around (mobility aids) if they are needed. These include:  Canes.  Walkers.  Scooters.  Crutches.  Turn on the lights when you go into a dark area. Replace any light bulbs as soon as they burn out.  Set up your furniture so you have a clear path. Avoid moving your furniture around.  If any of your floors are uneven, fix them.  If there are any pets around you, be aware of where they are.  Review your medicines with your doctor. Some medicines can make you feel dizzy. This can increase your chance of falling. Ask your doctor what other things that you can do to help prevent falls. This information is not intended to replace advice given to you by your health care provider. Make sure you discuss any questions you have with your health care provider. Document Released: 08/24/2009 Document Revised: 04/04/2016 Document Reviewed: 12/02/2014 Elsevier Interactive Patient Education  2017 [employer].

## 2020-11-06 NOTE — Progress Notes (Signed)
Subjective:   Cathy Coffey is a 79 y.o. female who presents for Medicare Annual (Subsequent) preventive examination.  Review of Systems    No ROS.  Medicare Wellness Virtual Visit.   Cardiac Risk Factors include: advanced age (>17men, >61 women);hypertension     Objective:    Today's Vitals   11/06/20 1250  Weight: 164 lb (74.4 kg)  Height: 5\' 1"  (1.549 m)   Body mass index is 30.99 kg/m.  Advanced Directives 11/06/2020 10/30/2020 07/09/2020 07/09/2020 10/22/2019 10/19/2018 10/16/2017  Does Patient Have a Medical Advance Directive? No No No No No No No  Would patient like information on creating a medical advance directive? No - Patient declined No - Guardian declined No - Patient declined No - Patient declined No - Patient declined Yes (MAU/Ambulatory/Procedural Areas - Information given) No - Patient declined    Current Medications (verified) Outpatient Encounter Medications as of 11/06/2020  Medication Sig  . B Complex-C (SUPER B COMPLEX PO) Take 1 capsule by mouth.  . Calcium Carbonate-Vitamin D (CALCIUM-VITAMIN D) 500-200 MG-UNIT per tablet Take 3 tablets by mouth daily.   Marland Kitchen desonide (DESOWEN) 0.05 % cream Apply topically 2 (two) times daily.  Marland Kitchen DIGESTIVE AIDS MIXTURE PO Take by mouth.  . fluocinonide gel (LIDEX) 0.05 %   . furosemide (LASIX) 20 MG tablet TAKE 1 TABLET DAILY  . Glucosamine HCl 1000 MG TABS Take 1 tablet by mouth.  . levothyroxine (SYNTHROID) 100 MCG tablet Take 1 tablet (100 mcg total) by mouth daily.  . Magnesium 250 MG TABS Take 500 mg by mouth.   . Multiple Vitamins-Minerals (PRESERVISION AREDS PO) Take 1 tablet by mouth daily.   . Omega-3 Fatty Acids (FISH OIL) 1000 MG CPDR Take 3,000 mg by mouth daily.   . pantoprazole (PROTONIX) 40 MG tablet TAKE 1 TABLET TWICE DAILY  . POTASSIUM PO Take 600 mg by mouth.  . Probiotic Product (TRUBIOTICS PO) Take 1 capsule by mouth.  . rosuvastatin (CRESTOR) 10 MG tablet Take 1 tablet (10 mg total) by mouth  daily.  . [DISCONTINUED] doxycycline (VIBRAMYCIN) 100 MG capsule Take 1 capsule (100 mg total) by mouth 2 (two) times daily.  . [DISCONTINUED] moxifloxacin (AVELOX) 400 MG tablet Take 1 tablet (400 mg total) by mouth daily.   No facility-administered encounter medications on file as of 11/06/2020.    Allergies (verified) Moxifloxacin, Augmentin [amoxicillin-pot clavulanate], and Doxycycline   History: Past Medical History:  Diagnosis Date  . Asthma   . COVID-19 virus infection 10/2020  . Diverticulosis   . Hypertension   . Hypothyroidism   . IBS (irritable bowel syndrome)   . Lichen planus   . Morbid obesity ([place])   . Pancreatitis due to common bile duct stone 2000   Past Surgical History:  Procedure Laterality Date  . CHOLECYSTECTOMY    . COLONOSCOPY  2011  . HERNIA REPAIR    . MOUTH SURGERY  09/2016  . TUBAL LIGATION     Family History  Problem Relation Age of Onset  . Hypothyroidism Mother   . Hypertension Mother   . Dementia Father   . Dementia Paternal Grandmother    Social History   Socioeconomic History  . Marital status: Divorced    Spouse name: Not on file  . Number of children: Not on file  . Years of education: Not on file  . Highest education level: Not on file  Occupational History  . Not on file  Tobacco Use  . Smoking status: Never Smoker  .  Smokeless tobacco: Never Used  Vaping Use  . Vaping Use: Never used  Substance and Sexual Activity  . Alcohol use: No  . Drug use: No  . Sexual activity: Never  Other Topics Concern  . Not on file  Social History Narrative  . Not on file   Social Determinants of Health   Financial Resource Strain: Low Risk   . Difficulty of Paying Living Expenses: Not hard at all  Food Insecurity: No Food Insecurity  . Worried About Charity fundraiser in the Last Year: Never true  . Ran Out of Food in the Last Year: Never true  Transportation Needs: No Transportation Needs  . Lack of Transportation (Medical):  No  . Lack of Transportation (Non-Medical): No  Physical Activity: Not on file  Stress: No Stress Concern Present  . Feeling of Stress : Not at all  Social Connections: Unknown  . Frequency of Communication with Friends and Family: Not on file  . Frequency of Social Gatherings with Friends and Family: More than three times a week  . Attends Religious Services: Not on file  . Active Member of Clubs or Organizations: Not on file  . Attends Archivist Meetings: Not on file  . Marital Status: Married    Tobacco Counseling Counseling given: Not Answered   Clinical Intake:  Pre-visit preparation completed: Yes        Diabetes: No  How often do you need to have someone help you when you read instructions, pamphlets, or other written materials from your doctor or pharmacy?: 1 - Never   Interpreter Needed?: No      Activities of Daily Living In your present state of health, do you have any difficulty performing the following activities: 11/06/2020 07/09/2020  Hearing? N N  Vision? N N  Difficulty concentrating or making decisions? Y N  Walking or climbing stairs? N N  Dressing or bathing? N N  Doing errands, shopping? N N  Preparing Food and eating ? N -  Using the Toilet? N -  In the past six months, have you accidently leaked urine? N -  Do you have problems with loss of bowel control? N -  Managing your Medications? N -  Managing your Finances? N -  Housekeeping or managing your Housekeeping? N -  Some recent data might be hidden    Patient Care Team: Leone Haven, MD as PCP - General (Family Medicine)  Indicate any recent Medical Services you may have received from other than [facility] providers in the past year (date may be approximate).     Assessment:   This is a routine wellness examination for Cathy Coffey.  I connected with Cathy Coffey today by telephone and verified that I am speaking with the correct person using two identifiers. Location patient:  home Location provider: work Persons participating in the virtual visit: patient, Marine scientist.    I discussed the limitations, risks, security and privacy concerns of performing an evaluation and management service by telephone and the availability of in person appointments. The patient expressed understanding and verbally consented to this telephonic visit.    Interactive audio and video telecommunications were attempted between this provider and patient, however failed, due to patient having technical difficulties OR patient did not have access to video capability.  We continued and completed visit with audio only.  Some vital signs may be absent or patient reported.   Hearing/Vision screen  Hearing Screening   125Hz  250Hz  500Hz  1000Hz  2000Hz  3000Hz  4000Hz  6000Hz   8000Hz   Right ear:           Left ear:           Comments: Patient is able to hear conversational tones without difficulty. No issues reported.  Vision Screening Comments: Followed by Dr.  Wears corrective lenses when reading  Cataract extraction, bilateral  Dietary issues and exercise activities discussed: Current Exercise Habits: The patient does not participate in regular exercise at present  Healthy diet Good water intake  Goals      Patient Stated   .  I want to lower my cholesterol (pt-stated)      Increase activity as tolerated Healthy diet      Depression Screen PHQ 2/9 Scores 11/06/2020 08/09/2020 10/22/2019 07/21/2019 12/08/2018 12/08/2018 10/26/2018  PHQ - 2 Score 0 0 0 0 1 1 5   PHQ- 9 Score - - - - 3 - 14  Exception Documentation - - - - - - Patient refusal    Fall Risk Fall Risk  11/06/2020 07/24/2020 10/22/2019 10/22/2019 07/21/2019  Falls in the past year? 0 0 0 0 1  Number falls in past yr: 0 - - 0 0  Injury with Fall? 0 - - - 0  Follow up Falls evaluation completed Falls evaluation completed Falls prevention discussed Falls evaluation completed Falls evaluation completed    FALL RISK PREVENTION  PERTAINING TO THE HOME: Handrails in use when climbing stairs? yes Home free of loose throw rugs in walkways, pet beds, electrical cords, etc? Yes  Adequate lighting in your home to reduce risk of falls? Yes   ASSISTIVE DEVICES UTILIZED TO PREVENT FALLS: Life alert? No  Use of a cane, walker or w/c? No   TIMED UP AND GO: Was the test performed? No . Virtual visit.   Cognitive Function: Followed by Neurology.  MMSE - Mini Mental State Exam 10/19/2018 10/16/2016  Orientation to time 5 5  Orientation to Place 5 5  Registration 3 3  Attention/ Calculation 4 5  Recall 3 3  Language- name 2 objects 2 2  Language- repeat 1 1  Language- follow 3 step command 3 3  Language- read & follow direction 1 1  Write a sentence 1 1  Copy design 1 1  Total score 29 30     6CIT Screen 10/16/2017  What Year? 0 points  What month? 0 points  What time? 0 points  Count back from 20 0 points  Months in reverse 0 points  Repeat phrase 0 points  Total Score 0    Immunizations Immunization History  Administered Date(s) Administered  . Fluad Quad(high Dose 65+) 07/21/2019, 08/09/2020  . Influenza Split 10/01/2012  . Influenza, High Dose Seasonal PF 08/21/2016, 09/02/2017, 10/19/2018  . Influenza,inj,Quad PF,6+ Mos 07/27/2013, 11/02/2014, 11/09/2015  . Pneumococcal Conjugate-13 11/02/2014  . Pneumococcal Polysaccharide-23 10/01/2012    TDAP status: Due, Education has been provided regarding the importance of this vaccine. Advised may receive this vaccine at local pharmacy or Health Dept. Aware to provide a copy of the vaccination record if obtained from local pharmacy or Health Dept. Verbalized acceptance and understanding. Deferred.    Health Maintenance Health Maintenance  Topic Date Due  . Hepatitis C Screening  Never done  . COVID-19 Vaccine (1) 11/22/2020 (Originally 07/01/1953)  . TETANUS/TDAP  11/06/2021 (Originally 06/30/1960)  . INFLUENZA VACCINE  Completed  . DEXA SCAN  Completed   . PNA vac Low Risk Adult  Completed   Colorectal cancer screening: No longer required.  Mammogram- 04/20/18. DIGITAL SCREENING BILATERAL MAMMOGRAM WITH TOMO AND CAD  Bone density- 07/20/19.  Lung Cancer Screening: (Low Dose CT Chest recommended if Age 61-80 years, 30 pack-year currently smoking OR have quit w/in 15years.) does not qualify.   Hepatitis C Screening: does not qualify.   Vision Screening: Recommended annual ophthalmology exams for early detection of glaucoma and other disorders of the eye. Is the patient up to date with their annual eye exam?  Yes  Who is the provider or what is the name of the office in which the patient attends annual eye exams? Dr. Doren Custard.   Dental Screening: Recommended annual dental exams for proper oral hygiene.  Community Resource Referral / Chronic Care Management: CRR required this visit?  No   CCM required this visit?  No      Plan:   Keep all routine maintenance appointments.   Follow up 11/29/20 @ 1:45  I have personally reviewed and noted the following in the patient's chart:   . Medical and social history . Use of alcohol, tobacco or illicit drugs  . Current medications and supplements . Functional ability and status . Nutritional status . Physical activity . Advanced directives . List of other physicians . Hospitalizations, surgeries, and ER visits in previous 12 months . Vitals . Screenings to include cognitive, depression, and falls . Referrals and appointments  In addition, I have reviewed and discussed with patient certain preventive protocols, quality metrics, and best practice recommendations. A written personalized care plan for preventive services as well as general preventive health recommendations were provided to patient via mychart.     Varney Biles, LPN   [id number]

## 2020-11-08 ENCOUNTER — Ambulatory Visit: Payer: Medicare HMO | Admitting: Family Medicine

## 2020-11-16 ENCOUNTER — Telehealth: Payer: Self-pay | Admitting: Family Medicine

## 2020-11-16 NOTE — Telephone Encounter (Signed)
Access Nurse  Pt called her BP is running around 85/50 86/43 84/53  94/66 Pt said that she just feels weird but can't explain it

## 2020-11-16 NOTE — Telephone Encounter (Signed)
Agree with rec [facility] urgent care or ER low BP 80s sbp Increase hydration with water  If refuses ER this is pt decision but its rec  Sch appt with PCP in meantime please

## 2020-11-16 NOTE — Telephone Encounter (Signed)
Unable to access the nurse folder. Please advise.

## 2020-11-16 NOTE — Telephone Encounter (Signed)
Patient did go to UC there was a 4 hour wait so she left I have scheduled a virtual with PCP for 11/17/20 for 10:30, patient did get Pedialyte to drink and she took some amlodipine 2.5 mg she had on hand her BP is 143/68 pulse 70 now per patient. Reports feeling better her sister will stay and monitor her if she worsens advised calling 911 patient stated she would.

## 2020-11-16 NOTE — Telephone Encounter (Signed)
Patient said access nurse advised her to go to ER, just retook BP 130/104 pulse 80, oxygen 98% tested positive around December 20th. Patient received Remdesivir infusion. Denies any pain or shortness of breath, has some diarrhea like and upset stomach. Still fatigued no energy. Took some Sleep-Aid last night but took only half dose liquid form and this made her feel very groggy, she refuses ER right now says that her sister is coming over to sit with her and bringing a different BP cuff if her BP continues to rise or changes she will go to ER or UC. Advised again she should be evaluated at urgent care or the ER, patient refuses at this time.

## 2020-11-16 NOTE — Telephone Encounter (Signed)
Noted. Agree with need for evaluation. Please follow up with the patient to make sure was evaluated. Thanks.

## 2020-11-16 NOTE — Telephone Encounter (Signed)
Patient sister cam over with manual cuff and BP with this cuff is 167/87 pulse 75 she stopped amlodipine 2.5 mg in August, due to having low pressure, and her BP for a long time has stayed around 110/70 but now her BP has gone back up and she just did not feel right this morning. Patient says she still having diarrhea and her head feels heavy. I advised she really needs to go to UC I would not advise restarting anything until she is evaluated. Patient advised she would have sister drive her to ER.

## 2020-11-16 NOTE — Telephone Encounter (Signed)
Noted f/u PCP 11/17/20

## 2020-11-17 ENCOUNTER — Encounter: Payer: Self-pay | Admitting: Family Medicine

## 2020-11-17 ENCOUNTER — Other Ambulatory Visit: Payer: Self-pay

## 2020-11-17 ENCOUNTER — Telehealth (INDEPENDENT_AMBULATORY_CARE_PROVIDER_SITE_OTHER): Payer: Medicare HMO | Admitting: Family Medicine

## 2020-11-17 DIAGNOSIS — I10 Essential (primary) hypertension: Secondary | ICD-10-CM

## 2020-11-17 MED ORDER — AMLODIPINE BESYLATE 2.5 MG PO TABS: 2.5000 mg | ORAL_TABLET | Freq: Every day | ORAL | 3 refills | Status: DC

## 2020-11-17 NOTE — Assessment & Plan Note (Signed)
Discussed continue with amlodipine 2.5 mg once daily.  She can continue Lasix 20 mg daily as needed for swelling.  She will monitor her blood pressure 2 hours after taking her medication.  We will see her back in a couple of weeks as scheduled to see how her blood pressure is doing.

## 2020-11-17 NOTE — Progress Notes (Signed)
Virtual Visit via telephone Note  This visit type was conducted due to national recommendations for restrictions regarding the COVID-19 pandemic (e.g. social distancing).  This format is felt to be most appropriate for this patient at this time.  All issues noted in this document were discussed and addressed.  No physical exam was performed (except for noted visual exam findings with Video Visits).   I connected with Cathy Coffey today at 10:30 AM EST by a video enabled telemedicine application or telephone and verified that I am speaking with the correct person using two identifiers. Location patient: home Location provider: work Persons participating in the virtual visit: patient, provider  I discussed the limitations, risks, security and privacy concerns of performing an evaluation and management service by telephone and the availability of in person appointments. I also discussed with the patient that there may be a patient responsible charge related to this service. The patient expressed understanding and agreed to proceed.  Interactive audio and video telecommunications were attempted between this provider and patient, however failed, due to patient having technical difficulties OR patient did not have access to video capability.  We continued and completed visit with audio only.   Reason for visit: f/u  HPI: Hypertension: Patient notes she had come off of amlodipine and Lasix back in August as her blood pressure was running slightly low.  She notes yesterday she started to feel poorly and ended up checking her blood pressure and it was less than 90/60 on her cuff though when her sister brought her blood pressure cuff over her blood pressure was up into the 160s over 90s.  The patient felt as though her blood pressure cuff was inaccurate.  She subsequently started back on her amlodipine 2.5 mg once daily yesterday evening and notes her blood pressure has trended down since then.  It was  116/70 earlier today and has been 142/75 most recently.  No chest pain or shortness of breath.  She feels like she is doing quite a bit better today.  She did take Lasix the last 2 days as she felt as though she had some ankle swelling and notes that was beneficial though she was urinating every 20 to 30 minutes.  She was drinking fluids frequently as well.   ROS: See pertinent positives and negatives per HPI.  Past Medical History:  Diagnosis Date  . Asthma   . COVID-19 virus infection 10/2020  . Diverticulosis   . Hypertension   . Hypothyroidism   . IBS (irritable bowel syndrome)   . Lichen planus   . Morbid obesity ([place])   . Pancreatitis due to common bile duct stone 2000    Past Surgical History:  Procedure Laterality Date  . CHOLECYSTECTOMY    . COLONOSCOPY  2011  . HERNIA REPAIR    . MOUTH SURGERY  09/2016  . TUBAL LIGATION      Family History  Problem Relation Age of Onset  . Hypothyroidism Mother   . Hypertension Mother   . Dementia Father   . Dementia Paternal Grandmother     SOCIAL HX: nonsmoker   Current Outpatient Medications:  .  amLODipine (NORVASC) 2.5 MG tablet, Take 1 tablet (2.5 mg total) by mouth daily., Disp: 90 tablet, Rfl: 3 .  B Complex-C (SUPER B COMPLEX PO), Take 1 capsule by mouth., Disp: , Rfl:  .  Calcium Carbonate-Vitamin D (CALCIUM-VITAMIN D) 500-200 MG-UNIT per tablet, Take 3 tablets by mouth daily. , Disp: , Rfl:  .  desonide (DESOWEN) 0.05 % cream, Apply topically 2 (two) times daily., Disp: 30 g, Rfl: 0 .  DIGESTIVE AIDS MIXTURE PO, Take by mouth., Disp: , Rfl:  .  fluocinonide gel (LIDEX) 0.05 %, , Disp: , Rfl:  .  furosemide (LASIX) 20 MG tablet, TAKE 1 TABLET DAILY, Disp: 90 tablet, Rfl: 3 .  Glucosamine HCl 1000 MG TABS, Take 1 tablet by mouth., Disp: , Rfl:  .  levothyroxine (SYNTHROID) 100 MCG tablet, Take 1 tablet (100 mcg total) by mouth daily., Disp: 90 tablet, Rfl: 3 .  Magnesium 250 MG TABS, Take 500 mg by mouth. , Disp: ,  Rfl:  .  Multiple Vitamins-Minerals (PRESERVISION AREDS PO), Take 1 tablet by mouth daily. , Disp: , Rfl:  .  Omega-3 Fatty Acids (FISH OIL) 1000 MG CPDR, Take 3,000 mg by mouth daily. , Disp: , Rfl:  .  pantoprazole (PROTONIX) 40 MG tablet, TAKE 1 TABLET TWICE DAILY, Disp: 180 tablet, Rfl: 1 .  POTASSIUM PO, Take 600 mg by mouth., Disp: , Rfl:  .  Probiotic Product (TRUBIOTICS PO), Take 1 capsule by mouth., Disp: , Rfl:  .  rosuvastatin (CRESTOR) 10 MG tablet, Take 1 tablet (10 mg total) by mouth daily., Disp: 90 tablet, Rfl: 3  EXAM: This was a telephone visit and thus no physical exam was completed.  ASSESSMENT AND PLAN:  Discussed the following assessment and plan:  Problem List Items Addressed This Visit    Hypertension (Chronic)    Discussed continue with amlodipine 2.5 mg once daily.  She can continue Lasix 20 mg daily as needed for swelling.  She will monitor her blood pressure 2 hours after taking her medication.  We will see her back in a couple of weeks as scheduled to see how her blood pressure is doing.      Relevant Medications   amLODipine (NORVASC) 2.5 MG tablet       I discussed the assessment and treatment plan with the patient. The patient was provided an opportunity to ask questions and all were answered. The patient agreed with the plan and demonstrated an understanding of the instructions.   The patient was advised to call back or seek an in-person evaluation if the symptoms worsen or if the condition fails to improve as anticipated.  I provided 8 minutes of non-face-to-face time during this encounter.   [name], MD

## 2020-11-21 ENCOUNTER — Telehealth: Payer: Self-pay | Admitting: Family Medicine

## 2020-11-21 NOTE — Telephone Encounter (Signed)
Pt called she needs a short supply of levothyroxine (SYNTHROID) 100 MCG tablet sent [facility] comes

## 2020-11-22 ENCOUNTER — Other Ambulatory Visit: Payer: Self-pay

## 2020-11-22 MED ORDER — LEVOTHYROXINE SODIUM 100 MCG PO TABS: 100.0000 ug | ORAL_TABLET | Freq: Every day | ORAL | 0 refills | Status: DC

## 2020-11-22 NOTE — Telephone Encounter (Signed)
It is fine to refill to cover until mail order Rx gets there.

## 2020-11-22 NOTE — Telephone Encounter (Signed)
Pt has 2 refills of Synthroid remaining coming from [place]. Ok to refill a minimum supply until 90 day prescription arrives by mail?

## 2020-11-22 NOTE — Telephone Encounter (Signed)
Called and spoke to Cathy Coffey. Cathy Coffey states that she has received word from [place] home delivery for her medication and that they are processing her refill. She states that she should receive her medication in about a week and asks for 10 pills as she is completely out and has none to take for tomorrow. 10 tablets of levothyroxine has [facility] to [facility] on [address].

## 2020-11-29 ENCOUNTER — Ambulatory Visit: Payer: Medicare HMO | Admitting: Family Medicine

## 2020-12-05 DIAGNOSIS — H6983 Other specified disorders of Eustachian tube, bilateral: Secondary | ICD-10-CM | POA: Diagnosis not present

## 2020-12-05 DIAGNOSIS — J320 Chronic maxillary sinusitis: Secondary | ICD-10-CM | POA: Diagnosis not present

## 2020-12-11 ENCOUNTER — Other Ambulatory Visit: Payer: Self-pay

## 2020-12-11 DIAGNOSIS — E785 Hyperlipidemia, unspecified: Secondary | ICD-10-CM

## 2020-12-11 MED ORDER — ROSUVASTATIN CALCIUM 10 MG PO TABS: 10.0000 mg | ORAL_TABLET | Freq: Every day | ORAL | 3 refills | Status: DC

## 2020-12-15 ENCOUNTER — Ambulatory Visit: Payer: Medicare HMO | Admitting: Family Medicine

## 2021-01-03 DIAGNOSIS — M47816 Spondylosis without myelopathy or radiculopathy, lumbar region: Secondary | ICD-10-CM | POA: Diagnosis not present

## 2021-01-03 DIAGNOSIS — M4316 Spondylolisthesis, lumbar region: Secondary | ICD-10-CM | POA: Diagnosis not present

## 2021-01-03 DIAGNOSIS — M5136 Other intervertebral disc degeneration, lumbar region: Secondary | ICD-10-CM | POA: Diagnosis not present

## 2021-01-10 DIAGNOSIS — R69 Illness, unspecified: Secondary | ICD-10-CM | POA: Diagnosis not present

## 2021-01-10 DIAGNOSIS — G4719 Other hypersomnia: Secondary | ICD-10-CM | POA: Diagnosis not present

## 2021-01-10 DIAGNOSIS — G3184 Mild cognitive impairment, so stated: Secondary | ICD-10-CM | POA: Diagnosis not present

## 2021-01-17 DIAGNOSIS — M47816 Spondylosis without myelopathy or radiculopathy, lumbar region: Secondary | ICD-10-CM | POA: Diagnosis not present

## 2021-01-29 ENCOUNTER — Other Ambulatory Visit: Payer: Self-pay

## 2021-01-31 ENCOUNTER — Encounter: Payer: Self-pay | Admitting: Family Medicine

## 2021-01-31 ENCOUNTER — Other Ambulatory Visit: Payer: Self-pay

## 2021-01-31 ENCOUNTER — Ambulatory Visit (INDEPENDENT_AMBULATORY_CARE_PROVIDER_SITE_OTHER): Payer: Medicare HMO | Admitting: Family Medicine

## 2021-01-31 VITALS — BP 115/80 | HR 60 | Temp 98.0°F | Ht 61.5 in | Wt 171.0 lb

## 2021-01-31 DIAGNOSIS — I10 Essential (primary) hypertension: Secondary | ICD-10-CM

## 2021-01-31 DIAGNOSIS — G4733 Obstructive sleep apnea (adult) (pediatric): Secondary | ICD-10-CM | POA: Diagnosis not present

## 2021-01-31 DIAGNOSIS — R7309 Other abnormal glucose: Secondary | ICD-10-CM | POA: Diagnosis not present

## 2021-01-31 DIAGNOSIS — R1013 Epigastric pain: Secondary | ICD-10-CM | POA: Diagnosis not present

## 2021-01-31 DIAGNOSIS — E039 Hypothyroidism, unspecified: Secondary | ICD-10-CM

## 2021-01-31 DIAGNOSIS — E785 Hyperlipidemia, unspecified: Secondary | ICD-10-CM | POA: Insufficient documentation

## 2021-01-31 DIAGNOSIS — K219 Gastro-esophageal reflux disease without esophagitis: Secondary | ICD-10-CM

## 2021-01-31 DIAGNOSIS — R7303 Prediabetes: Secondary | ICD-10-CM | POA: Insufficient documentation

## 2021-01-31 LAB — HEMOGLOBIN A1C: Hgb A1c MFr Bld: 5.7 % (ref 4.6–6.5)

## 2021-01-31 LAB — TSH: TSH: 2.1 u[IU]/mL (ref 0.35–4.50)

## 2021-01-31 MED ORDER — PANTOPRAZOLE SODIUM 40 MG PO TBEC: 40.0000 mg | DELAYED_RELEASE_TABLET | Freq: Every day | ORAL | 3 refills | Status: DC

## 2021-01-31 NOTE — Progress Notes (Signed)
[name], MD Phone: [phone]  Cathy Coffey is a 80 y.o. female who presents today for f/u.  HYPERTENSION  Disease Monitoring  Home BP Monitoring similar to today Chest pain- no    Dyspnea- no Medications  Compliance-  Taking amlodipine, lasix.  Edema- no  HYPERLIPIDEMIA Symptoms Chest pain on exertion:  no   Medications: Compliance- taking crestor Right upper quadrant pain- no  Muscle aches- no  GERD:   Reflux symptoms: no   Abd pain: no   Blood in stool: no  Dysphagia: no   EGD: gastritis, no esophagitis  Medication: taking protonix  HYPOTHYROIDISM Disease Monitoring Weight changes: no  Skin Changes: no Heat/Cold intolerance: no  Medication Monitoring Compliance:  Taking synthroid   Last TSH:   Lab Results  Component Value Date   TSH 0.76 08/02/2020   Elevated glucose: This is on her most recent lab work.  She notes she was fasting.  She wonders about an A1c.  OSA: Patient was diagnosed with mild OSA.  She saw ENT to discuss having the implant placed though she notes she was advised that given that it was mild OSA and that she had lost some weight that they may not approve this.  They advised that she get up and be more active.  She has been walking more for exercise and notes that has helped with her sleep.  She is also on modafinil 25 mg daily which has been beneficial as well.  That is prescribed by neurology.     Social History   Tobacco Use  Smoking Status Never Smoker  Smokeless Tobacco Never Used    Current Outpatient Medications on File Prior to Visit  Medication Sig Dispense Refill  . amLODipine (NORVASC) 2.5 MG tablet Take 1 tablet (2.5 mg total) by mouth daily. 90 tablet 3  . B Complex-C (SUPER B COMPLEX PO) Take 1 capsule by mouth.    . Calcium Carbonate-Vitamin D (CALCIUM-VITAMIN D) 500-200 MG-UNIT per tablet Take 3 tablets by mouth daily.     Marland Kitchen desonide (DESOWEN) 0.05 % cream Apply topically 2 (two) times daily. 30 g 0  .  DIGESTIVE AIDS MIXTURE PO Take by mouth.    . fluocinonide gel (LIDEX) 0.05 %     . furosemide (LASIX) 20 MG tablet TAKE 1 TABLET DAILY 90 tablet 3  . Glucosamine HCl 1000 MG TABS Take 1 tablet by mouth.    . levothyroxine (SYNTHROID) 100 MCG tablet Take 1 tablet (100 mcg total) by mouth daily. 10 tablet 0  . Magnesium 250 MG TABS Take 500 mg by mouth.     . modafinil (PROVIGIL) 100 MG tablet Take 100 mg by mouth every morning.    . Multiple Vitamins-Minerals (PRESERVISION AREDS PO) Take 1 tablet by mouth daily.     . Omega-3 Fatty Acids (FISH OIL) 1000 MG CPDR Take 3,000 mg by mouth daily.     Marland Kitchen POTASSIUM PO Take 600 mg by mouth.    . Probiotic Product (TRUBIOTICS PO) Take 1 capsule by mouth.    . rosuvastatin (CRESTOR) 10 MG tablet Take 1 tablet (10 mg total) by mouth daily. 90 tablet 3  . traMADol (ULTRAM) 50 MG tablet Take 50 mg by mouth 2 (two) times daily as needed.     No current facility-administered medications on file prior to visit.     ROS see history of present illness  Objective  Physical Exam Vitals:   01/31/21 0932  BP: 115/80  Pulse: 60  Temp:  98 F (36.7 C)  SpO2: 99%    BP Readings from Last 3 Encounters:  01/31/21 115/80  11/17/20 (!) 142/75  10/30/20 (!) 147/82   Wt Readings from Last 3 Encounters:  01/31/21 171 lb (77.6 kg)  11/17/20 165 lb (74.8 kg)  11/06/20 164 lb (74.4 kg)    Physical Exam Constitutional:      General: She is not in acute distress.    Appearance: She is not diaphoretic.  Cardiovascular:     Rate and Rhythm: Normal rate and regular rhythm.     Heart sounds: Normal heart sounds.  Pulmonary:     Effort: Pulmonary effort is normal.     Breath sounds: Normal breath sounds.  Musculoskeletal:     Right lower leg: No edema.     Left lower leg: No edema.  Skin:    General: Skin is warm and dry.  Neurological:     Mental Status: She is alert.      Assessment/Plan: Please see individual problem list.  Problem List Items  Addressed This Visit    Elevated glucose - Primary    Check A1c.      Relevant Orders   HgB A1c   GERD (gastroesophageal reflux disease)    Well-controlled on Protonix.  Refill provided to take 1 tablet once daily.      Relevant Medications   pantoprazole (PROTONIX) 40 MG tablet   Hyperlipidemia    Continue Crestor 10 mg once daily.      Hypertension (Chronic)    Adequate control for age.  Continue amlodipine 2.5 mg once daily and Lasix 20 mg daily.      Hypothyroidism    Check TSH.  Continue Synthroid 100 mcg once daily.      Relevant Orders   TSH   OSA (obstructive sleep apnea)    She has seen several specialist for this.  She will continue with diet and exercise to lose weight and remain active.       Other Visit Diagnoses    Abdominal pain, epigastric       Relevant Medications   pantoprazole (PROTONIX) 40 MG tablet     This visit occurred during the SARS-CoV-2 public health emergency.  Safety protocols were in place, including screening questions prior to the visit, additional usage of staff PPE, and extensive cleaning of exam room while observing appropriate contact time as indicated for disinfecting solutions.    [name], MD [place]

## 2021-01-31 NOTE — Assessment & Plan Note (Signed)
Continue Crestor 10 mg once daily.

## 2021-01-31 NOTE — Assessment & Plan Note (Signed)
She has seen several specialist for this.  She will continue with diet and exercise to lose weight and remain active.

## 2021-01-31 NOTE — Patient Instructions (Signed)
Nice to see you. We will get labs today.

## 2021-01-31 NOTE — Assessment & Plan Note (Signed)
Check A1c. 

## 2021-01-31 NOTE — Assessment & Plan Note (Signed)
Check TSH.  Continue Synthroid 100 mcg once daily.

## 2021-01-31 NOTE — Assessment & Plan Note (Signed)
Adequate control for age.  Continue amlodipine 2.5 mg once daily and Lasix 20 mg daily.

## 2021-01-31 NOTE — Assessment & Plan Note (Signed)
Well-controlled on Protonix.  Refill provided to take 1 tablet once daily.

## 2021-02-19 ENCOUNTER — Ambulatory Visit (INDEPENDENT_AMBULATORY_CARE_PROVIDER_SITE_OTHER): Payer: Medicare HMO

## 2021-02-19 ENCOUNTER — Encounter: Payer: Self-pay | Admitting: Family Medicine

## 2021-02-19 ENCOUNTER — Other Ambulatory Visit: Payer: Self-pay

## 2021-02-19 ENCOUNTER — Ambulatory Visit (INDEPENDENT_AMBULATORY_CARE_PROVIDER_SITE_OTHER): Payer: Medicare HMO | Admitting: Family Medicine

## 2021-02-19 DIAGNOSIS — R1011 Right upper quadrant pain: Secondary | ICD-10-CM | POA: Diagnosis not present

## 2021-02-19 DIAGNOSIS — R1084 Generalized abdominal pain: Secondary | ICD-10-CM

## 2021-02-19 DIAGNOSIS — R109 Unspecified abdominal pain: Secondary | ICD-10-CM | POA: Diagnosis not present

## 2021-02-19 DIAGNOSIS — K5900 Constipation, unspecified: Secondary | ICD-10-CM | POA: Diagnosis not present

## 2021-02-19 HISTORY — DX: Generalized abdominal pain: R10.84

## 2021-02-19 LAB — COMPREHENSIVE METABOLIC PANEL
ALT: 14 U/L (ref 0–35)
AST: 18 U/L (ref 0–37)
Albumin: 4.2 g/dL (ref 3.5–5.2)
Alkaline Phosphatase: 55 U/L (ref 39–117)
BUN: 21 mg/dL (ref 6–23)
CO2: 31 mEq/L (ref 19–32)
Calcium: 9.4 mg/dL (ref 8.4–10.5)
Chloride: 104 mEq/L (ref 96–112)
Creatinine, Ser: 0.89 mg/dL (ref 0.40–1.20)
GFR: 62.44 mL/min (ref >60.00)
Glucose, Bld: 100 mg/dL — ABNORMAL HIGH (ref 70–99)
Potassium: 4.1 mEq/L (ref 3.5–5.1)
Sodium: 144 mEq/L (ref 135–145)
Total Bilirubin: 0.4 mg/dL (ref 0.2–1.2)
Total Protein: 6.4 g/dL (ref 6.0–8.3)

## 2021-02-19 LAB — CBC WITH DIFFERENTIAL/PLATELET
Basophils Absolute: 0 10*3/uL (ref 0.0–0.1)
Basophils Relative: 0.9 % (ref 0.0–3.0)
Eosinophils Absolute: 0.3 10*3/uL (ref 0.0–0.7)
Eosinophils Relative: 5.3 % — ABNORMAL HIGH (ref 0.0–5.0)
HCT: 38.5 % (ref 36.0–46.0)
Hemoglobin: 12.9 g/dL (ref 12.0–15.0)
Lymphocytes Relative: 21 % (ref 12.0–46.0)
Lymphs Abs: 1.1 10*3/uL (ref 0.7–4.0)
MCHC: 33.5 g/dL (ref 30.0–36.0)
MCV: 88.5 fl (ref 78.0–100.0)
Monocytes Absolute: 0.5 10*3/uL (ref 0.1–1.0)
Monocytes Relative: 9.9 % (ref 3.0–12.0)
Neutro Abs: 3.4 10*3/uL (ref 1.4–7.7)
Neutrophils Relative %: 62.9 % (ref 43.0–77.0)
Platelets: 165 10*3/uL (ref 150.0–400.0)
RBC: 4.35 Mil/uL (ref 3.87–5.11)
RDW: 14 % (ref 11.5–15.5)
WBC: 5.4 10*3/uL (ref 4.0–10.5)

## 2021-02-19 NOTE — Progress Notes (Signed)
[name], MD Phone: [phone]  Cathy Coffey is a 80 y.o. female who presents today for same day visit.   Abdominal pain/bloating: Patient notes this has been going on intermittently for a decade or more.  She notes occasionally she will get some bloating and have some abdominal pain in her right mid and upper abdomen.  Notes it feels as though the stool is trying to go through something smaller than usual.  She notes no sensation of constipation.  She does have bowel movements almost on a daily basis though does typically take a stool softener and a magnesium supplement.  No diarrhea.  No blood in her stool.  She does note vomiting helps relieve the discomfort when she has it.  She in the past noted she was advised by a physician that if she vomited it would relieve the discomfort and she has been doing that intermittently for years.  She is status post cholecystectomy.  She does still have her appendix and ovaries and uterus.  She has not tied this to any foods though does note leafy greens tend to make it worse.  She has cut down on the volume of her food in the past and did not make a difference.  She last had symptoms about a week ago.  She did have a CT scan in 2016 that did not reveal a cause for her symptoms.  Social History   Tobacco Use  Smoking Status Never Smoker  Smokeless Tobacco Never Used    Current Outpatient Medications on File Prior to Visit  Medication Sig Dispense Refill  . amLODipine (NORVASC) 2.5 MG tablet Take 1 tablet (2.5 mg total) by mouth daily. 90 tablet 3  . B Complex-C (SUPER B COMPLEX PO) Take 1 capsule by mouth.    . Calcium Carbonate-Vitamin D (CALCIUM-VITAMIN D) 500-200 MG-UNIT per tablet Take 3 tablets by mouth daily.     Marland Kitchen desonide (DESOWEN) 0.05 % cream Apply topically 2 (two) times daily. 30 g 0  . DIGESTIVE AIDS MIXTURE PO Take by mouth.    . fluocinonide gel (LIDEX) 0.05 %     . furosemide (LASIX) 20 MG tablet TAKE 1 TABLET DAILY 90 tablet  3  . Glucosamine HCl 1000 MG TABS Take 1 tablet by mouth.    . levothyroxine (SYNTHROID) 100 MCG tablet Take 1 tablet (100 mcg total) by mouth daily. 10 tablet 0  . Magnesium 250 MG TABS Take 500 mg by mouth.     . modafinil (PROVIGIL) 100 MG tablet Take 100 mg by mouth every morning.    . Multiple Vitamins-Minerals (PRESERVISION AREDS PO) Take 1 tablet by mouth daily.     . Omega-3 Fatty Acids (FISH OIL) 1000 MG CPDR Take 3,000 mg by mouth daily.     . pantoprazole (PROTONIX) 40 MG tablet Take 1 tablet (40 mg total) by mouth daily. 90 tablet 3  . POTASSIUM PO Take 600 mg by mouth.    . Probiotic Product (TRUBIOTICS PO) Take 1 capsule by mouth.    . rosuvastatin (CRESTOR) 10 MG tablet Take 1 tablet (10 mg total) by mouth daily. 90 tablet 3  . traMADol (ULTRAM) 50 MG tablet Take 50 mg by mouth 2 (two) times daily as needed.     No current facility-administered medications on file prior to visit.     ROS see history of present illness  Objective  Physical Exam Vitals:   02/19/21 0807  BP: 120/70  Pulse: 73  Temp: 98 F (  36.7 C)  SpO2: 99%    BP Readings from Last 3 Encounters:  02/19/21 120/70  01/31/21 115/80  11/17/20 (!) 142/75   Wt Readings from Last 3 Encounters:  02/19/21 172 lb 9.6 oz (78.3 kg)  01/31/21 171 lb (77.6 kg)  11/17/20 165 lb (74.8 kg)    Physical Exam Constitutional:      General: She is not in acute distress.    Appearance: She is not ill-appearing or diaphoretic.  Abdominal:     General: Bowel sounds are normal. There is no distension.     Palpations: Abdomen is soft. There is no mass.     Tenderness: There is abdominal tenderness (Slight tenderness in the right upper quadrant). There is no guarding or rebound.  Neurological:     Mental Status: She is alert.      Assessment/Plan: Please see individual problem list.  Problem List Items Addressed This Visit    Abdominal pain    The patient has a long history of abdominal pain in the right  and mid upper quadrants associated with bloating.  This may be related to constipation or a food related issue.  Given duration of symptoms I doubt this is anything nefarious we will proceed with initial work-up with abdominal plain film as well as a CMP and CBC.  If no obvious cause is noted on this initial work-up would consider more advanced imaging.      Relevant Orders   DG Abd 1 View   Comp Met (CMET)   CBC w/Diff     This visit occurred during the SARS-CoV-2 public health emergency.  Safety protocols were in place, including screening questions prior to the visit, additional usage of staff PPE, and extensive cleaning of exam room while observing appropriate contact time as indicated for disinfecting solutions.    [name], MD [place]

## 2021-02-19 NOTE — Assessment & Plan Note (Signed)
The patient has a long history of abdominal pain in the right and mid upper quadrants associated with bloating.  This may be related to constipation or a food related issue.  Given duration of symptoms I doubt this is anything nefarious we will proceed with initial work-up with abdominal plain film as well as a CMP and CBC.  If no obvious cause is noted on this initial work-up would consider more advanced imaging.

## 2021-02-19 NOTE — Patient Instructions (Signed)
Nice to see you. We will get an x-ray and labs today. We will contact you with the results.

## 2021-02-21 ENCOUNTER — Other Ambulatory Visit: Payer: Self-pay | Admitting: Family Medicine

## 2021-02-21 MED ORDER — LACTULOSE 10 GM/15ML PO SOLN: 10.0000 g | Freq: Every day | ORAL | 0 refills | Status: DC | PRN

## 2021-03-09 DIAGNOSIS — M5416 Radiculopathy, lumbar region: Secondary | ICD-10-CM | POA: Diagnosis not present

## 2021-03-09 DIAGNOSIS — M5136 Other intervertebral disc degeneration, lumbar region: Secondary | ICD-10-CM | POA: Diagnosis not present

## 2021-03-09 DIAGNOSIS — M47816 Spondylosis without myelopathy or radiculopathy, lumbar region: Secondary | ICD-10-CM | POA: Diagnosis not present

## 2021-03-09 DIAGNOSIS — M48062 Spinal stenosis, lumbar region with neurogenic claudication: Secondary | ICD-10-CM | POA: Diagnosis not present

## 2021-03-16 ENCOUNTER — Other Ambulatory Visit: Payer: Self-pay | Admitting: Family Medicine

## 2021-04-01 ENCOUNTER — Other Ambulatory Visit: Payer: Self-pay | Admitting: Family Medicine

## 2021-04-16 ENCOUNTER — Other Ambulatory Visit: Payer: Self-pay | Admitting: Family Medicine

## 2021-07-12 IMAGING — CR DG CHEST 2V
1 series · 2 of 2 positions shown · non-contrast
Comparison: 07/09/2020

CLINICAL DATA: New onset fever

EXAM:
CHEST - 2 VIEW

[Series 1: w chest pa · 0.14mm/px · 2 of 2 slices shown]
[im 1/2]
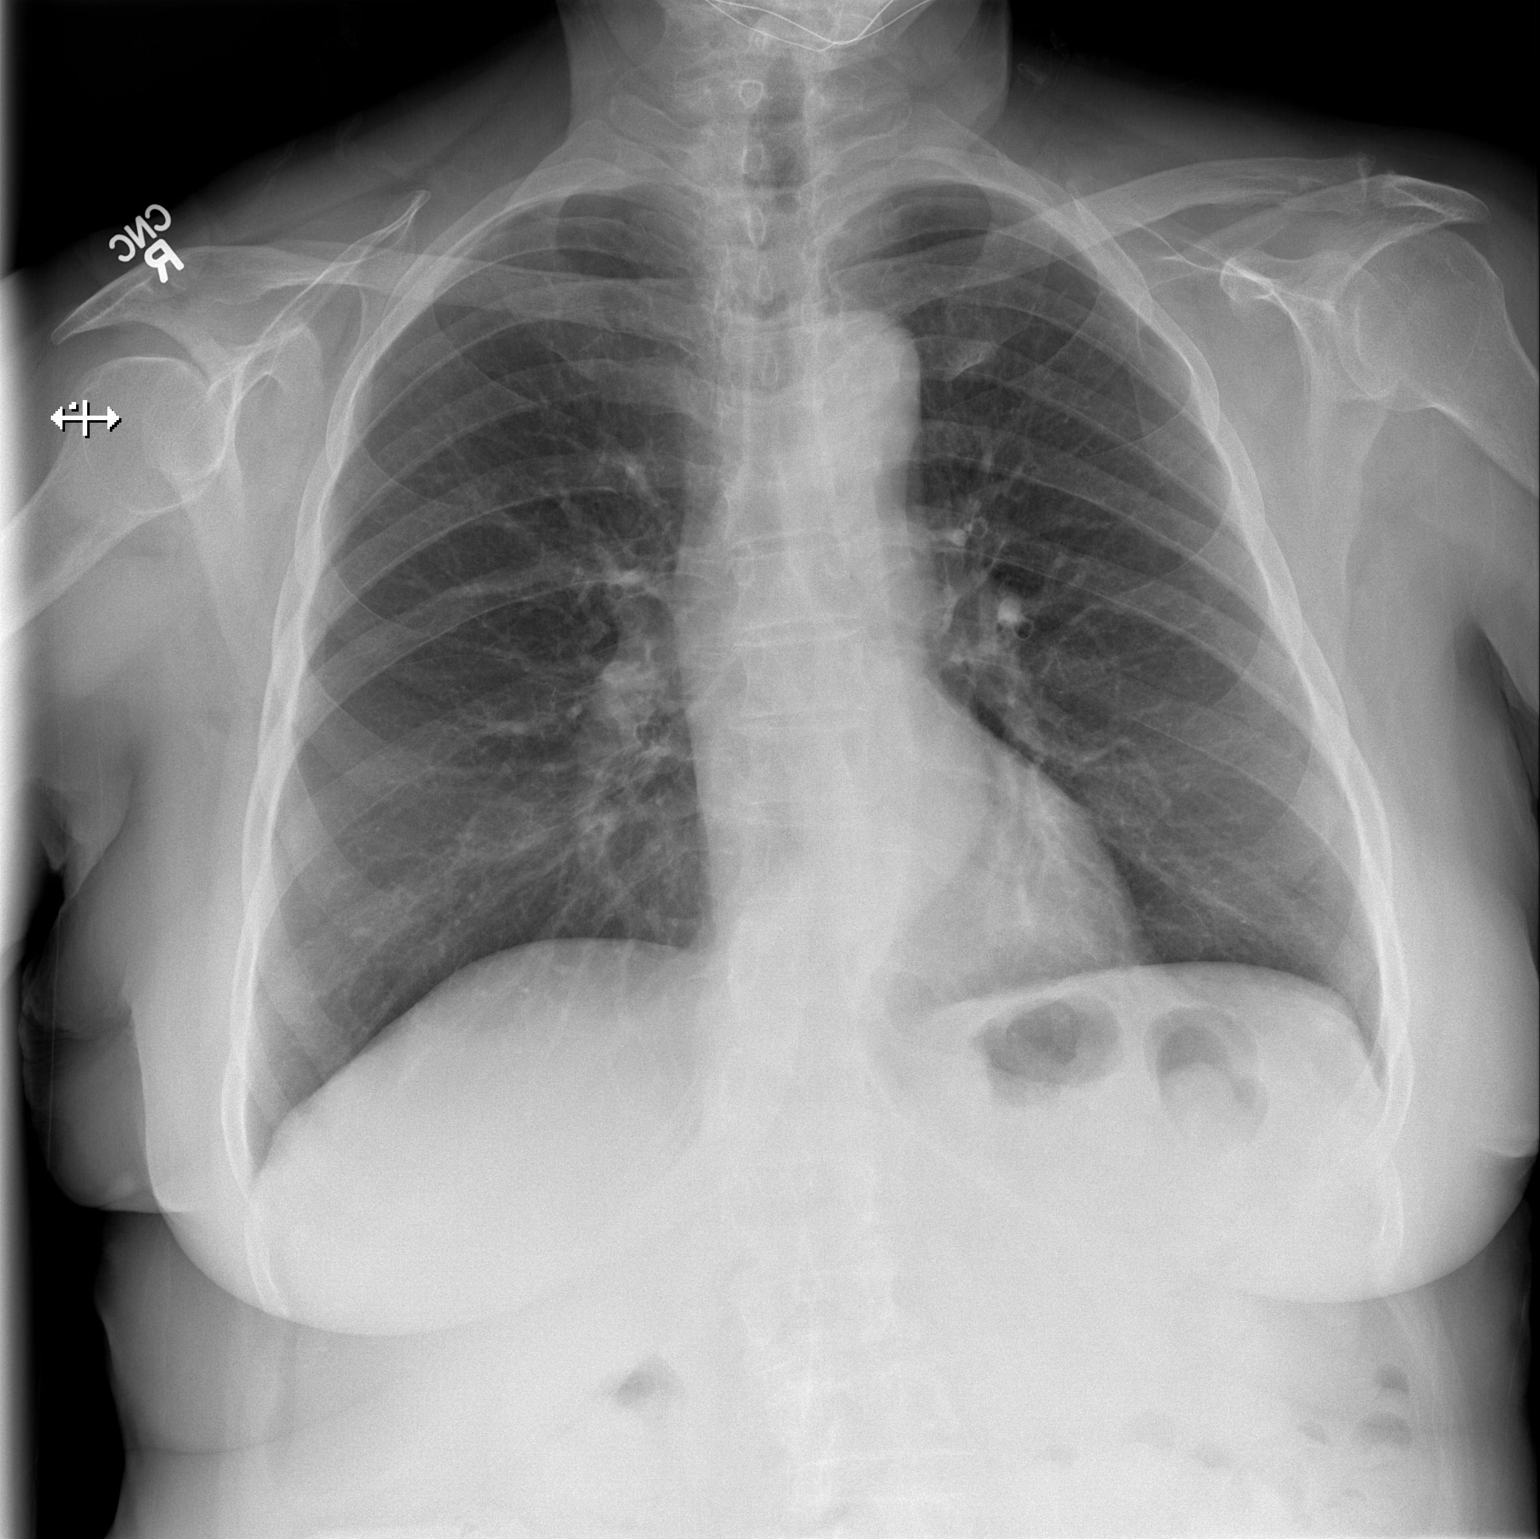
[im 2/2]
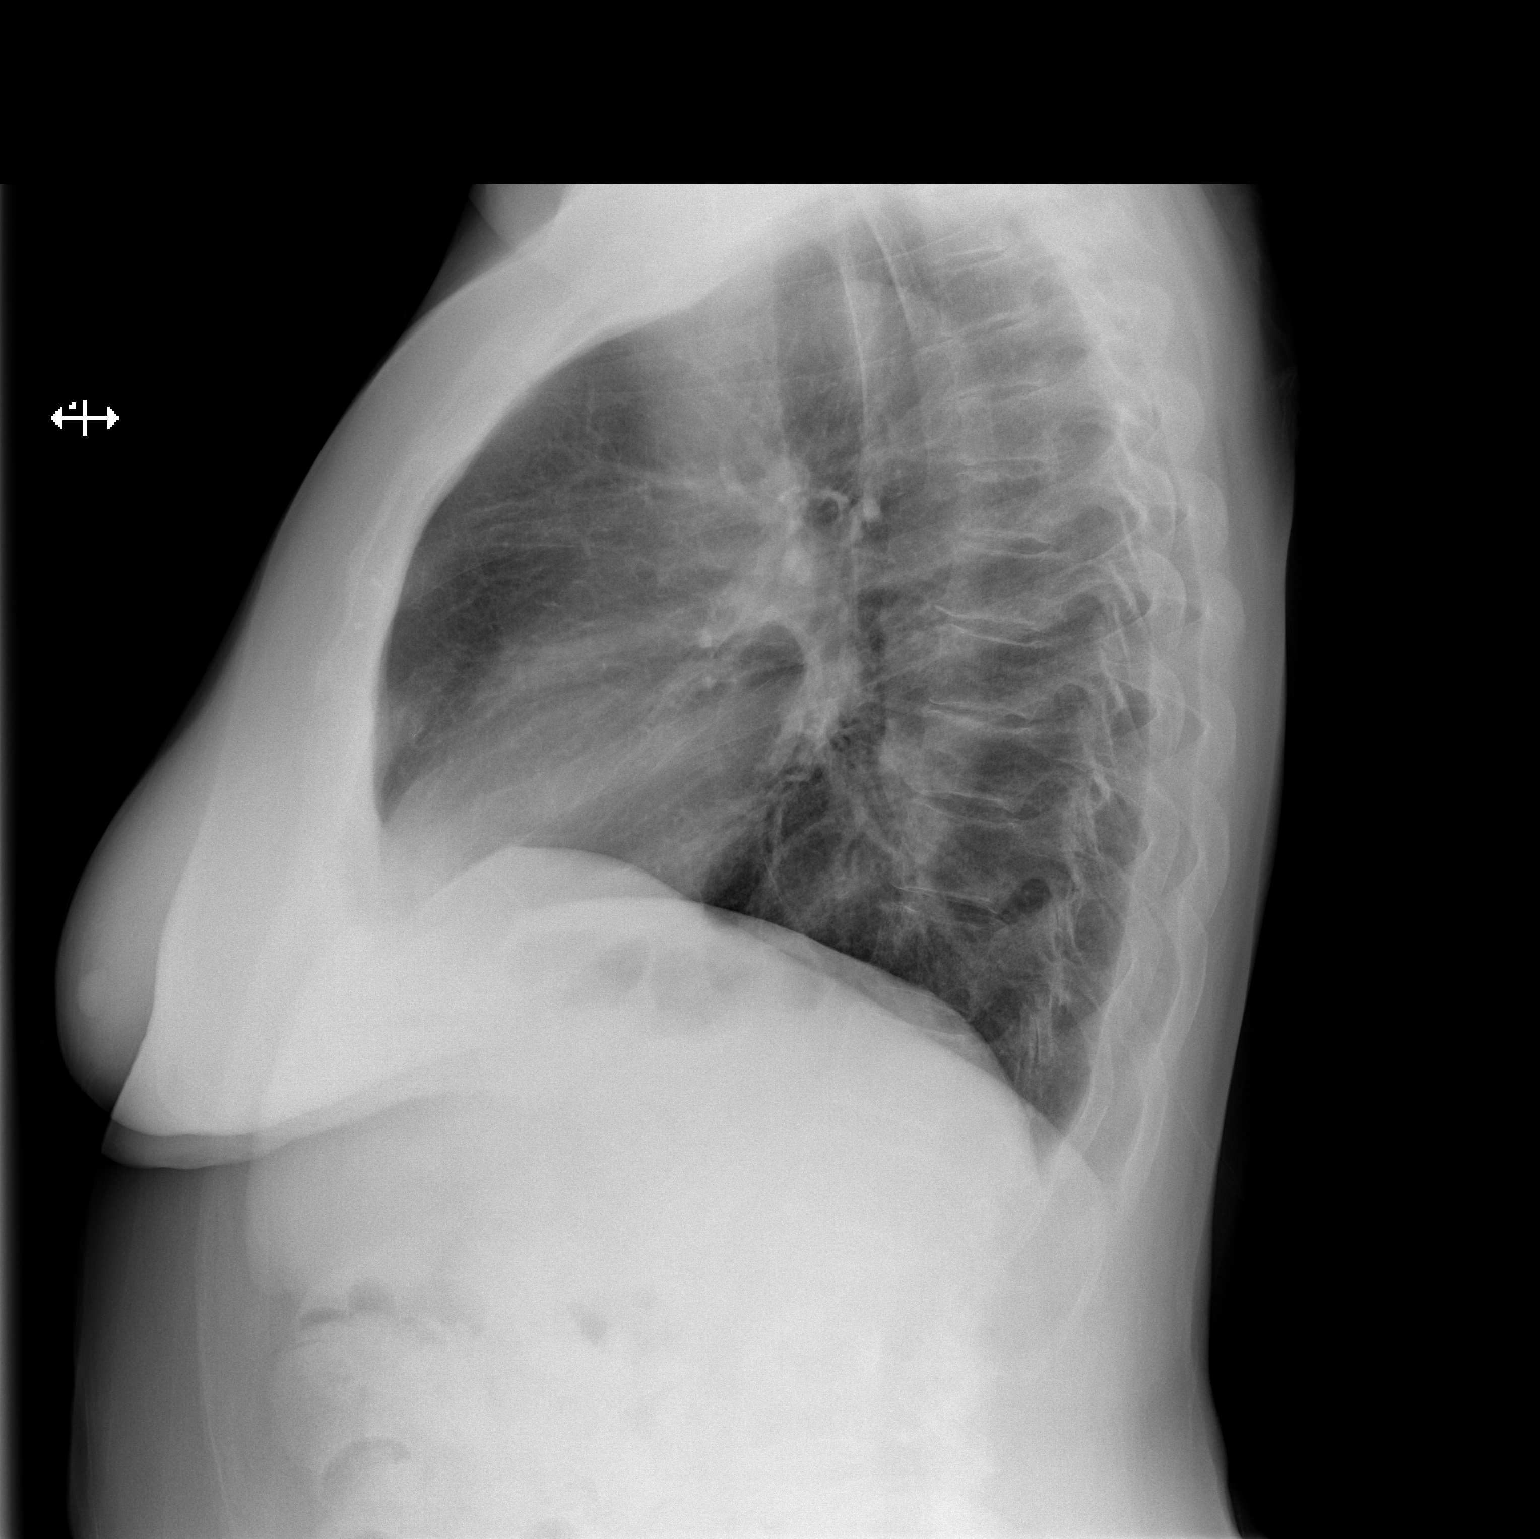

[2 of 2 positions shown; findings below may reference images not displayed]

FINDINGS: The heart size and mediastinal contours are within normal limits.
Both lungs are clear. The visualized skeletal structures are
unremarkable.
IMPRESSION: No active cardiopulmonary disease.

## 2021-08-03 ENCOUNTER — Ambulatory Visit (INDEPENDENT_AMBULATORY_CARE_PROVIDER_SITE_OTHER): Payer: Medicare HMO | Admitting: Family Medicine

## 2021-08-03 ENCOUNTER — Other Ambulatory Visit: Payer: Self-pay

## 2021-08-03 VITALS — BP 110/70 | HR 70 | Temp 98.4°F | Ht 61.0 in | Wt 163.4 lb

## 2021-08-03 DIAGNOSIS — Z5181 Encounter for therapeutic drug level monitoring: Secondary | ICD-10-CM

## 2021-08-03 DIAGNOSIS — E785 Hyperlipidemia, unspecified: Secondary | ICD-10-CM

## 2021-08-03 DIAGNOSIS — R7303 Prediabetes: Secondary | ICD-10-CM

## 2021-08-03 DIAGNOSIS — R1011 Right upper quadrant pain: Secondary | ICD-10-CM | POA: Diagnosis not present

## 2021-08-03 DIAGNOSIS — E039 Hypothyroidism, unspecified: Secondary | ICD-10-CM | POA: Diagnosis not present

## 2021-08-03 DIAGNOSIS — Z23 Encounter for immunization: Secondary | ICD-10-CM

## 2021-08-03 DIAGNOSIS — I10 Essential (primary) hypertension: Secondary | ICD-10-CM

## 2021-08-03 LAB — LIPID PANEL
Cholesterol: 187 mg/dL (ref 0–200)
HDL: 56.8 mg/dL (ref >39.00)
NonHDL: 130.35
Total CHOL/HDL Ratio: 3
Triglycerides: 221 mg/dL — ABNORMAL HIGH (ref 0.0–149.0)
VLDL: 44.2 mg/dL — ABNORMAL HIGH (ref 0.0–40.0)

## 2021-08-03 LAB — LDL CHOLESTEROL, DIRECT: Direct LDL: 96 mg/dL

## 2021-08-03 LAB — COMPREHENSIVE METABOLIC PANEL
ALT: 14 U/L (ref 0–35)
AST: 20 U/L (ref 0–37)
Albumin: 4.2 g/dL (ref 3.5–5.2)
Alkaline Phosphatase: 64 U/L (ref 39–117)
BUN: 21 mg/dL (ref 6–23)
CO2: 30 mEq/L (ref 19–32)
Calcium: 9.3 mg/dL (ref 8.4–10.5)
Chloride: 103 mEq/L (ref 96–112)
Creatinine, Ser: 0.93 mg/dL (ref 0.40–1.20)
GFR: 59.04 mL/min — ABNORMAL LOW (ref >60.00)
Glucose, Bld: 92 mg/dL (ref 70–99)
Potassium: 4.1 mEq/L (ref 3.5–5.1)
Sodium: 142 mEq/L (ref 135–145)
Total Bilirubin: 0.5 mg/dL (ref 0.2–1.2)
Total Protein: 6.5 g/dL (ref 6.0–8.3)

## 2021-08-03 LAB — MAGNESIUM: Magnesium: 2.4 mg/dL (ref 1.5–2.5)

## 2021-08-03 LAB — TSH: TSH: 2.12 u[IU]/mL (ref 0.35–5.50)

## 2021-08-03 LAB — HEMOGLOBIN A1C: Hgb A1c MFr Bld: 5.6 % (ref 4.6–6.5)

## 2021-08-03 NOTE — Progress Notes (Signed)
[name], MD Phone: [phone]  Cathy Coffey is a 80 y.o. female who presents today for follow-up.  Right upper quadrant pain/constipation: The patient notes this improved after she started having better bowel movements.  She took the lactulose and that was beneficial.  Notes no blood in her stool.  Notes no current abdominal pain.  She takes a stool softener and a magnesium tablet daily to help with this.  Hypertension: Notes that similar to today.  Taking amlodipine and Lasix.  No chest pain, shortness of breath, or edema.  Hyperlipidemia: No longer on a statin.  Reports she is concerned about memory issues with statins.  She is watching her diet.  She cut down on fatty food intake.  No history of stroke or heart attack.  Social History   Tobacco Use  Smoking Status Never  Smokeless Tobacco Never    Current Outpatient Medications on File Prior to Visit  Medication Sig Dispense Refill   amLODipine (NORVASC) 2.5 MG tablet Take 1 tablet (2.5 mg total) by mouth daily. 90 tablet 3   B Complex-C (SUPER B COMPLEX PO) Take 1 capsule by mouth.     Calcium Carbonate-Vitamin D (CALCIUM-VITAMIN D) 500-200 MG-UNIT per tablet Take 3 tablets by mouth daily.      desonide (DESOWEN) 0.05 % cream Apply topically 2 (two) times daily. 30 g 0   DIGESTIVE AIDS MIXTURE PO Take by mouth.     fluocinonide gel (LIDEX) 0.05 %      furosemide (LASIX) 20 MG tablet TAKE 1 TABLET DAILY 90 tablet 3   Glucosamine HCl 1000 MG TABS Take 1 tablet by mouth.     levothyroxine (SYNTHROID) 100 MCG tablet Take 1 tablet (100 mcg total) by mouth daily. 10 tablet 0   Magnesium 250 MG TABS Take 500 mg by mouth.      Multiple Vitamins-Minerals (PRESERVISION AREDS PO) Take 1 tablet by mouth daily.      Omega-3 Fatty Acids (FISH OIL) 1000 MG CPDR Take 3,000 mg by mouth daily.      pantoprazole (PROTONIX) 40 MG tablet Take 1 tablet (40 mg total) by mouth daily. 90 tablet 3   POTASSIUM PO Take 600 mg by mouth.      Probiotic Product (TRUBIOTICS PO) Take 1 capsule by mouth.     rosuvastatin (CRESTOR) 10 MG tablet Take 1 tablet (10 mg total) by mouth daily. 90 tablet 3   traMADol (ULTRAM) 50 MG tablet Take 50 mg by mouth 2 (two) times daily as needed.     lactulose (CHRONULAC) 10 GM/15ML solution TAKE 15 MLS (10 G TOTAL) BY MOUTH DAILY AS NEEDED FOR MILD CONSTIPATION. (Patient not taking: Reported on 08/03/2021) 15 mL 1   modafinil (PROVIGIL) 100 MG tablet Take 100 mg by mouth every morning. (Patient not taking: Reported on 08/03/2021)     No current facility-administered medications on file prior to visit.     ROS see history of present illness  Objective  Physical Exam Vitals:   08/03/21 0927  BP: 110/70  Pulse: 70  Temp: 98.4 F (36.9 C)  SpO2: 98%    BP Readings from Last 3 Encounters:  08/03/21 110/70  02/19/21 120/70  01/31/21 115/80   Wt Readings from Last 3 Encounters:  08/03/21 163 lb 6.4 oz (74.1 kg)  02/19/21 172 lb 9.6 oz (78.3 kg)  01/31/21 171 lb (77.6 kg)    Physical Exam Constitutional:      General: She is not in acute distress.  Appearance: She is not diaphoretic.  Cardiovascular:     Rate and Rhythm: Normal rate and regular rhythm.     Heart sounds: Normal heart sounds.  Pulmonary:     Effort: Pulmonary effort is normal.     Breath sounds: Normal breath sounds.  Abdominal:     General: Bowel sounds are normal. There is no distension.     Palpations: Abdomen is soft.     Tenderness: There is no abdominal tenderness.  Skin:    General: Skin is warm and dry.  Neurological:     Mental Status: She is alert.     Assessment/Plan: Please see individual problem list.  Problem List Items Addressed This Visit     Hypertension (Chronic)    Well-controlled.  She will continue amlodipine 2.5 mg once daily and Lasix 20 mg daily.  Check labs.      Abdominal pain    Resolved.  Likely related to constipation.  She will continue to monitor.  Given her daily use of  magnesium we will check a magnesium level.      Hyperlipidemia    Check lipid panel.  Encouraged healthy diet.      Relevant Orders   Comp Met (CMET)   Lipid panel   Hypothyroidism - Primary    Check TSH.      Relevant Orders   TSH   Prediabetes    Check A1c.      Relevant Orders   HgB A1c   Other Visit Diagnoses     Medication monitoring encounter       Relevant Orders   Magnesium   Need for immunization against influenza       Relevant Orders   Flu Vaccine QUAD High Dose(Fluad) (Completed)       Return in about 6 months (around 01/31/2022) for Hypertension.  This visit occurred during the SARS-CoV-2 public health emergency.  Safety protocols were in place, including screening questions prior to the visit, additional usage of staff PPE, and extensive cleaning of exam room while observing appropriate contact time as indicated for disinfecting solutions.    [name], MD [place]

## 2021-08-03 NOTE — Assessment & Plan Note (Signed)
Check A1c. 

## 2021-08-03 NOTE — Assessment & Plan Note (Signed)
Check TSH 

## 2021-08-03 NOTE — Patient Instructions (Signed)
Nice to see you. We are going to get some lab work today. Please let us know if your constipation becomes more of an issue in the future.

## 2021-08-03 NOTE — Assessment & Plan Note (Signed)
Check lipid panel.  Encouraged healthy diet.

## 2021-08-03 NOTE — Assessment & Plan Note (Signed)
Resolved.  Likely related to constipation.  She will continue to monitor.  Given her daily use of magnesium we will check a magnesium level.

## 2021-08-03 NOTE — Assessment & Plan Note (Signed)
Well-controlled.  She will continue amlodipine 2.5 mg once daily and Lasix 20 mg daily.  Check labs.

## 2021-08-05 ENCOUNTER — Other Ambulatory Visit: Payer: Self-pay | Admitting: Family Medicine

## 2021-08-20 ENCOUNTER — Other Ambulatory Visit: Payer: Self-pay | Admitting: Family Medicine

## 2021-08-20 DIAGNOSIS — R1013 Epigastric pain: Secondary | ICD-10-CM

## 2021-08-22 DIAGNOSIS — H35363 Drusen (degenerative) of macula, bilateral: Secondary | ICD-10-CM | POA: Diagnosis not present

## 2021-08-22 DIAGNOSIS — Z0100 Encounter for examination of eyes and vision without abnormal findings: Secondary | ICD-10-CM | POA: Diagnosis not present

## 2021-08-22 DIAGNOSIS — H5203 Hypermetropia, bilateral: Secondary | ICD-10-CM | POA: Diagnosis not present

## 2021-08-22 DIAGNOSIS — H524 Presbyopia: Secondary | ICD-10-CM | POA: Diagnosis not present

## 2021-08-22 DIAGNOSIS — H35373 Puckering of macula, bilateral: Secondary | ICD-10-CM | POA: Diagnosis not present

## 2021-08-22 DIAGNOSIS — H40023 Open angle with borderline findings, high risk, bilateral: Secondary | ICD-10-CM | POA: Diagnosis not present

## 2021-08-22 DIAGNOSIS — H52223 Regular astigmatism, bilateral: Secondary | ICD-10-CM | POA: Diagnosis not present

## 2021-10-09 ENCOUNTER — Emergency Department
Admission: EM | Admit: 2021-10-09 | Discharge: 2021-10-09 | Disposition: A | Discharge status: Home / Self Care | Payer: Medicare HMO | Attending: Emergency Medicine | Admitting: Emergency Medicine

## 2021-10-09 ENCOUNTER — Encounter: Payer: Self-pay | Admitting: Intensive Care

## 2021-10-09 ENCOUNTER — Other Ambulatory Visit: Payer: Self-pay

## 2021-10-09 ENCOUNTER — Telehealth: Payer: Self-pay | Admitting: Family Medicine

## 2021-10-09 DIAGNOSIS — Z79899 Other long term (current) drug therapy: Secondary | ICD-10-CM | POA: Insufficient documentation

## 2021-10-09 DIAGNOSIS — R55 Syncope and collapse: Secondary | ICD-10-CM | POA: Diagnosis not present

## 2021-10-09 DIAGNOSIS — J45909 Unspecified asthma, uncomplicated: Secondary | ICD-10-CM | POA: Insufficient documentation

## 2021-10-09 DIAGNOSIS — E039 Hypothyroidism, unspecified: Secondary | ICD-10-CM | POA: Diagnosis not present

## 2021-10-09 DIAGNOSIS — R197 Diarrhea, unspecified: Secondary | ICD-10-CM

## 2021-10-09 DIAGNOSIS — Z8616 Personal history of COVID-19: Secondary | ICD-10-CM | POA: Diagnosis not present

## 2021-10-09 DIAGNOSIS — E860 Dehydration: Secondary | ICD-10-CM | POA: Diagnosis not present

## 2021-10-09 DIAGNOSIS — I10 Essential (primary) hypertension: Secondary | ICD-10-CM | POA: Insufficient documentation

## 2021-10-09 LAB — URINALYSIS, ROUTINE W REFLEX MICROSCOPIC
Bilirubin Urine: NEGATIVE
Glucose, UA: NEGATIVE mg/dL
Hgb urine dipstick: NEGATIVE
Ketones, ur: NEGATIVE mg/dL
Leukocytes,Ua: NEGATIVE
Nitrite: NEGATIVE
Protein, ur: NEGATIVE mg/dL
Specific Gravity, Urine: 1.015 (ref 1.005–1.030)
pH: 7 (ref 5.0–8.0)

## 2021-10-09 LAB — CBC
HCT: 38.7 % (ref 36.0–46.0)
Hemoglobin: 12.8 g/dL (ref 12.0–15.0)
MCH: 29.8 pg (ref 26.0–34.0)
MCHC: 33.1 g/dL (ref 30.0–36.0)
MCV: 90.2 fL (ref 80.0–100.0)
Platelets: 175 K/uL (ref 150–400)
RBC: 4.29 MIL/uL (ref 3.87–5.11)
RDW: 13.4 % (ref 11.5–15.5)
WBC: 6.1 K/uL (ref 4.0–10.5)
nRBC: 0 % (ref 0.0–0.2)

## 2021-10-09 LAB — BASIC METABOLIC PANEL
Anion gap: 7 (ref 5–15)
BUN: 22 mg/dL (ref 8–23)
CO2: 26 mmol/L (ref 22–32)
Calcium: 9.2 mg/dL (ref 8.9–10.3)
Chloride: 106 mmol/L (ref 98–111)
Creatinine, Ser: 0.8 mg/dL (ref 0.44–1.00)
GFR, Estimated: >60 mL/min (ref >60)
Glucose, Bld: 109 mg/dL — ABNORMAL HIGH (ref 70–99)
Potassium: 4.1 mmol/L (ref 3.5–5.1)
Sodium: 139 mmol/L (ref 135–145)

## 2021-10-09 NOTE — Telephone Encounter (Signed)
Patient has not been feeling weak,woozy, has had diarrhea, says she feels like since she had low potassium before, was hospitalized 8/21 for low potassium says she does not feel right head cannot concentrate, patient says last time her heart stopped due to low potassium ask patient if she had someone that could drive sh stated her granddaughter was driving now taking her to [facility] walkin, advised this was right that she needs evaluation now and probably IV fluid with diarrhea for one week. Patient kept saying her head does not feel right advised she is right beside ED to have granddaughter go in for assistance and ED to assist now.

## 2021-10-09 NOTE — Telephone Encounter (Signed)
FYI. Agree with need for evaluation now.  Per review, pt in ER.

## 2021-10-09 NOTE — ED Provider Notes (Signed)
[facility] Emergency Department Provider Note ____________________________________________   Event Date/Time   First MD Initiated Contact with Patient 10/09/21 1414     (approximate)  I have reviewed the triage vital signs and the nursing notes.  HISTORY  Chief Complaint Diarrhea, Abdominal Pain, and Near Syncope   HPI Cathy Coffey is a 80 y.o. femalewho presents to the ED for evaluation of diarrhea and dizziness.  Chart review indicates history of IBS, obesity and diverticulosis.  Patient presents to the ED from home, accompanied by her granddaughter, for evaluation of presyncopal dizziness in the setting of diarrhea for the past few days.  She reports 3-4 episodes of watery diarrhea per day for the past 3-4 days.  Denies hematochezia, melena, abdominal pain or emesis.  She reports that she always has a degree of lightheaded dizziness, but this has been worsened over the past few days alongside her diarrhea.  She denies any syncopal episodes, headache, falls or injuries.  She reports that she has had hypokalemia in the past and reports explicit concern for this. Denies dysuria or hematuria.   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   Pneumonia due to COVID-19 virus 07/09/2020    Patient Active Problem List   Diagnosis Date Noted   Abdominal pain 02/19/2021   Hyperlipidemia 01/31/2021   Prediabetes 01/31/2021   Morbid obesity ([place])    Asthma    Hypokalemia    Depression 10/26/2018   Left bundle branch block 02/18/2018   GERD (gastroesophageal reflux disease) 11/13/2017   Osteoporosis 09/02/2017   Chronic knee pain 01/29/2017   OSA (obstructive sleep apnea) 01/29/2017   Tiredness 01/29/2017   Memory difficulty 11/21/2016    Atrophic vaginitis 05/13/2016   Lumbago 02/27/2015   Toenail deformity 02/27/2015   NSAID long-term use 11/21/2014   Hot flashes 04/22/2014   IBS (irritable bowel syndrome) 07/27/2013   Hypertension 07/18/2011   Hypothyroidism 07/18/2011    Past Surgical History:  Procedure Laterality Date   CHOLECYSTECTOMY     COLONOSCOPY  2011   HERNIA REPAIR     MOUTH SURGERY  09/2016   TUBAL LIGATION      Prior to Admission medications   Medication Sig Start Date End Date Taking? Authorizing Provider  amLODipine (NORVASC) 2.5 MG tablet Take 1 tablet (2.5 mg total) by mouth daily. 11/17/20   Leone Haven, MD  B Complex-C (SUPER B COMPLEX PO) Take 1 capsule by mouth.    [provider]  Calcium Carbonate-Vitamin D (CALCIUM-VITAMIN D) 500-200 MG-UNIT per tablet Take 3 tablets by mouth daily.     [provider]  desonide (DESOWEN) 0.05 % cream Apply topically 2 (two) times daily. 06/03/16   Jackolyn Confer, MD  DIGESTIVE AIDS MIXTURE PO Take by mouth.    [provider]  fluocinonide gel (LIDEX) 0.05 %  04/07/19   [provider]  furosemide (LASIX) 20 MG tablet TAKE 1 TABLET DAILY 10/31/20   Leone Haven, MD  Glucosamine HCl 1000 MG TABS Take 1 tablet by mouth.    [provider]  lactulose (CHRONULAC) 10 GM/15ML solution TAKE 15 MLS (10 G TOTAL) BY MOUTH DAILY AS NEEDED FOR MILD CONSTIPATION. Patient not  taking: Reported on 08/03/2021 04/16/21   Leone Haven, MD  levothyroxine (SYNTHROID) 100 MCG tablet TAKE 1 TABLET DAILY 08/06/21   Leone Haven, MD  Magnesium 250 MG TABS Take 500 mg by mouth.     [provider]  modafinil (PROVIGIL) 100 MG tablet Take 100 mg by mouth every morning. Patient not taking: Reported on 08/03/2021 01/17/21   [provider]  Multiple Vitamins-Minerals (PRESERVISION AREDS PO) Take 1 tablet by mouth daily.     [provider]  Omega-3 Fatty Acids (FISH OIL) 1000 MG CPDR Take  3,000 mg by mouth daily.     [provider]  pantoprazole (PROTONIX) 40 MG tablet TAKE 1 TABLET TWICE DAILY 08/20/21   Leone Haven, MD  POTASSIUM PO Take 600 mg by mouth.    [provider]  Probiotic Product (TRUBIOTICS PO) Take 1 capsule by mouth.    [provider]  rosuvastatin (CRESTOR) 10 MG tablet Take 1 tablet (10 mg total) by mouth daily. 12/11/20   Leone Haven, MD  traMADol (ULTRAM) 50 MG tablet Take 50 mg by mouth 2 (two) times daily as needed. 01/12/21   [provider]    Allergies Moxifloxacin, Augmentin [amoxicillin-pot clavulanate], and Doxycycline  Family History  Problem Relation Age of Onset   Hypothyroidism Mother    Hypertension Mother    Dementia Father    Dementia Paternal Grandmother     Social History Social History   Tobacco Use   Smoking status: Never   Smokeless tobacco: Never  Vaping Use   Vaping Use: Never used  Substance Use Topics   Alcohol use: No   Drug use: No    Review of Systems  Constitutional: No fever/chills Eyes: No visual changes. ENT: No sore throat. Cardiovascular: Denies chest pain. Respiratory: Denies shortness of breath. Gastrointestinal: No abdominal pain.  no vomiting.   No constipation. Positive for nausea and diarrhea Genitourinary: Negative for dysuria. Musculoskeletal: Negative for back pain. Skin: Negative for rash. Neurological: Negative for headaches, focal weakness or numbness.  ____________________________________________   PHYSICAL EXAM:  VITAL SIGNS: Vitals:   10/09/21 1150 10/09/21 1505  BP: (!) 153/87 (!) 162/77  Pulse: 84 88  Resp: 16 20  Temp: 98.3 F (36.8 C)   SpO2: 92% 99%     Constitutional: Alert and oriented. Well appearing and in no acute distress. Eyes: Conjunctivae are normal. PERRL. EOMI. Head: Atraumatic. Nose: No congestion/rhinnorhea. Mouth/Throat: Mucous membranes are moist.  Oropharynx non-erythematous. Neck: No stridor. No  cervical spine tenderness to palpation. Cardiovascular: Normal rate, regular rhythm. Grossly normal heart sounds.  Good peripheral circulation. Respiratory: Normal respiratory effort.  No retractions. Lungs CTAB. Gastrointestinal: Soft , nondistended, nontender to palpation. No CVA tenderness. Benign throughout without tenderness. Musculoskeletal: No lower extremity tenderness nor edema.  No joint effusions. No signs of acute trauma. Neurologic:  Normal speech and language. No gross focal neurologic deficits are appreciated. No gait instability noted. Cranial nerves II through XII intact 5/5 strength and sensation in all 4 extremities Skin:  Skin is warm, dry and intact. No rash noted. Psychiatric: Mood and affect are normal. Speech and behavior are normal. ____________________________________________   LABS (all labs ordered are listed, but only abnormal results are displayed)  Labs Reviewed  BASIC METABOLIC PANEL - Abnormal; Notable for the following components:      Result Value   Glucose, Bld 109 (*)    All other components within normal limits  CBC  URINALYSIS, ROUTINE W  REFLEX MICROSCOPIC   ____________________________________________  12 Lead EKG  Sinus rhythm, rate of 80 bpm.  Normal axis.  Left bundle.  No STEMI per S[place] criteria. ____________________________________________  RADIOLOGY  ED MD interpretation:    Official radiology report(s): No results found.  ____________________________________________   PROCEDURES and INTERVENTIONS  Procedure(s) performed (including Critical Care):  Procedures  Medications - No data to display  ____________________________________________   MDM / ED COURSE   80 year old female presents to the ED with a few days of presyncopal dizziness in the setting of diarrhea, suspicious for mild dehydration, and amenable to outpatient management.  She looks clinically well and has normal vital signs on room air.  Blood work is  benign without electrolyte derangements or significant pathology.  Urine without infectious features.  Benign abdomen without indications for CT imaging.  We discussed management of dehydration at home and return precautions for the ED.     ____________________________________________   FINAL CLINICAL IMPRESSION(S) / ED DIAGNOSES  Final diagnoses:  Diarrhea, unspecified type  Dehydration  Near syncope     ED Discharge Orders     None        Shiori Adcox Tamala Julian   Note:  This document was prepared using Dragon voice recognition software and may include unintentional dictation errors.    Vladimir Crofts, MD 10/09/21 [phone]

## 2021-10-09 NOTE — ED Triage Notes (Signed)
Patient presents with near syncope, diarrhea, and abdominal discomfort since 10/06/21

## 2021-10-19 ENCOUNTER — Encounter: Payer: Self-pay | Admitting: Family Medicine

## 2021-10-19 ENCOUNTER — Ambulatory Visit (INDEPENDENT_AMBULATORY_CARE_PROVIDER_SITE_OTHER): Payer: Medicare HMO | Admitting: Family Medicine

## 2021-10-19 ENCOUNTER — Other Ambulatory Visit: Payer: Self-pay

## 2021-10-19 DIAGNOSIS — R252 Cramp and spasm: Secondary | ICD-10-CM | POA: Insufficient documentation

## 2021-10-19 DIAGNOSIS — A09 Infectious gastroenteritis and colitis, unspecified: Secondary | ICD-10-CM

## 2021-10-19 DIAGNOSIS — I10 Essential (primary) hypertension: Secondary | ICD-10-CM

## 2021-10-19 DIAGNOSIS — R197 Diarrhea, unspecified: Secondary | ICD-10-CM | POA: Insufficient documentation

## 2021-10-19 MED ORDER — AMLODIPINE BESYLATE 5 MG PO TABS: 5.0000 mg | ORAL_TABLET | Freq: Every day | ORAL | 3 refills | Status: DC

## 2021-10-19 NOTE — Assessment & Plan Note (Signed)
Suspect infectious in nature.  She has improved significantly.  She will monitor.  I discussed her labs from the hospital with her.

## 2021-10-19 NOTE — Assessment & Plan Note (Signed)
Well-controlled.  She will continue amlodipine 5 mg once daily. 

## 2021-10-19 NOTE — Patient Instructions (Signed)
Nice to see you. Please continue with amlodipine 5 mg once daily.

## 2021-10-19 NOTE — Progress Notes (Signed)
[name], MD Phone: [phone]  Cathy Coffey is a 80 y.o. female who presents today for f/u.  HYPERTENSION Disease Monitoring Home BP Monitoring 110s-131/69-80 Chest pain- no    Dyspnea- no Medications Compliance-  taking amlodipine 5 mg daily.   Edema- no BMET    Component Value Date/Time   NA 139 10/09/2021 1152   NA 141 11/15/2014 1314   K 4.1 10/09/2021 1152   K 3.9 11/15/2014 1314   CL 106 10/09/2021 1152   CL 105 11/15/2014 1314   CO2 26 10/09/2021 1152   CO2 29 11/15/2014 1314   GLUCOSE 109 (H) 10/09/2021 1152   GLUCOSE 105 (H) 11/15/2014 1314   BUN 22 10/09/2021 1152   BUN 17 11/15/2014 1314   CREATININE 0.80 10/09/2021 1152   CREATININE 0.94 11/15/2014 1314   CALCIUM 9.2 10/09/2021 1152   CALCIUM 8.8 11/15/2014 1314   GFRNONAA >60 10/09/2021 1152   GFRNONAA >60 11/15/2014 1314   GFRNONAA >60 02/03/2013 1943   GFRAA >60 07/16/2020 1454   GFRAA >60 11/15/2014 1314   GFRAA >60 02/03/2013 1943   The patient was seen in the emergency department for a presyncopal episode in the setting of diarrhea and dehydration.  She notes the diarrhea has resolved and she feels back to her baseline. She does note occasional cramps in her feet. Reports drinking plenty of water.   Social History   Tobacco Use  Smoking Status Never  Smokeless Tobacco Never    Current Outpatient Medications on File Prior to Visit  Medication Sig Dispense Refill   B Complex-C (SUPER B COMPLEX PO) Take 1 capsule by mouth.     Calcium Carbonate-Vitamin D (CALCIUM-VITAMIN D) 500-200 MG-UNIT per tablet Take 3 tablets by mouth daily.      desonide (DESOWEN) 0.05 % cream Apply topically 2 (two) times daily. 30 g 0   DIGESTIVE AIDS MIXTURE PO Take by mouth.     fluocinonide gel (LIDEX) 0.05 %      furosemide (LASIX) 20 MG tablet TAKE 1 TABLET DAILY 90 tablet 3   Glucosamine HCl 1000 MG TABS Take 1 tablet by mouth.     levothyroxine (SYNTHROID) 100 MCG tablet TAKE 1 TABLET DAILY 90 tablet  3   Magnesium 250 MG TABS Take 500 mg by mouth.      Multiple Vitamins-Minerals (PRESERVISION AREDS PO) Take 1 tablet by mouth daily.      Omega-3 Fatty Acids (FISH OIL) 1000 MG CPDR Take 3,000 mg by mouth daily.      pantoprazole (PROTONIX) 40 MG tablet TAKE 1 TABLET TWICE DAILY 180 tablet 1   POTASSIUM PO Take 600 mg by mouth.     Probiotic Product (TRUBIOTICS PO) Take 1 capsule by mouth.     rosuvastatin (CRESTOR) 10 MG tablet Take 1 tablet (10 mg total) by mouth daily. 90 tablet 3   traMADol (ULTRAM) 50 MG tablet Take 50 mg by mouth 2 (two) times daily as needed.     No current facility-administered medications on file prior to visit.     ROS see history of present illness  Objective  Physical Exam Vitals:   10/19/21 1516  BP: 118/70  Pulse: 77  Temp: 97.6 F (36.4 C)  SpO2: 99%    BP Readings from Last 3 Encounters:  10/19/21 118/70  10/09/21 (!) 162/77  08/03/21 110/70   Wt Readings from Last 3 Encounters:  10/19/21 166 lb 12.8 oz (75.7 kg)  10/09/21 160 lb (72.6 kg)  08/03/21 163  lb 6.4 oz (74.1 kg)    Physical Exam Constitutional:      General: She is not in acute distress.    Appearance: She is not diaphoretic.  Cardiovascular:     Rate and Rhythm: Normal rate and regular rhythm.     Heart sounds: Normal heart sounds.  Pulmonary:     Effort: Pulmonary effort is normal.     Breath sounds: Normal breath sounds.  Skin:    General: Skin is warm and dry.  Neurological:     Mental Status: She is alert.     Assessment/Plan: Please see individual problem list.  Problem List Items Addressed This Visit     Hypertension (Chronic)    Well-controlled.  She will continue amlodipine 5 mg once daily.      Relevant Medications   amLODipine (NORVASC) 5 MG tablet   Diarrhea    Suspect infectious in nature.  She has improved significantly.  She will monitor.  I discussed her labs from the hospital with her.      Foot cramps    Discussed adequate hydration.  Advised stretching. Discussed she could try a teaspoon of yellow mustard as well.        Return in about 6 months (around 04/19/2022) for Hypertension.  This visit occurred during the SARS-CoV-2 public health emergency.  Safety protocols were in place, including screening questions prior to the visit, additional usage of staff PPE, and extensive cleaning of exam room while observing appropriate contact time as indicated for disinfecting solutions.    [name], MD [place]

## 2021-10-19 NOTE — Assessment & Plan Note (Signed)
Discussed adequate hydration. Advised stretching. Discussed she could try a teaspoon of yellow mustard as well.

## 2021-11-07 ENCOUNTER — Ambulatory Visit (INDEPENDENT_AMBULATORY_CARE_PROVIDER_SITE_OTHER): Payer: Medicare HMO

## 2021-11-07 VITALS — BP 110/75 | HR 84 | Ht 61.0 in | Wt 166.0 lb

## 2021-11-07 DIAGNOSIS — Z0000 Encounter for general adult medical examination without abnormal findings: Secondary | ICD-10-CM | POA: Diagnosis not present

## 2021-11-07 NOTE — Patient Instructions (Addendum)
Cathy Coffey , Thank you for taking time to come for your Medicare Wellness Visit. I appreciate your ongoing commitment to your health goals. Please review the following plan we discussed and let me know if I can assist you in the future.   These are the goals we discussed:  Goals       Patient Stated     I want to lower my cholesterol (pt-stated)      Increase activity as tolerated Healthy diet      Other     Increase physical activity      Resume exercises at the gym        This is a list of the screening recommended for you and due dates:  Health Maintenance  Topic Date Due   Zoster (Shingles) Vaccine (1 of 2) 02/05/2022*   Tetanus Vaccine  11/07/2022*   Pneumonia Vaccine  Completed   Flu Shot  Completed   DEXA scan (bone density measurement)  Completed   HPV Vaccine  Aged Out   Colon Cancer Screening  Discontinued   COVID-19 Vaccine  Discontinued   Hepatitis C Screening: USPSTF Recommendation to screen - Ages 67-79 yo.  Discontinued  *Topic was postponed. The date shown is not the original due date.    Advanced directives: not yet completed  Conditions/risks identified: none new  Follow up in one year for your annual wellness visit    Preventive Care 65 Years and Older, Female Preventive care refers to lifestyle choices and visits with your health care provider that can promote health and wellness. What does preventive care include? A yearly physical exam. This is also called an annual well check. Dental exams once or twice a year. Routine eye exams. Ask your health care provider how often you should have your eyes checked. Personal lifestyle choices, including: Daily care of your teeth and gums. Regular physical activity. Eating a healthy diet. Avoiding tobacco and drug use. Limiting alcohol use. Practicing safe sex. Taking low-dose aspirin every day. Taking vitamin and mineral supplements as recommended by your health care provider. What happens during  an annual well check? The services and screenings done by your health care provider during your annual well check will depend on your age, overall health, lifestyle risk factors, and family history of disease. Counseling  Your health care provider may ask you questions about your: Alcohol use. Tobacco use. Drug use. Emotional well-being. Home and relationship well-being. Sexual activity. Eating habits. History of falls. Memory and ability to understand (cognition). Work and work Statistician. Reproductive health. Screening  You may have the following tests or measurements: Height, weight, and BMI. Blood pressure. Lipid and cholesterol levels. These may be checked every 5 years, or more frequently if you are over 19 years old. Skin check. Lung cancer screening. You may have this screening every year starting at age 44 if you have a 30-pack-year history of smoking and currently smoke or have quit within the past 15 years. Fecal occult blood test (FOBT) of the stool. You may have this test every year starting at age 7. Flexible sigmoidoscopy or colonoscopy. You may have a sigmoidoscopy every 5 years or a colonoscopy every 10 years starting at age 75. Hepatitis C blood test. Hepatitis B blood test. Sexually transmitted disease (STD) testing. Diabetes screening. This is done by checking your blood sugar (glucose) after you have not eaten for a while (fasting). You may have this done every 1-3 years. Bone density scan. This is done to screen for  osteoporosis. You may have this done starting at age 54. Mammogram. This may be done every 1-2 years. Talk to your health care provider about how often you should have regular mammograms. Talk with your health care provider about your test results, treatment options, and if necessary, the need for more tests. Vaccines  Your health care provider may recommend certain vaccines, such as: Influenza vaccine. This is recommended every year. Tetanus,  diphtheria, and acellular pertussis (Tdap, Td) vaccine. You may need a Td booster every 10 years. Zoster vaccine. You may need this after age 77. Pneumococcal 13-valent conjugate (PCV13) vaccine. One dose is recommended after age 39. Pneumococcal polysaccharide (PPSV23) vaccine. One dose is recommended after age 46. Talk to your health care provider about which screenings and vaccines you need and how often you need them. This information is not intended to replace advice given to you by your health care provider. Make sure you discuss any questions you have with your health care provider. Document Released: 11/24/2015 Document Revised: 07/17/2016 Document Reviewed: 08/29/2015 Elsevier Interactive Patient Education  2017 [place] Prevention in the Home Falls can cause injuries. They can happen to people of all ages. There are many things you can do to make your home safe and to help prevent falls. What can I do on the outside of my home? Regularly fix the edges of walkways and driveways and fix any cracks. Remove anything that might make you trip as you walk through a door, such as a raised step or threshold. Trim any bushes or trees on the path to your home. Use bright outdoor lighting. Clear any walking paths of anything that might make someone trip, such as rocks or tools. Regularly check to see if handrails are loose or broken. Make sure that both sides of any steps have handrails. Any raised decks and porches should have guardrails on the edges. Have any leaves, snow, or ice cleared regularly. Use sand or salt on walking paths during winter. Clean up any spills in your garage right away. This includes oil or grease spills. What can I do in the bathroom? Use night lights. Install grab bars by the toilet and in the tub and shower. Do not use towel bars as grab bars. Use non-skid mats or decals in the tub or shower. If you need to sit down in the shower, use a plastic,  non-slip stool. Keep the floor dry. Clean up any water that spills on the floor as soon as it happens. Remove soap buildup in the tub or shower regularly. Attach bath mats securely with double-sided non-slip rug tape. Do not have throw rugs and other things on the floor that can make you trip. What can I do in the bedroom? Use night lights. Make sure that you have a light by your bed that is easy to reach. Do not use any sheets or blankets that are too big for your bed. They should not hang down onto the floor. Have a firm chair that has side arms. You can use this for support while you get dressed. Do not have throw rugs and other things on the floor that can make you trip. What can I do in the kitchen? Clean up any spills right away. Avoid walking on wet floors. Keep items that you use a lot in easy-to-reach places. If you need to reach something above you, use a strong step stool that has a grab bar. Keep electrical cords out of the way. Do not  use floor polish or wax that makes floors slippery. If you must use wax, use non-skid floor wax. Do not have throw rugs and other things on the floor that can make you trip. What can I do with my stairs? Do not leave any items on the stairs. Make sure that there are handrails on both sides of the stairs and use them. Fix handrails that are broken or loose. Make sure that handrails are as long as the stairways. Check any carpeting to make sure that it is firmly attached to the stairs. Fix any carpet that is loose or worn. Avoid having throw rugs at the top or bottom of the stairs. If you do have throw rugs, attach them to the floor with carpet tape. Make sure that you have a light switch at the top of the stairs and the bottom of the stairs. If you do not have them, ask someone to add them for you. What else can I do to help prevent falls? Wear shoes that: Do not have high heels. Have rubber bottoms. Are comfortable and fit you well. Are closed  at the toe. Do not wear sandals. If you use a stepladder: Make sure that it is fully opened. Do not climb a closed stepladder. Make sure that both sides of the stepladder are locked into place. Ask someone to hold it for you, if possible. Clearly mark and make sure that you can see: Any grab bars or handrails. First and last steps. Where the edge of each step is. Use tools that help you move around (mobility aids) if they are needed. These include: Canes. Walkers. Scooters. Crutches. Turn on the lights when you go into a dark area. Replace any light bulbs as soon as they burn out. Set up your furniture so you have a clear path. Avoid moving your furniture around. If any of your floors are uneven, fix them. If there are any pets around you, be aware of where they are. Review your medicines with your doctor. Some medicines can make you feel dizzy. This can increase your chance of falling. Ask your doctor what other things that you can do to help prevent falls. This information is not intended to replace advice given to you by your health care provider. Make sure you discuss any questions you have with your health care provider. Document Released: 08/24/2009 Document Revised: 04/04/2016 Document Reviewed: 12/02/2014 Elsevier Interactive Patient Education  2017 [employer].

## 2021-11-07 NOTE — Progress Notes (Signed)
Subjective:   Cathy Coffey is a 80 y.o. female who presents for Medicare Annual (Subsequent) preventive examination.  Review of Systems    No ROS.  Medicare Wellness Virtual Visit.  Visual/audio telehealth visit, UTA vital signs.   See social history for additional risk factors.   Cardiac Risk Factors include: advanced age (>29men, >14 women);hypertension     Objective:    Today's Vitals   11/07/21 1243  BP: 110/75  Pulse: 84  Weight: 166 lb (75.3 kg)  Height: 5\' 1"  (1.549 m)   Body mass index is 31.37 kg/m.  Advanced Directives 11/07/2021 10/09/2021 11/06/2020 10/30/2020 07/09/2020 07/09/2020 10/22/2019  Does Patient Have a Medical Advance Directive? No No No No No No No  Would patient like information on creating a medical advance directive? No - Patient declined No - Patient declined No - Patient declined No - Guardian declined No - Patient declined No - Patient declined No - Patient declined    Current Medications (verified) Outpatient Encounter Medications as of 11/07/2021  Medication Sig   amLODipine (NORVASC) 5 MG tablet Take 1 tablet (5 mg total) by mouth daily.   B Complex-C (SUPER B COMPLEX PO) Take 1 capsule by mouth.   Calcium Carbonate-Vitamin D (CALCIUM-VITAMIN D) 500-200 MG-UNIT per tablet Take 3 tablets by mouth daily.    desonide (DESOWEN) 0.05 % cream Apply topically 2 (two) times daily.   DIGESTIVE AIDS MIXTURE PO Take by mouth.   fluocinonide gel (LIDEX) 0.05 %    furosemide (LASIX) 20 MG tablet TAKE 1 TABLET DAILY (Patient not taking: Reported on 11/07/2021)   Glucosamine HCl 1000 MG TABS Take 1 tablet by mouth.   levothyroxine (SYNTHROID) 100 MCG tablet TAKE 1 TABLET DAILY   Magnesium 250 MG TABS Take 500 mg by mouth.    Multiple Vitamins-Minerals (PRESERVISION AREDS PO) Take 1 tablet by mouth daily.    Omega-3 Fatty Acids (FISH OIL) 1000 MG CPDR Take 3,000 mg by mouth daily.    pantoprazole (PROTONIX) 40 MG tablet TAKE 1 TABLET TWICE DAILY  (Patient not taking: Reported on 11/07/2021)   POTASSIUM PO Take 600 mg by mouth.   Probiotic Product (TRUBIOTICS PO) Take 1 capsule by mouth.   rosuvastatin (CRESTOR) 10 MG tablet Take 1 tablet (10 mg total) by mouth daily. (Patient not taking: Reported on 11/07/2021)   traMADol (ULTRAM) 50 MG tablet Take 50 mg by mouth 2 (two) times daily as needed. (Patient not taking: Reported on 11/07/2021)   No facility-administered encounter medications on file as of 11/07/2021.    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   Pneumonia due to COVID-19 virus 07/09/2020   Past Surgical History:  Procedure Laterality Date   CHOLECYSTECTOMY     COLONOSCOPY  2011   HERNIA REPAIR     MOUTH SURGERY  09/2016   TUBAL LIGATION     Family History  Problem Relation Age of Onset   Hypothyroidism Mother    Hypertension Mother    Dementia Father    Dementia Paternal Grandmother    Social History   Socioeconomic History   Marital status: Divorced    Spouse name:  Not on file   Number of children: Not on file   Years of education: Not on file   Highest education level: Not on file  Occupational History   Not on file  Tobacco Use   Smoking status: Never   Smokeless tobacco: Never  Vaping Use   Vaping Use: Never used  Substance and Sexual Activity   Alcohol use: No   Drug use: No   Sexual activity: Never  Other Topics Concern   Not on file  Social History Narrative   Not on file   Social Determinants of Health   Financial Resource Strain: Low Risk    Difficulty of Paying Living Expenses: Not hard at all  Food Insecurity: No Food Insecurity   Worried About Paediatric nurse in the Last Year: Never true   [place] in the Last Year: Never true  Transportation Needs: No Transportation Needs   Lack of Transportation (Medical): No   Lack of Transportation (Non-Medical): No  Physical Activity: Not on file  Stress: No Stress Concern Present   Feeling of Stress : Not at all  Social Connections: Unknown   Frequency of Communication with Friends and Family: More than three times a week   Frequency of Social Gatherings with Friends and Family: More than three times a week   Attends Religious Services: Not on Electrical engineer or Organizations: Not on file   Attends Archivist Meetings: Not on file   Marital Status: Divorced    Tobacco Counseling Counseling given: Not Answered   Clinical Intake:  Pre-visit preparation completed: Yes        Diabetes: No              Activities of Daily Living In your present state of health, do you have any difficulty performing the following activities: 11/07/2021  Hearing? N  Vision? N  Difficulty concentrating or making decisions? N  Walking or climbing stairs? N  Dressing or bathing? N  Doing errands, shopping? N  Preparing Food and eating ? N  Using the Toilet? N  In the past six months, have you accidently leaked urine? Y  Comment Managed with daily liner  Do you have problems with loss of bowel control? N  Managing your Medications? N  Managing your Finances? N  Housekeeping or managing your Housekeeping? N  Some recent data might be hidden    Patient Care Team: Leone Haven, MD as PCP - General (Family Medicine)  Indicate any recent Medical Services you may have received from other than [facility] providers in the past year (date may be approximate).     Assessment:   This is a routine wellness examination for Cathy Coffey.  Virtual Visit via Telephone Note  I connected with  Cathy Coffey on 11/07/21 at 12:30 PM EST by telephone and verified that I am  speaking with the correct person using two identifiers.  Persons participating in the virtual visit: patient/Nurse Health Advisor   I discussed the limitations, risks, security and privacy concerns of performing an evaluation and management service by telephone and the availability of in person appointments. The patient expressed understanding and agreed to proceed.  Interactive audio and video telecommunications were attempted between this nurse and patient, however failed, due to patient having technical difficulties OR patient did not have access to video capability.  We continued and completed visit with audio only.  Some vital signs may be absent or patient  reported.   Hearing/Vision screen Hearing Screening - Comments:: Patient is able to hear conversational tones without difficulty. No issues reported. Vision Screening - Comments:: Followed by Dr. Matilde Sprang  Wears corrective lenses when reading  Cataract extraction, bilateral  Dietary issues and exercise activities discussed: Current Exercise Habits: Home exercise routine, Intensity: Mild Regular diet Good water intake   Goals Addressed             This Visit's Progress    Increase physical activity       Resume exercises at the gym       Depression Screen [facility] 2/9 Scores 11/07/2021 08/03/2021 11/06/2020 08/09/2020 10/22/2019 07/21/2019 12/08/2018  PHQ - 2 Score 0 0 0 0 0 0 1  PHQ- 9 Score - - - - - - 3  Exception Documentation - - - - - - -    Fall Risk Fall Risk  11/07/2021 08/03/2021 01/31/2021 11/06/2020 07/24/2020  Falls in the past year? 0 0 0 0 0  Number falls in past yr: 0 0 0 0 -  Injury with Fall? - - - 0 -  Follow up Falls evaluation completed Falls evaluation completed Falls evaluation completed Falls evaluation completed Falls evaluation completed    [place]: Home free of loose throw rugs in walkways, pet beds, electrical cords, etc? Yes  Adequate lighting in your home to  reduce risk of falls? Yes   ASSISTIVE DEVICES UTILIZED TO PREVENT FALLS: Life alert? No  Use of a cane, walker or w/c? No   TIMED UP AND GO: Was the test performed? No .   Cognitive Function: Patient is alert  MMSE - Mini Mental State Exam 10/19/2018 10/16/2016  Orientation to time 5 5  Orientation to Place 5 5  Registration 3 3  Attention/ Calculation 4 5  Recall 3 3  Language- name 2 objects 2 2  Language- repeat 1 1  Language- follow 3 step command 3 3  Language- read & follow direction 1 1  Write a sentence 1 1  Copy design 1 1  Total score 29 30     6CIT Screen 10/16/2017  What Year? 0 points  What month? 0 points  What time? 0 points  Count back from 20 0 points  Months in reverse 0 points  Repeat phrase 0 points  Total Score 0    Immunizations Immunization History  Administered Date(s) Administered   Fluad Quad(high Dose 65+) 07/21/2019, 08/09/2020, 08/03/2021   Influenza Split 10/01/2012   Influenza, High Dose Seasonal PF 08/21/2016, 09/02/2017, 10/19/2018   Influenza,inj,Quad PF,6+ Mos 07/27/2013, 11/02/2014, 11/09/2015   Pneumococcal Conjugate-13 11/02/2014   Pneumococcal Polysaccharide-23 10/01/2012   TDAP status: Due, Education has been provided regarding the importance of this vaccine. Advised may receive this vaccine at local pharmacy or Health Dept. Aware to provide a copy of the vaccination record if obtained from local pharmacy or Health Dept. Verbalized acceptance and understanding. Deferred.  Covid-19 vaccine status: Declined, Education has been provided regarding the importance of this vaccine but patient still declined. Advised may receive this vaccine at local pharmacy or Health Dept.or vaccine clinic. Aware to provide a copy of the vaccination record if obtained from local pharmacy or Health Dept. Verbalized acceptance and understanding.  Shingrix Completed?: No.    Education has been provided regarding the importance of this vaccine. Patient has  been advised to call insurance company to determine out of pocket expense if they have not yet received this vaccine. Advised  may also receive vaccine at local pharmacy or Health Dept. Verbalized acceptance and understanding.  Screening Tests Health Maintenance  Topic Date Due   Zoster Vaccines- Shingrix (1 of 2) 02/05/2022 (Originally 06/30/1960)   TETANUS/TDAP  11/07/2022 (Originally 06/30/1960)   Pneumonia Vaccine 16+ Years old  Completed   INFLUENZA VACCINE  Completed   DEXA SCAN  Completed   HPV VACCINES  Aged Out   COLONOSCOPY (Pts 45-110yrs Insurance coverage will need to be confirmed)  Discontinued   COVID-19 Vaccine  Discontinued   Hepatitis C Screening  Discontinued   Health Maintenance There are no preventive care reminders to display for this patient.  Mammogram-declined.  Lung Cancer Screening: (Low Dose CT Chest recommended if Age 31-80 years, 30 pack-year currently smoking OR have quit w/in 15years.) does not qualify.   Hepatitis C Screening: does not qualify.  Vision Screening: Recommended annual ophthalmology exams for early detection of glaucoma and other disorders of the eye.  Dental Screening: Recommended annual dental exams for proper oral hygiene  Community Resource Referral / Chronic Care Management: CRR required this visit?  No   CCM required this visit?  No      Plan:   Keep all routine maintenance appointments.   I have personally reviewed and noted the following in the patients chart:   Medical and social history Use of alcohol, tobacco or illicit drugs  Current medications and supplements including opioid prescriptions. Not taking opioid.  Functional ability and status Nutritional status Physical activity Advanced directives List of other physicians Hospitalizations, surgeries, and ER visits in previous 12 months Vitals Screenings to include cognitive, depression, and falls Referrals and appointments  In addition, I have reviewed and  discussed with patient certain preventive protocols, quality metrics, and best practice recommendations. A written personalized care plan for preventive services as well as general preventive health recommendations were provided to patient.     Varney Biles, LPN   30/07/2329

## 2021-12-12 ENCOUNTER — Telehealth: Payer: Self-pay | Admitting: Family Medicine

## 2021-12-12 NOTE — Telephone Encounter (Signed)
Patient called in stated slightly dizziness  appetites change not hungry  sinus  , call drop, tried to return call no answer, Made nina aware

## 2021-12-12 NOTE — Telephone Encounter (Signed)
I called the patient to see how she was feeling and I noticed she did not go to the ER as recommended by Access nurse and patient stated she went to the Er and it was a 2 hour wait so she left, patient stated when she got home she used the nettie pot for her sinuses, she gargled in salt water and she starting drinking a lot of plain waster and her BP went down to normal, last one was 120/80 and the dizziness went away and she feels much better.  I informed her that if it comes back she needs to be seen at a urgent care or hospital she she understood. Bradleigh Sonnen,cma

## 2021-12-12 NOTE — Telephone Encounter (Signed)
Patient called back in slightly dizziness  appetite has change , Patient is not hungry . Patient is having  sinus issues . I transfer patient to access nurse

## 2021-12-14 ENCOUNTER — Encounter: Payer: Self-pay | Admitting: Family Medicine

## 2021-12-14 ENCOUNTER — Ambulatory Visit (INDEPENDENT_AMBULATORY_CARE_PROVIDER_SITE_OTHER): Payer: Medicare HMO | Admitting: Family Medicine

## 2021-12-14 ENCOUNTER — Other Ambulatory Visit: Payer: Self-pay

## 2021-12-14 DIAGNOSIS — J069 Acute upper respiratory infection, unspecified: Secondary | ICD-10-CM | POA: Insufficient documentation

## 2021-12-14 NOTE — Patient Instructions (Signed)
Nice to see you. Please monitor your symptoms.  I suspect they will resolve over the several days.  If they do not please let us know.

## 2021-12-14 NOTE — Assessment & Plan Note (Signed)
The patient likely had a viral upper respiratory infection.  She has been improving.  Discussed the infection likely contributed to most of her symptoms.  Discussed that she should completely improve over the next 4 or so days.  If she does not improve or if she worsens she will let us know.  Discussed she could use Astelin over-the-counter as a nasal spray if needed.

## 2021-12-14 NOTE — Progress Notes (Signed)
[name], MD Phone: [phone]  Cathy Coffey is a 81 y.o. female who presents today for same-day visit.  Upper respiratory infection: The patient notes onset about 10 days ago.  This started with rhinorrhea and watery eyes.  She started taking antihistamines.  She notes her granddaughter was sick with similar symptoms and was tested for numerous illnesses with all the test being negative.  A few days after that she became nauseous and had some dizziness.  Her blood pressure was slightly up into the 140s over 80s.  She has been using salt water and a Nettie pot to help clear her sinuses.  She also started on Mucinex.  Her ears have been stopped up.  She notes she was using a nose spray for allergies.  She notes she has been improving quite a bit.  No abdominal pain.  She did vomit though she notes this was self-induced.  No fevers.  Social History   Tobacco Use  Smoking Status Never  Smokeless Tobacco Never    Current Outpatient Medications on File Prior to Visit  Medication Sig Dispense Refill   amLODipine (NORVASC) 5 MG tablet Take 1 tablet (5 mg total) by mouth daily. 90 tablet 3   B Complex-C (SUPER B COMPLEX PO) Take 1 capsule by mouth.     Calcium Carbonate-Vitamin D (CALCIUM-VITAMIN D) 500-200 MG-UNIT per tablet Take 3 tablets by mouth daily.      desonide (DESOWEN) 0.05 % cream Apply topically 2 (two) times daily. 30 g 0   DIGESTIVE AIDS MIXTURE PO Take by mouth.     fluocinonide gel (LIDEX) 0.05 %      furosemide (LASIX) 20 MG tablet TAKE 1 TABLET DAILY 90 tablet 3   Glucosamine HCl 1000 MG TABS Take 1 tablet by mouth.     levothyroxine (SYNTHROID) 100 MCG tablet TAKE 1 TABLET DAILY 90 tablet 3   Magnesium 250 MG TABS Take 500 mg by mouth.      Multiple Vitamins-Minerals (PRESERVISION AREDS PO) Take 1 tablet by mouth daily.      Omega-3 Fatty Acids (FISH OIL) 1000 MG CPDR Take 3,000 mg by mouth daily.      pantoprazole (PROTONIX) 40 MG tablet TAKE 1 TABLET TWICE  DAILY 180 tablet 1   POTASSIUM PO Take 600 mg by mouth.     Probiotic Product (TRUBIOTICS PO) Take 1 capsule by mouth.     rosuvastatin (CRESTOR) 10 MG tablet Take 1 tablet (10 mg total) by mouth daily. 90 tablet 3   traMADol (ULTRAM) 50 MG tablet Take 50 mg by mouth 2 (two) times daily as needed.     No current facility-administered medications on file prior to visit.     ROS see history of present illness  Objective  Physical Exam Vitals:   12/14/21 1322  BP: 120/78  Pulse: 74  Temp: 98.6 F (37 C)  SpO2: 99%    BP Readings from Last 3 Encounters:  12/14/21 120/78  11/07/21 110/75  10/19/21 118/70   Wt Readings from Last 3 Encounters:  12/14/21 165 lb (74.8 kg)  11/07/21 166 lb (75.3 kg)  10/19/21 166 lb 12.8 oz (75.7 kg)    Physical Exam Constitutional:      General: She is not in acute distress.    Appearance: She is not diaphoretic.  HENT:     Head: Normocephalic and atraumatic.     Right Ear: Tympanic membrane normal.     Left Ear: Tympanic membrane normal.  Cardiovascular:  Rate and Rhythm: Normal rate and regular rhythm.     Heart sounds: Normal heart sounds.  Pulmonary:     Effort: Pulmonary effort is normal.     Breath sounds: Normal breath sounds.  Lymphadenopathy:     Cervical: No cervical adenopathy.  Skin:    General: Skin is warm and dry.  Neurological:     Mental Status: She is alert.     Assessment/Plan: Please see individual problem list.  Problem List Items Addressed This Visit     Upper respiratory infection    The patient likely had a viral upper respiratory infection.  She has been improving.  Discussed the infection likely contributed to most of her symptoms.  Discussed that she should completely improve over the next 4 or so days.  If she does not improve or if she worsens she will let us know.  Discussed she could use Astelin over-the-counter as a nasal spray if needed.       Return if symptoms worsen or fail to  improve.  This visit occurred during the SARS-CoV-2 public health emergency.  Safety protocols were in place, including screening questions prior to the visit, additional usage of staff PPE, and extensive cleaning of exam room while observing appropriate contact time as indicated for disinfecting solutions.    [name], MD [place]

## 2022-01-15 DIAGNOSIS — M47816 Spondylosis without myelopathy or radiculopathy, lumbar region: Secondary | ICD-10-CM | POA: Diagnosis not present

## 2022-01-15 DIAGNOSIS — M5136 Other intervertebral disc degeneration, lumbar region: Secondary | ICD-10-CM | POA: Diagnosis not present

## 2022-01-24 DIAGNOSIS — M47816 Spondylosis without myelopathy or radiculopathy, lumbar region: Secondary | ICD-10-CM | POA: Diagnosis not present

## 2022-02-01 ENCOUNTER — Encounter: Payer: Self-pay | Admitting: Family Medicine

## 2022-02-01 ENCOUNTER — Ambulatory Visit (INDEPENDENT_AMBULATORY_CARE_PROVIDER_SITE_OTHER): Payer: Medicare HMO | Admitting: Family Medicine

## 2022-02-01 ENCOUNTER — Other Ambulatory Visit: Payer: Self-pay

## 2022-02-01 VITALS — BP 120/70 | HR 68 | Temp 98.2°F | Ht 61.0 in | Wt 167.0 lb

## 2022-02-01 DIAGNOSIS — K219 Gastro-esophageal reflux disease without esophagitis: Secondary | ICD-10-CM | POA: Diagnosis not present

## 2022-02-01 DIAGNOSIS — G8929 Other chronic pain: Secondary | ICD-10-CM | POA: Diagnosis not present

## 2022-02-01 DIAGNOSIS — R7303 Prediabetes: Secondary | ICD-10-CM | POA: Diagnosis not present

## 2022-02-01 DIAGNOSIS — E039 Hypothyroidism, unspecified: Secondary | ICD-10-CM | POA: Diagnosis not present

## 2022-02-01 DIAGNOSIS — E785 Hyperlipidemia, unspecified: Secondary | ICD-10-CM | POA: Diagnosis not present

## 2022-02-01 DIAGNOSIS — I10 Essential (primary) hypertension: Secondary | ICD-10-CM

## 2022-02-01 DIAGNOSIS — G4733 Obstructive sleep apnea (adult) (pediatric): Secondary | ICD-10-CM

## 2022-02-01 DIAGNOSIS — M545 Low back pain, unspecified: Secondary | ICD-10-CM | POA: Diagnosis not present

## 2022-02-01 NOTE — Assessment & Plan Note (Signed)
Seemingly has no symptoms related to this.  She has not been using the CPAP.  I discussed the risk of cardiovascular disease and heart arrhythmias with untreated sleep apnea.  Discussed the possibility of an oral device through her dentist.  She will check with them on this. ?

## 2022-02-01 NOTE — Assessment & Plan Note (Signed)
Chronic issue.  She will continue to see her specialist for this. ?

## 2022-02-01 NOTE — Assessment & Plan Note (Signed)
Well-controlled.  She will continue amlodipine 5 mg once daily and Lasix 20 mg daily. ?

## 2022-02-01 NOTE — Patient Instructions (Signed)
Nice to see you. ?We will check your thyroid function today. ?Please check with your dentist to see if they do oral devices for your sleep apnea. ?

## 2022-02-01 NOTE — Assessment & Plan Note (Signed)
The patient has not had a stroke or heart attack and thus the benefit of a statin may not outweigh the risk of a statin.  She will continue diet and exercise.  We will recheck her cholesterol in 6 months. ?

## 2022-02-01 NOTE — Assessment & Plan Note (Signed)
Continue Synthroid 100 mcg once daily.  Check TSH. ?

## 2022-02-01 NOTE — Progress Notes (Signed)
?[name], MD ?Phone: [phone] ? ?Cathy Coffey is a 81 y.o. female who presents today for f/u. ? ?HYPERTENSION ?Disease Monitoring ?Home BP Monitoring similar, though highest it gets is 387F systolic     Chest pain- no    Dyspnea- no ?Medications ?Compliance-  taking amlodipine, lasix.  Edema- no ?BMET ?   ?Component Value Date/Time  ? NA 139 10/09/2021 1152  ? NA 141 11/15/2014 1314  ? K 4.1 10/09/2021 1152  ? K 3.9 11/15/2014 1314  ? CL 106 10/09/2021 1152  ? CL 105 11/15/2014 1314  ? CO2 26 10/09/2021 1152  ? CO2 29 11/15/2014 1314  ? GLUCOSE 109 (H) 10/09/2021 1152  ? GLUCOSE 105 (H) 11/15/2014 1314  ? BUN 22 10/09/2021 1152  ? BUN 17 11/15/2014 1314  ? CREATININE 0.80 10/09/2021 1152  ? CREATININE 0.94 11/15/2014 1314  ? CALCIUM 9.2 10/09/2021 1152  ? CALCIUM 8.8 11/15/2014 1314  ? GFRNONAA >60 10/09/2021 1152  ? GFRNONAA >60 11/15/2014 1314  ? GFRNONAA >60 02/03/2013 1943  ? GFRAA >60 07/16/2020 1454  ? GFRAA >60 11/15/2014 1314  ? GFRAA >60 02/03/2013 1943  ? ?GERD:   ?Reflux symptoms: no   ?Abd pain: no   ?Blood in stool: no  ?Dysphagia: no   ?EGD: 12/15/14 gastritis  ?Medication: occasional tums, rarely has symptoms ? ?OSA ?CPAP use: not using, could not tolerate ?Hypersomnia: no ?Well rested: yes ?Has her own teeth.  ? ?HYPOTHYROIDISM ?Disease Monitoring ?Weight changes: no  Skin Changes: no Heat/Cold intolerance: no ? ?Medication Monitoring ?Compliance:  taking synthroid   ?Last TSH:   ?Lab Results  ?Component Value Date  ? TSH 2.12 08/03/2021  ? ?Hyperlipidemia: Patient is not taking Crestor.  She did not want to take it and wanted to work on diet and exercise.  No history of stroke or heart attack. ? ?Chronic back pain: She recently had a nerve ablation that has provided good benefit.  No discomfort at this time. ? ? ? ?Social History  ? ?Tobacco Use  ?Smoking Status Never  ?Smokeless Tobacco Never  ? ? ?Current Outpatient Medications on File Prior to Visit  ?Medication Sig Dispense Refill   ? amLODipine (NORVASC) 5 MG tablet Take 1 tablet (5 mg total) by mouth daily. 90 tablet 3  ? B Complex-C (SUPER B COMPLEX PO) Take 1 capsule by mouth.    ? Calcium Carbonate-Vitamin D (CALCIUM-VITAMIN D) 500-200 MG-UNIT per tablet Take 3 tablets by mouth daily.     ? desonide (DESOWEN) 0.05 % cream Apply topically 2 (two) times daily. 30 g 0  ? DIGESTIVE AIDS MIXTURE PO Take by mouth.    ? fluocinonide gel (LIDEX) 0.05 %     ? furosemide (LASIX) 20 MG tablet TAKE 1 TABLET DAILY 90 tablet 3  ? Glucosamine HCl 1000 MG TABS Take 1 tablet by mouth.    ? levothyroxine (SYNTHROID) 100 MCG tablet TAKE 1 TABLET DAILY 90 tablet 3  ? Magnesium 250 MG TABS Take 500 mg by mouth.     ? Multiple Vitamins-Minerals (PRESERVISION AREDS PO) Take 1 tablet by mouth daily.     ? Omega-3 Fatty Acids (FISH OIL) 1000 MG CPDR Take 3,000 mg by mouth daily.     ? pantoprazole (PROTONIX) 40 MG tablet TAKE 1 TABLET TWICE DAILY 180 tablet 1  ? POTASSIUM PO Take 600 mg by mouth.    ? Probiotic Product (TRUBIOTICS PO) Take 1 capsule by mouth.    ? traMADol (ULTRAM) 50  MG tablet Take 50 mg by mouth 2 (two) times daily as needed.    ? ?No current facility-administered medications on file prior to visit.  ? ? ? ?ROS see history of present illness ? ?Objective ? ?Physical Exam ?Vitals:  ? 02/01/22 0910  ?BP: 120/70  ?Pulse: 68  ?Temp: 98.2 ?F (36.8 ?C)  ?SpO2: 97%  ? ? ?BP Readings from Last 3 Encounters:  ?02/01/22 120/70  ?12/14/21 120/78  ?11/07/21 110/75  ? ?Wt Readings from Last 3 Encounters:  ?02/01/22 167 lb (75.8 kg)  ?12/14/21 165 lb (74.8 kg)  ?11/07/21 166 lb (75.3 kg)  ? ? ?Physical Exam ?Constitutional:   ?   General: She is not in acute distress. ?   Appearance: She is not diaphoretic.  ?Cardiovascular:  ?   Rate and Rhythm: Normal rate and regular rhythm.  ?   Heart sounds: Normal heart sounds.  ?Pulmonary:  ?   Effort: Pulmonary effort is normal.  ?   Breath sounds: Normal breath sounds.  ?Skin: ?   General: Skin is warm and dry.   ?Neurological:  ?   Mental Status: She is alert.  ? ? ? ?Assessment/Plan: Please see individual problem list. ? ?Problem List Items Addressed This Visit   ? ? Hypertension (Chronic)  ?  Well-controlled.  She will continue amlodipine 5 mg once daily and Lasix 20 mg daily. ?  ?  ? GERD (gastroesophageal reflux disease)  ?  Very rare symptoms that are responsive to Tums.  She will monitor and if this worsens let us know. ?  ?  ? Hyperlipidemia  ?  The patient has not had a stroke or heart attack and thus the benefit of a statin may not outweigh the risk of a statin.  She will continue diet and exercise.  We will recheck her cholesterol in 6 months. ?  ?  ? Relevant Orders  ? Comp Met (CMET)  ? Lipid panel  ? Hypothyroidism - Primary  ?  Continue Synthroid 100 mcg once daily.  Check TSH. ?  ?  ? Relevant Orders  ? TSH  ? TSH  ? Lumbago  ?  Chronic issue.  She will continue to see her specialist for this. ?  ?  ? OSA (obstructive sleep apnea)  ?  Seemingly has no symptoms related to this.  She has not been using the CPAP.  I discussed the risk of cardiovascular disease and heart arrhythmias with untreated sleep apnea.  Discussed the possibility of an oral device through her dentist.  She will check with them on this. ?  ?  ? Prediabetes  ? Relevant Orders  ? Hemoglobin A1c  ? ? ? ? ?Return in about 6 months (around 08/04/2022) for Follow-up with labs 2 days prior. ? ?This visit occurred during the SARS-CoV-2 public health emergency.  Safety protocols were in place, including screening questions prior to the visit, additional usage of staff PPE, and extensive cleaning of exam room while observing appropriate contact time as indicated for disinfecting solutions.  ? ? ?[name], MD ?[place] ? ?

## 2022-02-01 NOTE — Assessment & Plan Note (Signed)
Very rare symptoms that are responsive to Tums.  She will monitor and if this worsens let us know. ?

## 2022-02-05 LAB — TSH: TSH: 3.7 u[IU]/mL (ref 0.35–5.50)

## 2022-02-17 ENCOUNTER — Other Ambulatory Visit: Payer: Self-pay | Admitting: Family Medicine

## 2022-02-17 DIAGNOSIS — R1013 Epigastric pain: Secondary | ICD-10-CM

## 2022-02-27 DIAGNOSIS — M5136 Other intervertebral disc degeneration, lumbar region: Secondary | ICD-10-CM | POA: Diagnosis not present

## 2022-02-27 DIAGNOSIS — M47816 Spondylosis without myelopathy or radiculopathy, lumbar region: Secondary | ICD-10-CM | POA: Diagnosis not present

## 2022-04-24 ENCOUNTER — Ambulatory Visit (INDEPENDENT_AMBULATORY_CARE_PROVIDER_SITE_OTHER): Payer: Medicare HMO | Admitting: Family Medicine

## 2022-04-24 ENCOUNTER — Encounter: Payer: Self-pay | Admitting: Family Medicine

## 2022-04-24 ENCOUNTER — Telehealth: Payer: Self-pay | Admitting: Family Medicine

## 2022-04-24 VITALS — BP 120/70 | HR 84 | Temp 98.4°F | Ht 61.0 in | Wt 168.2 lb

## 2022-04-24 DIAGNOSIS — R1013 Epigastric pain: Secondary | ICD-10-CM

## 2022-04-24 NOTE — Telephone Encounter (Signed)
Patient was called and the appointment was changed to a in person visit, she is bloated and her stomach is swollen. Caden Fukushima,cma

## 2022-04-24 NOTE — Patient Instructions (Signed)
Nice to see you. We will get lab work today. We will get a CT scan scheduled for you. GI should contact you to schedule an evaluation as well. If you develop significant abdominal pain or lack of bowel movements and lack of passing gas you need to seek medical attention immediately in the emergency department.

## 2022-04-24 NOTE — Assessment & Plan Note (Signed)
Patient with epigastric discomfort and early satiety that has been intermittently ongoing for many years.  Discussed checking lab work to evaluate for underlying cause.  We will also order CT scan.  Discussed the need to see GI as well to consider endoscopy.  Orders were placed for all of these things.  She was counseled on seeking medical attention if she develops significant abdominal pain or lack of bowel movements and lack of passing gas.  Discussed that she does not seem to have a blockage at this time given that she is having bowel movements.  She does have some constipation symptoms and she has a saline enema that she was planning on using.  I advised that this would likely be okay given lack of improvement with stool softeners and magnesium.

## 2022-04-24 NOTE — Progress Notes (Signed)
[name], MD Phone: [phone]  Cathy Coffey is a 81 y.o. female who presents today for same-day visit.  Chronic abdominal bloating: Patient reports chronic symptoms that been going on for years.  She notes her epigastric area becomes uncomfortable and feels bloated.  She notes no nausea.  She notes it seems as though food gets stuck in this area and she makes her self throw up at times given the discomfort.  She notes having small bowel movements daily that are thin and small in length.  She notes that has been going on intermittently for years.  She notes she can only eat a small portion of a senior plate of food when she goes out to eat before getting fall.  She takes stool softeners and magnesium daily to help with constipation issues.  She has not seen GI in the past per her report.  Social History   Tobacco Use  Smoking Status Never  Smokeless Tobacco Never    Current Outpatient Medications on File Prior to Visit  Medication Sig Dispense Refill   amLODipine (NORVASC) 5 MG tablet Take 1 tablet (5 mg total) by mouth daily. 90 tablet 3   B Complex-C (SUPER B COMPLEX PO) Take 1 capsule by mouth.     Calcium Carbonate-Vitamin D (CALCIUM-VITAMIN D) 500-200 MG-UNIT per tablet Take 3 tablets by mouth daily.      desonide (DESOWEN) 0.05 % cream Apply topically 2 (two) times daily. 30 g 0   DIGESTIVE AIDS MIXTURE PO Take by mouth.     fluocinonide gel (LIDEX) 0.05 %      furosemide (LASIX) 20 MG tablet TAKE 1 TABLET DAILY 90 tablet 3   Glucosamine HCl 1000 MG TABS Take 1 tablet by mouth.     levothyroxine (SYNTHROID) 100 MCG tablet TAKE 1 TABLET DAILY 90 tablet 3   Magnesium 250 MG TABS Take 500 mg by mouth.      Multiple Vitamins-Minerals (PRESERVISION AREDS PO) Take 1 tablet by mouth daily.      Omega-3 Fatty Acids (FISH OIL) 1000 MG CPDR Take 3,000 mg by mouth daily.      pantoprazole (PROTONIX) 40 MG tablet TAKE 1 TABLET BY MOUTH EVERY DAY 90 tablet 3   POTASSIUM PO Take  600 mg by mouth.     Probiotic Product (TRUBIOTICS PO) Take 1 capsule by mouth.     traMADol (ULTRAM) 50 MG tablet Take 50 mg by mouth 2 (two) times daily as needed.     No current facility-administered medications on file prior to visit.     ROS see history of present illness  Objective  Physical Exam Vitals:   04/24/22 1631  BP: 120/70  Pulse: 84  Temp: 98.4 F (36.9 C)  SpO2: 96%    BP Readings from Last 3 Encounters:  04/24/22 120/70  02/01/22 120/70  12/14/21 120/78   Wt Readings from Last 3 Encounters:  04/24/22 168 lb 3.2 oz (76.3 kg)  02/01/22 167 lb (75.8 kg)  12/14/21 165 lb (74.8 kg)    Physical Exam Constitutional:      General: She is not in acute distress. Pulmonary:     Effort: Pulmonary effort is normal.  Abdominal:     General: Bowel sounds are normal. There is no distension.     Palpations: Abdomen is soft. There is no mass.     Tenderness: There is abdominal tenderness (Mild epigastric tenderness to palpation). There is no guarding or rebound.     Hernia: No hernia  is present.  Neurological:     Mental Status: She is alert.      Assessment/Plan: Please see individual problem list.  Problem List Items Addressed This Visit     Epigastric pain - Primary    Patient with epigastric discomfort and early satiety that has been intermittently ongoing for many years.  Discussed checking lab work to evaluate for underlying cause.  We will also order CT scan.  Discussed the need to see GI as well to consider endoscopy.  Orders were placed for all of these things.  She was counseled on seeking medical attention if she develops significant abdominal pain or lack of bowel movements and lack of passing gas.  Discussed that she does not seem to have a blockage at this time given that she is having bowel movements.  She does have some constipation symptoms and she has a saline enema that she was planning on using.  I advised that this would likely be okay given  lack of improvement with stool softeners and magnesium.      Relevant Orders   Lipase   Comp Met (CMET)   CBC   CT Abdomen Pelvis W Contrast   Ambulatory referral to Gastroenterology    Return in about 4 weeks (around 05/22/2022) for abdominal issues.   [name], MD [place]

## 2022-04-24 NOTE — Telephone Encounter (Signed)
LVM for patient to call back.   Deakin Lacek,cma  

## 2022-04-24 NOTE — Telephone Encounter (Signed)
This patient is on the schedule for a virtual visit this afternoon. Please call her and get details about her symptoms. Based on her complaint on the schedule she may need to be evaluated in person.

## 2022-04-25 LAB — CBC
HCT: 40.2 % (ref 36.0–46.0)
Hemoglobin: 13.1 g/dL (ref 12.0–15.0)
MCHC: 32.6 g/dL (ref 30.0–36.0)
MCV: 89.9 fl (ref 78.0–100.0)
Platelets: 185 10*3/uL (ref 150.0–400.0)
RBC: 4.47 Mil/uL (ref 3.87–5.11)
RDW: 13.9 % (ref 11.5–15.5)
WBC: 6.1 10*3/uL (ref 4.0–10.5)

## 2022-04-25 LAB — COMPREHENSIVE METABOLIC PANEL
ALT: 15 U/L (ref 0–35)
AST: 19 U/L (ref 0–37)
Albumin: 4.3 g/dL (ref 3.5–5.2)
Alkaline Phosphatase: 61 U/L (ref 39–117)
BUN: 23 mg/dL (ref 6–23)
CO2: 24 mEq/L (ref 19–32)
Calcium: 9.8 mg/dL (ref 8.4–10.5)
Chloride: 105 mEq/L (ref 96–112)
Creatinine, Ser: 0.96 mg/dL (ref 0.40–1.20)
GFR: 56.55 mL/min — ABNORMAL LOW (ref >60.00)
Glucose, Bld: 121 mg/dL — ABNORMAL HIGH (ref 70–99)
Potassium: 4.1 mEq/L (ref 3.5–5.1)
Sodium: 140 mEq/L (ref 135–145)
Total Bilirubin: 0.3 mg/dL (ref 0.2–1.2)
Total Protein: 6.9 g/dL (ref 6.0–8.3)

## 2022-04-25 LAB — LIPASE: Lipase: 14 U/L (ref 11.0–59.0)

## 2022-05-01 ENCOUNTER — Ambulatory Visit
Admission: RE | Admit: 2022-05-01 | Discharge: 2022-05-01 | Disposition: A | Discharge status: Home / Self Care | Payer: Medicare HMO | Source: Ambulatory Visit | Attending: Family Medicine | Admitting: Family Medicine

## 2022-05-01 DIAGNOSIS — R1013 Epigastric pain: Secondary | ICD-10-CM | POA: Insufficient documentation

## 2022-05-01 DIAGNOSIS — N289 Disorder of kidney and ureter, unspecified: Secondary | ICD-10-CM | POA: Diagnosis not present

## 2022-05-01 DIAGNOSIS — K5730 Diverticulosis of large intestine without perforation or abscess without bleeding: Secondary | ICD-10-CM | POA: Diagnosis not present

## 2022-05-01 MED ORDER — IOHEXOL 300 MG/ML  SOLN
100.0000 mL | Freq: Once | INTRAMUSCULAR | Status: AC | PRN
  Administered 2022-05-01: 100 mL via INTRAVENOUS

## 2022-05-06 ENCOUNTER — Encounter: Payer: Self-pay | Admitting: Family Medicine

## 2022-05-06 ENCOUNTER — Ambulatory Visit (INDEPENDENT_AMBULATORY_CARE_PROVIDER_SITE_OTHER): Payer: Medicare HMO | Admitting: Family Medicine

## 2022-05-06 ENCOUNTER — Other Ambulatory Visit: Payer: Self-pay | Admitting: Family Medicine

## 2022-05-06 DIAGNOSIS — N281 Cyst of kidney, acquired: Secondary | ICD-10-CM

## 2022-05-06 DIAGNOSIS — E669 Obesity, unspecified: Secondary | ICD-10-CM | POA: Diagnosis not present

## 2022-05-06 DIAGNOSIS — E66811 Obesity, class 1: Secondary | ICD-10-CM

## 2022-05-06 DIAGNOSIS — R1013 Epigastric pain: Secondary | ICD-10-CM

## 2022-05-06 DIAGNOSIS — R933 Abnormal findings on diagnostic imaging of other parts of digestive tract: Secondary | ICD-10-CM

## 2022-05-06 DIAGNOSIS — N2889 Other specified disorders of kidney and ureter: Secondary | ICD-10-CM

## 2022-05-06 NOTE — Assessment & Plan Note (Signed)
Discussed the need for a colonoscopy to evaluate a specific cause for this.  I will send this note to the GI physician she is post to see to see if they may be able to see her sooner than October.

## 2022-05-06 NOTE — Assessment & Plan Note (Addendum)
Complex cyst in her right kidney.  MRI has been ordered to further characterize the lesion.

## 2022-05-07 ENCOUNTER — Telehealth: Payer: Self-pay | Admitting: Family Medicine

## 2022-05-07 NOTE — Telephone Encounter (Signed)
I called and informed the patient that GI will be calling her soon for a sooner appointment and if she does not hear from them within a week to let us know and she understood. Atreyu Mak,cma

## 2022-05-07 NOTE — Telephone Encounter (Signed)
Let the patient know GI should be contacting her to set up a much sooner appointment for evaluation of the findings on her CT scan.  If she does not hear from them this week she should let us know.  Thanks.

## 2022-05-15 ENCOUNTER — Ambulatory Visit: Payer: Medicare HMO | Admitting: Gastroenterology

## 2022-05-15 ENCOUNTER — Telehealth: Payer: Self-pay | Admitting: Gastroenterology

## 2022-05-15 ENCOUNTER — Telehealth: Payer: Self-pay

## 2022-05-15 ENCOUNTER — Encounter: Payer: Self-pay | Admitting: Gastroenterology

## 2022-05-15 VITALS — BP 134/78 | HR 64 | Temp 98.1°F | Ht 60.0 in | Wt 165.6 lb

## 2022-05-15 DIAGNOSIS — R1013 Epigastric pain: Secondary | ICD-10-CM | POA: Diagnosis not present

## 2022-05-15 DIAGNOSIS — R933 Abnormal findings on diagnostic imaging of other parts of digestive tract: Secondary | ICD-10-CM

## 2022-05-15 MED ORDER — NA SULFATE-K SULFATE-MG SULF 17.5-3.13-1.6 GM/177ML PO SOLN: 1.0000 | Freq: Once | ORAL | 0 refills | Status: AC

## 2022-05-15 MED ORDER — POLYETHYLENE GLYCOL 3350 17 G PO PACK: 17.0000 g | PACK | Freq: Two times a day (BID) | ORAL | 0 refills | Status: DC

## 2022-05-15 NOTE — Progress Notes (Signed)
Jonathon Bellows MD, MRCP(U.K) [address]  [facility]  [place], [place] [address]  Main: [phone]  Fax: [phone]   Gastroenterology Consultation  Referring Provider:     Leone Haven, MD Primary Care Physician:  Leone Haven, MD Primary Gastroenterologist:  Dr. Jonathon Bellows  Reason for Consultation:     Epigastric pain        HPI:   Cathy Coffey is a 81 y.o. y/o female referred for consultation & management  by Dr. Caryl Bis, Angela Adam, MD.     She states that she has been having epigastric pain for a few months.  Radiates to the right side at times.  She has had a prior cholecystectomy.  Pain is usually worse right after he eats and a few hours later has bloating.  She also has constipation which has been ongoing for many years but has recently got worse.  Positive stool which is soft but unable to push out.  Has had a few episodes of black-colored stools recently.  She has been taking ibuprofen on a daily basis for many years for back pain.  On pantoprazole but unsure if she has been taking it regularly.  No unintentional weight loss.  No recent colonoscopy.  She cut down on the ibuprofen and her abdominal pain has improved. 05/01/2022 segment of proximal transverse colon appears to demonstrate wall thickening and luminal narrowing compared to colon more proximally and distally endoscopy evaluation required.  11 mm complex cyst of the kidney also noted.  04/24/2022 hemoglobin 13.1 g, creatinine 0.96 As per epic last colonoscopy was in 2011. Neck Past Medical History:  Diagnosis Date   Asthma    COVID-19 virus infection 10/2020   Diverticulosis    Hypertension    Hypothyroidism    IBS (irritable bowel syndrome)    Lichen planus    Morbid obesity ([place])    Pancreatitis due to common bile duct stone 2000   Pneumonia due to COVID-19 virus 07/09/2020    Past Surgical History:  Procedure Laterality Date   CHOLECYSTECTOMY     COLONOSCOPY  2011   HERNIA REPAIR      MOUTH SURGERY  09/2016   TUBAL LIGATION      Prior to Admission medications   Medication Sig Start Date End Date Taking? Authorizing Provider  amLODipine (NORVASC) 5 MG tablet Take 1 tablet (5 mg total) by mouth daily. 10/19/21   Leone Haven, MD  B Complex-C (SUPER B COMPLEX PO) Take 1 capsule by mouth.    [provider]  Calcium Carbonate-Vitamin D (CALCIUM-VITAMIN D) 500-200 MG-UNIT per tablet Take 3 tablets by mouth daily.     [provider]  desonide (DESOWEN) 0.05 % cream Apply topically 2 (two) times daily. 06/03/16   Jackolyn Confer, MD  DIGESTIVE AIDS MIXTURE PO Take by mouth.    [provider]  fluocinonide gel (LIDEX) 0.05 %  04/07/19   [provider]  furosemide (LASIX) 20 MG tablet TAKE 1 TABLET DAILY 10/31/20   Leone Haven, MD  Glucosamine HCl 1000 MG TABS Take 1 tablet by mouth.    [provider]  levothyroxine (SYNTHROID) 100 MCG tablet TAKE 1 TABLET DAILY 08/06/21   Leone Haven, MD  Magnesium 250 MG TABS Take 500 mg by mouth.     [provider]  Multiple Vitamins-Minerals (PRESERVISION AREDS PO) Take 1 tablet by mouth daily.     [provider]  Omega-3 Fatty Acids (  FISH OIL) 1000 MG CPDR Take 3,000 mg by mouth daily.     [provider]  pantoprazole (PROTONIX) 40 MG tablet TAKE 1 TABLET BY MOUTH EVERY DAY 02/18/22   Leone Haven, MD  POTASSIUM PO Take 600 mg by mouth.    [provider]  Probiotic Product (TRUBIOTICS PO) Take 1 capsule by mouth.    [provider]  traMADol (ULTRAM) 50 MG tablet Take 50 mg by mouth 2 (two) times daily as needed. 01/12/21   [provider]    Family History  Problem Relation Age of Onset   Hypothyroidism Mother    Hypertension Mother    Dementia Father    Dementia Paternal Grandmother      Social History   Tobacco Use   Smoking status: Never   Smokeless tobacco: Never  Vaping Use   Vaping Use: Never  used  Substance Use Topics   Alcohol use: No   Drug use: No    Allergies as of 05/15/2022 - Review Complete 05/15/2022  Allergen Reaction Noted   Moxifloxacin  11/06/2020   Augmentin [amoxicillin-pot clavulanate] Itching 06/30/2017   Doxycycline  11/06/2020    Review of Systems:    All systems reviewed and negative except where noted in HPI.   Physical Exam:  BP 134/78 (BP Location: Left Arm, Patient Position: Sitting, Cuff Size: Normal)   Pulse 64   Temp 98.1 F (36.7 C) (Oral)  No LMP recorded. Patient is postmenopausal. Psych:  Alert and cooperative. Normal mood and affect. General:   Alert,  Well-developed, well-nourished, pleasant and cooperative in NAD Head:  Normocephalic and atraumatic. Eyes:  Sclera clear, no icterus.   Conjunctiva pink. Ears:  Normal auditory acuity. Abdomen:  Normal bowel sounds.  No bruits.  Soft, non-tender and non-distended without masses, hepatosplenomegaly or hernias noted.  No guarding or rebound tenderness.    Neurologic:  Alert and oriented x3;  grossly normal neurologically. Psych:  Alert and cooperative. Normal mood and affect.  Imaging Studies: CT Abdomen Pelvis W Contrast  Result Date: 05/01/2022 CLINICAL DATA:  Epigastric pain EXAM: CT ABDOMEN AND PELVIS WITH CONTRAST TECHNIQUE: Multidetector CT imaging of the abdomen and pelvis was performed using the standard protocol following bolus administration of intravenous contrast. RADIATION DOSE REDUCTION: This exam was performed according to the departmental dose-optimization program which includes automated exposure control, adjustment of the mA and/or kV according to patient size and/or use of iterative reconstruction technique. CONTRAST:  15m OMNIPAQUE IOHEXOL 300 MG/ML  SOLN COMPARISON:  CT abdomen and pelvis 11/15/2014 FINDINGS: Lower chest: No acute abnormality. Hepatobiliary: Liver is normal in size and contour with no suspicious mass identified. Gallbladder is surgically absent. No  significant biliary ductal dilatation. Pancreas: Mildly atrophic with no mass or ductal dilatation identified. Spleen: Normal in size without focal abnormality. Adrenals/Urinary Tract: Adrenal glands are normal. No hydronephrosis identified. 11 mm cystic lesion in the posterior right kidney measures 26 Hounsfield unit density and has increased in size since previous study. A few additional subcentimeter hypodense likely cysts. Urinary bladder is normal. Stomach/Bowel: No bowel obstruction, free air or pneumatosis. Colonic diverticulosis. There is an approximately 7 cm segment of proximal transverse colon which demonstrates apparent wall thickening and luminal narrowing relative to the colon more proximally and distally, and persists on the delayed scan. Appendix is normal. Vascular/Lymphatic: No significant vascular findings are present. No enlarged abdominal or pelvic lymph nodes. Reproductive: Uterus and bilateral adnexa are unremarkable. Other: No ascites. Musculoskeletal: Degenerative changes of the  lumbar spine. No suspicious bony lesions identified. IMPRESSION: 1. Segment of proximal transverse colon appears to demonstrate wall thickening and luminal narrowing compared to the colon more proximally and distally. Differentials include peristalsis, infectious/inflammatory colitis, or possibly neoplasm. Correlate with any recent colonoscopy, or consider colonoscopy or short-term repeat CT again with oral contrast. 2. 11 mm complex cystic lesion in the posterior right kidney. Consider follow-up MRI abdomen with contrast. 3. Colonic diverticulosis. Electronically Signed   By: Ofilia Neas M.D.   On: 05/01/2022 16:17    Assessment and Plan:   Cathy Coffey is a 81 y.o. y/o female has been referred for abdominal pain.  Recent CT scan of the abdomen shows luminal narrowing of the transverse colon.  Appearance is concerning for a apple core lesion due to neoplasm.  In addition she has a history of abdominal  pain suggestive of a gastric ulcer likely due to long-term use of NSAIDs.  Some history suggestive of melena likely from the same reason.  She has noticed a change in her bowel habits with new onset constipation and bloating which all could be related to the luminal narrowing of the transverse colon  Plan 1.  EGD +colonoscopy within 1 week 2.  MiraLAX 17 g twice daily 3.  Stop all NSAID use 4.  Continue on pantoprazole 40 mg once a day first thing in the morning on an empty stomach 5.  H. pylori breath test   I have discussed alternative options, risks & benefits,  which include, but are not limited to, bleeding, infection, perforation,respiratory complication & drug reaction.  The patient agrees with this plan & written consent will be obtained.     Follow up in 8 to 12 weeks  Dr Jonathon Bellows MD,MRCP(U.K)

## 2022-05-15 NOTE — Addendum Note (Signed)
Addended by: Vanetta Mulders on: 05/15/2022 09:48 AM   Modules accepted: Orders

## 2022-05-15 NOTE — Telephone Encounter (Signed)
This patient was seen today, has a few questions about her colonoscopy. Requesting a call back.

## 2022-05-15 NOTE — Telephone Encounter (Signed)
Returned patients phone call in regards to colonoscopy questions.  LVM for her to call me back tomorrow morning.  Thanks, [place], [place]

## 2022-05-16 ENCOUNTER — Telehealth: Payer: Self-pay

## 2022-05-16 LAB — H. PYLORI BREATH TEST: H pylori Breath Test: NEGATIVE

## 2022-05-16 NOTE — Telephone Encounter (Signed)
Called patient back and answered all questions.

## 2022-05-20 ENCOUNTER — Ambulatory Visit
Admission: RE | Admit: 2022-05-20 | Discharge: 2022-05-20 | Disposition: A | Discharge status: Home / Self Care | Payer: Medicare HMO | Source: Ambulatory Visit | Attending: Family Medicine | Admitting: Family Medicine

## 2022-05-20 DIAGNOSIS — N281 Cyst of kidney, acquired: Secondary | ICD-10-CM | POA: Diagnosis not present

## 2022-05-20 DIAGNOSIS — M47816 Spondylosis without myelopathy or radiculopathy, lumbar region: Secondary | ICD-10-CM | POA: Diagnosis not present

## 2022-05-20 DIAGNOSIS — N2889 Other specified disorders of kidney and ureter: Secondary | ICD-10-CM | POA: Insufficient documentation

## 2022-05-20 DIAGNOSIS — Z9049 Acquired absence of other specified parts of digestive tract: Secondary | ICD-10-CM | POA: Diagnosis not present

## 2022-05-20 MED ORDER — GADOBUTROL 1 MMOL/ML IV SOLN
7.5000 mL | Freq: Once | INTRAVENOUS | Status: AC | PRN
  Administered 2022-05-20: 7.5 mL via INTRAVENOUS

## 2022-05-24 ENCOUNTER — Other Ambulatory Visit (INDEPENDENT_AMBULATORY_CARE_PROVIDER_SITE_OTHER): Payer: Medicare HMO

## 2022-05-24 ENCOUNTER — Telehealth: Payer: Self-pay | Admitting: Family Medicine

## 2022-05-24 ENCOUNTER — Telehealth: Payer: Self-pay

## 2022-05-24 DIAGNOSIS — R350 Frequency of micturition: Secondary | ICD-10-CM

## 2022-05-24 DIAGNOSIS — R7301 Impaired fasting glucose: Secondary | ICD-10-CM

## 2022-05-24 NOTE — Telephone Encounter (Signed)
I called and spoke with the patient and informed her that the provider for today stated she needed to come in today for labs and she is scheduled and she also needed a follow up in a week for a Bp check and she is scheduled with her PCP for Tuesday.  Cathy Coffey,cma

## 2022-05-24 NOTE — Telephone Encounter (Signed)
Dr. Vicente Males, patient called stating that she wants to cancel her colonoscopy but proceed with the EGD. Patient stated her potassium is low and she is afraid that she is dehydrated and doesn't want to take her prep. Patient stated that she had mentioned this to her PCP and they recommended to go to the ED but refused. Patient just wants to cancel her colonoscopy. Please advise.

## 2022-05-24 NOTE — Telephone Encounter (Signed)
Dr. Vicente Males agreed to for her to cancel her colonoscopy and proceed with her EGD. Patient and endoscopy unit was notified.

## 2022-05-24 NOTE — Telephone Encounter (Signed)
ok 

## 2022-05-24 NOTE — Telephone Encounter (Signed)
Pt called stating she is having issues with her BP medication. Pt states she can not drink enough and is getting dehydrated. Pt states she is urinating all the time and throughout the night. Pt would like to be called

## 2022-05-24 NOTE — Addendum Note (Signed)
Addended by: Leeanne Rio on: 05/24/2022 02:45 PM   Modules accepted: Orders

## 2022-05-24 NOTE — Telephone Encounter (Signed)
Called the patient and spoke with her and she stated she thinks its  the amlodipine causing her to pee so much, I informed her that the lasix is on her med list and she stated she is not taking lasix anymore but she is concerned about her potassium level being low because it happened before and her heart stopped, we have no openings here and I tried to convince her to go to the Er or the urgent care and she refused, she wants to stop the amlodipine or break it in half or take it every other day.  Please advise.

## 2022-05-25 LAB — BASIC METABOLIC PANEL
BUN/Creatinine Ratio: 17 (ref 12–28)
BUN: 16 mg/dL (ref 8–27)
CO2: 24 mmol/L (ref 20–29)
Calcium: 9.6 mg/dL (ref 8.7–10.3)
Chloride: 100 mmol/L (ref 96–106)
Creatinine, Ser: 0.94 mg/dL (ref 0.57–1.00)
Glucose: 102 mg/dL — ABNORMAL HIGH (ref 70–99)
Potassium: 3.9 mmol/L (ref 3.5–5.2)
Sodium: 139 mmol/L (ref 134–144)
eGFR: 62 mL/min/{1.73_m2} (ref >59)

## 2022-05-25 LAB — HEMOGLOBIN A1C
Est. average glucose Bld gHb Est-mCnc: 114 mg/dL
Hgb A1c MFr Bld: 5.6 % (ref 4.8–5.6)

## 2022-05-26 LAB — URINALYSIS, ROUTINE W REFLEX MICROSCOPIC
Bilirubin, UA: NEGATIVE
Glucose, UA: NEGATIVE
Ketones, UA: NEGATIVE
Leukocytes,UA: NEGATIVE
Nitrite, UA: NEGATIVE
Protein,UA: NEGATIVE
RBC, UA: NEGATIVE
Specific Gravity, UA: 1.005 (ref 1.005–1.030)
Urobilinogen, Ur: 0.2 mg/dL (ref 0.2–1.0)
pH, UA: 6.5 (ref 5.0–7.5)

## 2022-05-26 LAB — URINE CULTURE

## 2022-05-28 ENCOUNTER — Ambulatory Visit (INDEPENDENT_AMBULATORY_CARE_PROVIDER_SITE_OTHER): Payer: Medicare HMO | Admitting: Family Medicine

## 2022-05-28 ENCOUNTER — Encounter: Payer: Self-pay | Admitting: Family Medicine

## 2022-05-28 ENCOUNTER — Encounter: Payer: Self-pay | Admitting: Gastroenterology

## 2022-05-28 DIAGNOSIS — I10 Essential (primary) hypertension: Secondary | ICD-10-CM | POA: Diagnosis not present

## 2022-05-28 DIAGNOSIS — M810 Age-related osteoporosis without current pathological fracture: Secondary | ICD-10-CM | POA: Diagnosis not present

## 2022-05-28 DIAGNOSIS — R1013 Epigastric pain: Secondary | ICD-10-CM

## 2022-05-28 DIAGNOSIS — I5189 Other ill-defined heart diseases: Secondary | ICD-10-CM | POA: Diagnosis not present

## 2022-05-28 NOTE — Patient Instructions (Signed)
Nice to see you. Please call [phone] to schedule your bone density scan. 

## 2022-05-28 NOTE — Assessment & Plan Note (Signed)
Well-controlled off medication.  She will continue to monitor.  She will let us know if it trends up over 140/90.

## 2022-05-28 NOTE — Assessment & Plan Note (Signed)
She will call to schedule bone density scan.  She will continue vitamin D supplementation.

## 2022-05-28 NOTE — Assessment & Plan Note (Signed)
Grade 2 noted on prior echo.  She does not have any symptoms of heart failure.  Discussed keeping blood pressure under good control.  We will monitor.

## 2022-05-28 NOTE — Assessment & Plan Note (Addendum)
She will complete evaluation through GI.  Discussed MiraLAX is okay to use twice daily ongoing.

## 2022-05-28 NOTE — Progress Notes (Signed)
[name], MD Phone: [phone]  Cathy Coffey is a 81 y.o. female who presents today for f/u.  HYPERTENSION Disease Monitoring Home BP Monitoring 109-133/68-83 Chest pain- no    Dyspnea- no Medications Compliance-  not taking any medications. Stopped amlodipine a few days ago and generally feels better than she did. Edema- no BMET    Component Value Date/Time   NA 139 05/24/2022 1446   NA 141 11/15/2014 1314   K 3.9 05/24/2022 1446   K 3.9 11/15/2014 1314   CL 100 05/24/2022 1446   CL 105 11/15/2014 1314   CO2 24 05/24/2022 1446   CO2 29 11/15/2014 1314   GLUCOSE 102 (H) 05/24/2022 1446   GLUCOSE 121 (H) 04/24/2022 1641   GLUCOSE 105 (H) 11/15/2014 1314   BUN 16 05/24/2022 1446   BUN 17 11/15/2014 1314   CREATININE 0.94 05/24/2022 1446   CREATININE 0.94 11/15/2014 1314   CALCIUM 9.6 05/24/2022 1446   CALCIUM 8.8 11/15/2014 1314   GFRNONAA >60 10/09/2021 1152   GFRNONAA >60 11/15/2014 1314   GFRNONAA >60 02/03/2013 1943   GFRAA >60 07/16/2020 1454   GFRAA >60 11/15/2014 1314   GFRAA >60 02/03/2013 1943   Epigastric pain: Patient notes she has her EGD tomorrow.  She canceled her colonoscopy due to the fear of getting depleted with prep.  She has been doing liquid protein and taking in 1 cup of liquid at a time.  She has been doing MiraLAX twice daily and that is helped her bowel movements be more regular.  Osteoporosis: Patient is taking vitamin D.  She does not take much calcium supplementation.  She has not been on Fosamax in quite some time.  Patient notes she looked back through her chart and noted something abnormal with her heart and an elevated troponin in 2021.  Social History   Tobacco Use  Smoking Status Never  Smokeless Tobacco Never    Current Outpatient Medications on File Prior to Visit  Medication Sig Dispense Refill   B Complex-C (SUPER B COMPLEX PO) Take 1 capsule by mouth.     Calcium Carbonate-Vitamin D (CALCIUM-VITAMIN D) 500-200  MG-UNIT per tablet Take 3 tablets by mouth daily.      levothyroxine (SYNTHROID) 100 MCG tablet TAKE 1 TABLET DAILY 90 tablet 3   Magnesium 250 MG TABS Take 400 mg by mouth.     Multiple Vitamins-Minerals (PRESERVISION AREDS PO) Take 1 tablet by mouth daily.      Na Sulfate-K Sulfate-Mg Sulf 17.5-3.13-1.6 GM/177ML SOLN Take by mouth as directed.     Omega-3 Fatty Acids (FISH OIL) 1000 MG CPDR Take 3,000 mg by mouth daily.      POTASSIUM PO Take 600 mg by mouth.     Probiotic Product (TRUBIOTICS PO) Take 1 capsule by mouth.     No current facility-administered medications on file prior to visit.     ROS see history of present illness  Objective  Physical Exam Vitals:   05/28/22 1203  BP: 110/70  Pulse: 68  Temp: 98.3 F (36.8 C)  SpO2: 97%    BP Readings from Last 3 Encounters:  05/28/22 110/70  05/15/22 134/78  05/06/22 110/70   Wt Readings from Last 3 Encounters:  05/28/22 163 lb (73.9 kg)  05/15/22 165 lb 9.6 oz (75.1 kg)  05/06/22 165 lb 12.8 oz (75.2 kg)    Physical Exam Constitutional:      General: She is not in acute distress.    Appearance: She is  not diaphoretic.  Cardiovascular:     Rate and Rhythm: Normal rate and regular rhythm.     Heart sounds: Normal heart sounds.  Pulmonary:     Effort: Pulmonary effort is normal.  Abdominal:     General: Bowel sounds are normal. There is no distension.     Palpations: Abdomen is soft.     Tenderness: There is no abdominal tenderness.  Skin:    General: Skin is warm and dry.  Neurological:     Mental Status: She is alert.      Assessment/Plan: Please see individual problem list.  Problem List Items Addressed This Visit     Epigastric pain (Chronic)    She will complete evaluation through GI.  Discussed MiraLAX is okay to use twice daily ongoing.      Hypertension (Chronic)    Well-controlled off medication.  She will continue to monitor.  She will let us know if it trends up over 140/90.       Osteoporosis (Chronic)    She will call to schedule bone density scan.  She will continue vitamin D supplementation.      Relevant Orders   DG Bone Density   Diastolic dysfunction    Grade 2 noted on prior echo.  She does not have any symptoms of heart failure.  Discussed keeping blood pressure under good control.  We will monitor.      Return for As scheduled.   [name], MD [place]

## 2022-05-29 ENCOUNTER — Encounter
Admission: RE | Disposition: A | Discharge status: Home / Self Care | Payer: Self-pay | Source: Home / Self Care | Attending: Gastroenterology

## 2022-05-29 ENCOUNTER — Ambulatory Visit
Admission: RE | Admit: 2022-05-29 | Discharge: 2022-05-29 | Disposition: A | Discharge status: Home / Self Care | Payer: Medicare HMO | Attending: Gastroenterology | Admitting: Gastroenterology

## 2022-05-29 ENCOUNTER — Ambulatory Visit: Payer: Medicare HMO | Admitting: General Practice

## 2022-05-29 ENCOUNTER — Encounter: Payer: Self-pay | Admitting: Gastroenterology

## 2022-05-29 DIAGNOSIS — I10 Essential (primary) hypertension: Secondary | ICD-10-CM | POA: Diagnosis not present

## 2022-05-29 DIAGNOSIS — J45909 Unspecified asthma, uncomplicated: Secondary | ICD-10-CM | POA: Diagnosis not present

## 2022-05-29 DIAGNOSIS — R109 Unspecified abdominal pain: Secondary | ICD-10-CM | POA: Insufficient documentation

## 2022-05-29 DIAGNOSIS — G4730 Sleep apnea, unspecified: Secondary | ICD-10-CM | POA: Insufficient documentation

## 2022-05-29 DIAGNOSIS — Z6831 Body mass index (BMI) 31.0-31.9, adult: Secondary | ICD-10-CM | POA: Insufficient documentation

## 2022-05-29 DIAGNOSIS — K589 Irritable bowel syndrome without diarrhea: Secondary | ICD-10-CM | POA: Diagnosis not present

## 2022-05-29 DIAGNOSIS — R1013 Epigastric pain: Secondary | ICD-10-CM

## 2022-05-29 DIAGNOSIS — E039 Hypothyroidism, unspecified: Secondary | ICD-10-CM | POA: Insufficient documentation

## 2022-05-29 DIAGNOSIS — R933 Abnormal findings on diagnostic imaging of other parts of digestive tract: Secondary | ICD-10-CM | POA: Diagnosis not present

## 2022-05-29 DIAGNOSIS — K219 Gastro-esophageal reflux disease without esophagitis: Secondary | ICD-10-CM | POA: Insufficient documentation

## 2022-05-29 HISTORY — PX: ESOPHAGOGASTRODUODENOSCOPY: SHX5428

## 2022-05-29 SURGERY — EGD (ESOPHAGOGASTRODUODENOSCOPY)
Anesthesia: General

## 2022-05-29 MED ORDER — SODIUM CHLORIDE 0.9 % IV SOLN: INTRAVENOUS | Status: DC

## 2022-05-29 MED ORDER — PROPOFOL 500 MG/50ML IV EMUL
INTRAVENOUS | Status: DC | PRN
  Administered 2022-05-29: 50 ug/kg/min via INTRAVENOUS

## 2022-05-29 MED ORDER — LIDOCAINE HCL (CARDIAC) PF 100 MG/5ML IV SOSY
PREFILLED_SYRINGE | INTRAVENOUS | Status: DC | PRN
  Administered 2022-05-29: 60 mg via INTRAVENOUS

## 2022-05-29 MED ORDER — PROPOFOL 10 MG/ML IV BOLUS
INTRAVENOUS | Status: DC | PRN
  Administered 2022-05-29: 50 mg via INTRAVENOUS

## 2022-05-29 NOTE — H&P (Signed)
Jonathon Bellows, MD [address], [place], [place], [place], [address] [address], [place], [place], [place], [address] Phone: [phone]  Fax: [phone]  Primary Care Physician:  Leone Haven, MD   Pre-Procedure History & Physical: HPI:  HADESSAH GRENNAN is a 81 y.o. female is here for an endoscopy    Past Medical History:  Diagnosis Date   Asthma    COVID-19 virus infection 10/2020   Diverticulosis    Hypertension    Hypothyroidism    IBS (irritable bowel syndrome)    Lichen planus    Morbid obesity ([place])    Pancreatitis due to common bile duct stone 2000   Pneumonia due to COVID-19 virus 07/09/2020    Past Surgical History:  Procedure Laterality Date   CHOLECYSTECTOMY     COLONOSCOPY  2011   HERNIA REPAIR     MOUTH SURGERY  09/2016   TUBAL LIGATION      Prior to Admission medications   Medication Sig Start Date End Date Taking? Authorizing Provider  B Complex-C (SUPER B COMPLEX PO) Take 1 capsule by mouth.   Yes [provider]  Calcium Carbonate-Vitamin D (CALCIUM-VITAMIN D) 500-200 MG-UNIT per tablet Take 3 tablets by mouth daily.    Yes [provider]  levothyroxine (SYNTHROID) 100 MCG tablet TAKE 1 TABLET DAILY 08/06/21  Yes Leone Haven, MD  Magnesium 250 MG TABS Take 400 mg by mouth.   Yes [provider]  Multiple Vitamins-Minerals (PRESERVISION AREDS PO) Take 1 tablet by mouth daily.    Yes [provider]  Omega-3 Fatty Acids (FISH OIL) 1000 MG CPDR Take 3,000 mg by mouth daily.    Yes [provider]  POTASSIUM PO Take 600 mg by mouth.   Yes [provider]  Probiotic Product (TRUBIOTICS PO) Take 1 capsule by mouth.   Yes [provider]  Na Sulfate-K Sulfate-Mg Sulf 17.5-3.13-1.6 GM/177ML SOLN Take by mouth as directed. 05/23/22   [provider]    Allergies as of 05/15/2022 - Review Complete 05/15/2022  Allergen Reaction Noted   Moxifloxacin  11/06/2020    Augmentin [amoxicillin-pot clavulanate] Itching 06/30/2017   Doxycycline  11/06/2020    Family History  Problem Relation Age of Onset   Hypothyroidism Mother    Hypertension Mother    Dementia Father    Dementia Paternal Grandmother     Social History   Socioeconomic History   Marital status: Divorced    Spouse name: Not on file   Number of children: Not on file   Years of education: Not on file   Highest education level: Not on file  Occupational History   Not on file  Tobacco Use   Smoking status: Never   Smokeless tobacco: Never  Vaping Use   Vaping Use: Never used  Substance and Sexual Activity   Alcohol use: No   Drug use: No   Sexual activity: Never  Other Topics Concern   Not on file  Social History Narrative   Not on file   Social Determinants of Health   Financial Resource Strain: Low Risk  (11/07/2021)   Overall Financial Resource Strain (CARDIA)    Difficulty of Paying Living Expenses: Not hard at all  Food Insecurity: No Food Insecurity (11/07/2021)   Hunger Vital Sign    Worried About Running Out of Food in the Last Year: Never true    Ran Out of Food in the Last Year: Never true  Transportation  Needs: No Transportation Needs (11/07/2021)   PRAPARE - Hydrologist (Medical): No    Lack of Transportation (Non-Medical): No  Physical Activity: Not on file  Stress: No Stress Concern Present (11/07/2021)   [place]    Feeling of Stress : Not at all  Social Connections: Unknown (11/07/2021)   Social Connection and Isolation Panel [NHANES]    Frequency of Communication with Friends and Family: More than three times a week    Frequency of Social Gatherings with Friends and Family: More than three times a week    Attends Religious Services: Not on file    Active Member of [place] or Organizations: Not on file    Attends Archivist Meetings: Not on file     Marital Status: Divorced  Intimate Partner Violence: Not At Risk (11/07/2021)   Humiliation, Afraid, Rape, and Kick questionnaire    Fear of Current or Ex-Partner: No    Emotionally Abused: No    Physically Abused: No    Sexually Abused: No    Review of Systems: See HPI, otherwise negative ROS  Physical Exam: BP 134/88   Pulse (!) 56   Temp (!) 97.4 F (36.3 C) (Temporal)   Resp 18   Ht 5' (1.524 m)   Wt 72.6 kg   SpO2 99%   BMI 31.25 kg/m  General:   Alert,  pleasant and cooperative in NAD Head:  Normocephalic and atraumatic. Neck:  Supple; no masses or thyromegaly. Lungs:  Clear throughout to auscultation, normal respiratory effort.    Heart:  +S1, +S2, Regular rate and rhythm, No edema. Abdomen:  Soft, nontender and nondistended. Normal bowel sounds, without guarding, and without rebound.   Neurologic:  Alert and  oriented x4;  grossly normal neurologically.  Impression/Plan: SAAMIYA JEPPSEN is here for an endoscopy  to be performed for  evaluation of abdominal pain    Risks, benefits, limitations, and alternatives regarding endoscopy have been reviewed with the patient.  Questions have been answered.  All parties agreeable.   Jonathon Bellows, MD  05/29/2022, 12:49 PM

## 2022-05-29 NOTE — Anesthesia Preprocedure Evaluation (Signed)
Anesthesia Evaluation  Patient identified by MRN, date of birth, ID band Patient awake    Reviewed: Allergy & Precautions, NPO status , Patient's Chart, lab work & pertinent test results  History of Anesthesia Complications Negative for: history of anesthetic complications  Airway Mallampati: II  TM Distance: >3 FB Neck ROM: full    Dental  (+) Poor Dentition, Teeth Intact   Pulmonary sleep apnea ,    Pulmonary exam normal        Cardiovascular hypertension, negative cardio ROS Normal cardiovascular exam     Neuro/Psych PSYCHIATRIC DISORDERS negative neurological ROS     GI/Hepatic Neg liver ROS, GERD  ,  Endo/Other  negative endocrine ROS  Renal/GU negative Renal ROS  negative genitourinary   Musculoskeletal   Abdominal   Peds  Hematology negative hematology ROS (+)   Anesthesia Other Findings Past Medical History: No date: Asthma 10/2020: COVID-19 virus infection No date: Diverticulosis No date: Hypertension No date: Hypothyroidism No date: IBS (irritable bowel syndrome) No date: Lichen planus No date: Morbid obesity ([place]) 2000: Pancreatitis due to common bile duct stone 07/09/2020: Pneumonia due to COVID-19 virus  Past Surgical History: No date: CHOLECYSTECTOMY 2011: COLONOSCOPY No date: HERNIA REPAIR 09/2016: MOUTH SURGERY No date: TUBAL LIGATION  BMI    Body Mass Index: 31.25 kg/m      Reproductive/Obstetrics negative OB ROS                             Anesthesia Physical Anesthesia Plan  ASA: 2  Anesthesia Plan: General   Post-op Pain Management: Minimal or no pain anticipated   Induction: Intravenous  PONV Risk Score and Plan: 3 and Propofol infusion, TIVA and Ondansetron  Airway Management Planned: Nasal Cannula  Additional Equipment: None  Intra-op Plan:   Post-operative Plan:   Informed Consent: I have reviewed the patients History and Physical,  chart, labs and discussed the procedure including the risks, benefits and alternatives for the proposed anesthesia with the patient or authorized representative who has indicated his/her understanding and acceptance.     Dental advisory given  Plan Discussed with: CRNA and Surgeon  Anesthesia Plan Comments: (Discussed risks of anesthesia with patient, including possibility of difficulty with spontaneous ventilation under anesthesia necessitating airway intervention, PONV, and rare risks such as cardiac or respiratory or neurological events, and allergic reactions. Discussed the role of CRNA in patient's perioperative care. Patient understands.)        Anesthesia Quick Evaluation

## 2022-05-29 NOTE — Op Note (Signed)
[facility] Patient Name: Jayne Peckenpaugh Procedure Date: 05/29/2022 11:09 AM MRN: [id number] Account #: [id number] Date of Birth: [birth year] Admit Type: Outpatient Age: 81 Room: [facility] ENDO ROOM 3 Gender: Female Note Status: Finalized Instrument Name: Altamese Cabal Endoscope [id number] Procedure:             Upper GI endoscopy Indications:           Abdominal pain Providers:             Jonathon Bellows MD, MD Referring MD:          Angela Adam. Caryl Bis (Referring MD) Medicines:             Monitored Anesthesia Care Complications:         No immediate complications. Procedure:             Pre-Anesthesia Assessment:                        - Prior to the procedure, a History and Physical was                         performed, and patient medications, allergies and                         sensitivities were reviewed. The patient's tolerance                         of previous anesthesia was reviewed.                        - The risks and benefits of the procedure and the                         sedation options and risks were discussed with the                         patient. All questions were answered and informed                         consent was obtained.                        - ASA Grade Assessment: II - A patient with mild                         systemic disease.                        After obtaining informed consent, the endoscope was                         passed under direct vision. Throughout the procedure,                         the patient's blood pressure, pulse, and oxygen                         saturations were monitored continuously. The Endoscope                         was introduced  through the mouth, and advanced to the                         third part of duodenum. The upper GI endoscopy was                         accomplished with ease. The patient tolerated the                         procedure well. Findings:      The esophagus was  normal.      The stomach was normal.      The examined duodenum was normal. Impression:            - Normal esophagus.                        - Normal stomach.                        - Normal examined duodenum.                        - No specimens collected. Recommendation:        - Discharge patient to home (with escort).                        - Resume previous diet.                        - Continue present medications.                        - Return to my office PRN. Procedure Code(s):     --- Professional ---                        [phone], Esophagogastroduodenoscopy, flexible,                         transoral; diagnostic, including collection of                         specimen(s) by brushing or washing, when performed                         (separate procedure) Diagnosis Code(s):     --- Professional ---                        R10.9, Unspecified abdominal pain CPT copyright 2019 American Medical Association. All rights reserved. The codes documented in this report are preliminary and upon coder review may  be revised to meet current compliance requirements. Jonathon Bellows, MD Jonathon Bellows MD, MD 05/29/2022 11:26:01 AM This report has been signed electronically. Number of Addenda: 0 Note Initiated On: 05/29/2022 11:09 AM Estimated Blood Loss:  Estimated blood loss: none.      [facility]

## 2022-05-29 NOTE — Transfer of Care (Signed)
Immediate Anesthesia Transfer of Care Note  Patient: Cathy Coffey  Procedure(s) Performed: ESOPHAGOGASTRODUODENOSCOPY (EGD)  Patient Location: PACU  Anesthesia Type:General  Level of Consciousness: sedated  Airway & Oxygen Therapy: Patient Spontanous Breathing and Patient connected to nasal cannula oxygen  Post-op Assessment: Report given to RN and Post -op Vital signs reviewed and stable  Post vital signs: Reviewed and stable  Last Vitals:  Vitals Value Taken Time  BP 141/79 05/29/22 1130  Temp    Pulse 67 05/29/22 1130  Resp 21 05/29/22 1130  SpO2 99 % 05/29/22 1130  Vitals shown include unvalidated device data.  Last Pain:  Vitals:   05/29/22 1129  TempSrc:   PainSc: 0-No pain         Complications: No notable events documented.

## 2022-05-29 NOTE — Anesthesia Postprocedure Evaluation (Signed)
Anesthesia Post Note  Patient: Cathy Coffey  Procedure(s) Performed: ESOPHAGOGASTRODUODENOSCOPY (EGD)  Patient location during evaluation: Endoscopy Anesthesia Type: General Level of consciousness: awake and alert Pain management: pain level controlled Vital Signs Assessment: post-procedure vital signs reviewed and stable Respiratory status: spontaneous breathing, nonlabored ventilation, respiratory function stable and patient connected to nasal cannula oxygen Cardiovascular status: blood pressure returned to baseline and stable Postop Assessment: no apparent nausea or vomiting Anesthetic complications: no   No notable events documented.   Last Vitals:  Vitals:   05/29/22 1129 05/29/22 1140  BP:  134/88  Pulse:    Resp: 18   Temp:    SpO2: 99%     Last Pain:  Vitals:   05/29/22 1130  TempSrc:   PainSc: 0-No pain                 Dimas Millin

## 2022-05-30 ENCOUNTER — Encounter: Payer: Self-pay | Admitting: Gastroenterology

## 2022-06-03 ENCOUNTER — Telehealth: Payer: Self-pay

## 2022-06-03 ENCOUNTER — Other Ambulatory Visit: Payer: Self-pay

## 2022-06-03 ENCOUNTER — Telehealth: Payer: Self-pay | Admitting: Family Medicine

## 2022-06-03 DIAGNOSIS — R933 Abnormal findings on diagnostic imaging of other parts of digestive tract: Secondary | ICD-10-CM

## 2022-06-03 DIAGNOSIS — R1013 Epigastric pain: Secondary | ICD-10-CM

## 2022-06-03 MED ORDER — GOLYTELY 236 G PO SOLR: 4000.0000 mL | Freq: Once | ORAL | 0 refills | Status: AC

## 2022-06-03 NOTE — Telephone Encounter (Signed)
Pt called in stating that she is having trouble keeping up her potassium daily... Pt was wondering if there is any medication that can help her... Pt is requesting callback

## 2022-06-03 NOTE — Telephone Encounter (Signed)
Patient has been advised per Dr. Vicente Males that Golytely is the safest prep.

## 2022-06-03 NOTE — Telephone Encounter (Signed)
Can you find out what she means by trouble keeping her potassium up? Her potassium was fine on her last set of labs.

## 2022-06-03 NOTE — Telephone Encounter (Signed)
Pt contacted office to schedule her colonoscopy.  She said she was unable to get it done when she had her EGD because she didn't stop her medicine in time (No blood thinners listed on med list). Colonoscopy has been scheduled for 06/13/22 at [facility] using dx Abnormal CT Scan, and Epigastric Pain. She has requested the least expensive bowel prep.  I informed her the least expensive prep is Golytely bowel prep.  She has concerns about taking any bowel preps due to having had her potassium levels drop in the past- bringing her close to death.  I informed her that I would check with you to advise if Golytely would be okay for her to use.  Please advise.  Thanks, [place], [place]

## 2022-06-04 ENCOUNTER — Other Ambulatory Visit: Payer: Self-pay

## 2022-06-04 ENCOUNTER — Telehealth: Payer: Self-pay

## 2022-06-04 MED ORDER — NA SULFATE-K SULFATE-MG SULF 17.5-3.13-1.6 GM/177ML PO SOLN: 1.0000 | Freq: Once | ORAL | 0 refills | Status: AC

## 2022-06-04 NOTE — Telephone Encounter (Signed)
Pt has picked up her Golytely Bowel Prep and after picking it up she decided that she would like to go ahead and use the SuPrep Bowel Prep due to the amount of liquid that has to be taken.  She said she thinks she would prefer the SuPrep since it requires her to drink the least amount of prep.  Rx has been sent to pharmacy.  Thanks, [employer]

## 2022-06-13 ENCOUNTER — Ambulatory Visit
Admission: RE | Admit: 2022-06-13 | Discharge: 2022-06-13 | Disposition: A | Discharge status: Home / Self Care | Payer: Medicare HMO | Attending: Gastroenterology | Admitting: Gastroenterology

## 2022-06-13 ENCOUNTER — Ambulatory Visit: Payer: Medicare HMO | Admitting: Certified Registered"

## 2022-06-13 ENCOUNTER — Encounter
Admission: RE | Disposition: A | Discharge status: Home / Self Care | Payer: Self-pay | Source: Home / Self Care | Attending: Gastroenterology

## 2022-06-13 DIAGNOSIS — I10 Essential (primary) hypertension: Secondary | ICD-10-CM | POA: Insufficient documentation

## 2022-06-13 DIAGNOSIS — D122 Benign neoplasm of ascending colon: Secondary | ICD-10-CM | POA: Diagnosis not present

## 2022-06-13 DIAGNOSIS — G4730 Sleep apnea, unspecified: Secondary | ICD-10-CM | POA: Diagnosis not present

## 2022-06-13 DIAGNOSIS — K5730 Diverticulosis of large intestine without perforation or abscess without bleeding: Secondary | ICD-10-CM | POA: Diagnosis not present

## 2022-06-13 DIAGNOSIS — Q438 Other specified congenital malformations of intestine: Secondary | ICD-10-CM | POA: Diagnosis not present

## 2022-06-13 DIAGNOSIS — E039 Hypothyroidism, unspecified: Secondary | ICD-10-CM | POA: Diagnosis not present

## 2022-06-13 DIAGNOSIS — R933 Abnormal findings on diagnostic imaging of other parts of digestive tract: Secondary | ICD-10-CM | POA: Insufficient documentation

## 2022-06-13 DIAGNOSIS — R1013 Epigastric pain: Secondary | ICD-10-CM

## 2022-06-13 DIAGNOSIS — K635 Polyp of colon: Secondary | ICD-10-CM | POA: Diagnosis not present

## 2022-06-13 DIAGNOSIS — D126 Benign neoplasm of colon, unspecified: Secondary | ICD-10-CM | POA: Diagnosis not present

## 2022-06-13 HISTORY — PX: COLONOSCOPY WITH PROPOFOL: SHX5780

## 2022-06-13 SURGERY — COLONOSCOPY WITH PROPOFOL
Anesthesia: General

## 2022-06-13 MED ORDER — SODIUM CHLORIDE 0.9 % IV SOLN: INTRAVENOUS | Status: DC

## 2022-06-13 MED ORDER — LIDOCAINE 2% (20 MG/ML) 5 ML SYRINGE
INTRAMUSCULAR | Status: DC | PRN
  Administered 2022-06-13: 20 mg via INTRAVENOUS

## 2022-06-13 MED ORDER — PROPOFOL 10 MG/ML IV BOLUS
INTRAVENOUS | Status: DC | PRN
  Administered 2022-06-13: 70 mg via INTRAVENOUS

## 2022-06-13 MED ORDER — PROPOFOL 500 MG/50ML IV EMUL
INTRAVENOUS | Status: DC | PRN
  Administered 2022-06-13: 120 ug/kg/min via INTRAVENOUS

## 2022-06-13 MED ORDER — GLYCOPYRROLATE 0.2 MG/ML IJ SOLN
INTRAMUSCULAR | Status: DC | PRN
  Administered 2022-06-13: .2 mg via INTRAVENOUS

## 2022-06-13 NOTE — Anesthesia Postprocedure Evaluation (Signed)
Anesthesia Post Note  Patient: Cathy Coffey  Procedure(s) Performed: COLONOSCOPY WITH PROPOFOL  Patient location during evaluation: Endoscopy Anesthesia Type: General Level of consciousness: awake and alert Pain management: pain level controlled Vital Signs Assessment: post-procedure vital signs reviewed and stable Respiratory status: spontaneous breathing, nonlabored ventilation, respiratory function stable and patient connected to nasal cannula oxygen Cardiovascular status: blood pressure returned to baseline and stable Postop Assessment: no apparent nausea or vomiting Anesthetic complications: no   No notable events documented.   Last Vitals:  Vitals:   06/13/22 1113 06/13/22 1123  BP: 123/65 132/78  Pulse: 63   Resp: 12   Temp:    SpO2: 100%     Last Pain:  Vitals:   06/13/22 1113  TempSrc:   PainSc: 0-No pain                 Dimas Millin

## 2022-06-13 NOTE — H&P (Signed)
Jonathon Bellows, MD [address] [place], [place], [place], [address] [address], [place], [place], [place], [address] Phone: [phone]  Fax: [phone]  Primary Care Physician:  Leone Haven, MD   Pre-Procedure History & Physical: HPI:  Cathy Coffey is a 81 y.o. female is here for an colonoscopy.   Past Medical History:  Diagnosis Date   Asthma    COVID-19 virus infection 10/2020   Diverticulosis    Hypertension    Hypothyroidism    IBS (irritable bowel syndrome)    Lichen planus    Morbid obesity ([place])    Pancreatitis due to common bile duct stone 2000   Pneumonia due to COVID-19 virus 07/09/2020    Past Surgical History:  Procedure Laterality Date   CHOLECYSTECTOMY     COLONOSCOPY  2011   ESOPHAGOGASTRODUODENOSCOPY N/A 05/29/2022   Procedure: ESOPHAGOGASTRODUODENOSCOPY (EGD);  Surgeon: Jonathon Bellows, MD;  Location: [facility] ENDOSCOPY;  Service: Gastroenterology;  Laterality: N/A;   HERNIA REPAIR     MOUTH SURGERY  09/2016   TUBAL LIGATION      Prior to Admission medications   Medication Sig Start Date End Date Taking? Authorizing Provider  amLODipine (NORVASC) 5 MG tablet Take 5 mg by mouth daily.   Yes [provider]  levothyroxine (SYNTHROID) 100 MCG tablet TAKE 1 TABLET DAILY 08/06/21  Yes Leone Haven, MD  TRAMADOL HCL ER PO Take by mouth.   Yes [provider]  B Complex-C (SUPER B COMPLEX PO) Take 1 capsule by mouth.    [provider]  Calcium Carbonate-Vitamin D (CALCIUM-VITAMIN D) 500-200 MG-UNIT per tablet Take 3 tablets by mouth daily.     [provider]  Magnesium 250 MG TABS Take 400 mg by mouth.    [provider]  Multiple Vitamins-Minerals (PRESERVISION AREDS PO) Take 1 tablet by mouth daily.     [provider]  Na Sulfate-K Sulfate-Mg Sulf 17.5-3.13-1.6 GM/177ML SOLN Take by mouth as directed. 05/23/22   [provider]  Omega-3 Fatty Acids (FISH OIL) 1000 MG CPDR  Take 3,000 mg by mouth daily.     [provider]  POTASSIUM PO Take 600 mg by mouth.    [provider]  Probiotic Product (TRUBIOTICS PO) Take 1 capsule by mouth.    [provider]    Allergies as of 06/03/2022 - Review Complete 05/29/2022  Allergen Reaction Noted   Moxifloxacin  11/06/2020   Augmentin [amoxicillin-pot clavulanate] Itching 06/30/2017   Doxycycline  11/06/2020    Family History  Problem Relation Age of Onset   Hypothyroidism Mother    Hypertension Mother    Dementia Father    Dementia Paternal Grandmother     Social History   Socioeconomic History   Marital status: Divorced    Spouse name: Not on file   Number of children: Not on file   Years of education: Not on file   Highest education level: Not on file  Occupational History   Not on file  Tobacco Use   Smoking status: Never   Smokeless tobacco: Never  Vaping Use   Vaping Use: Never used  Substance and Sexual Activity   Alcohol use: No   Drug use: No   Sexual activity: Never  Other Topics Concern   Not on file  Social History Narrative   Not on file   Social Determinants of Health   Financial Resource Strain: Low Risk  (11/07/2021)   Overall Financial Resource  Strain (CARDIA)    Difficulty of Paying Living Expenses: Not hard at all  Food Insecurity: No Food Insecurity (11/07/2021)   Hunger Vital Sign    Worried About Running Out of Food in the Last Year: Never true    Ran Out of Food in the Last Year: Never true  Transportation Needs: No Transportation Needs (11/07/2021)   PRAPARE - Hydrologist (Medical): No    Lack of Transportation (Non-Medical): No  Physical Activity: Not on file  Stress: No Stress Concern Present (11/07/2021)   [place]    Feeling of Stress : Not at all  Social Connections: Unknown (11/07/2021)   Social Connection and Isolation Panel  [NHANES]    Frequency of Communication with Friends and Family: More than three times a week    Frequency of Social Gatherings with Friends and Family: More than three times a week    Attends Religious Services: Not on file    Active Member of [place] or Organizations: Not on file    Attends Archivist Meetings: Not on file    Marital Status: Divorced  Intimate Partner Violence: Not At Risk (11/07/2021)   Humiliation, Afraid, Rape, and Kick questionnaire    Fear of Current or Ex-Partner: No    Emotionally Abused: No    Physically Abused: No    Sexually Abused: No    Review of Systems: See HPI, otherwise negative ROS  Physical Exam: BP (!) 143/78   Pulse 65   Temp (!) 97.2 F (36.2 C) (Temporal)   Resp 18   SpO2 98%  General:   Alert,  pleasant and cooperative in NAD Head:  Normocephalic and atraumatic. Neck:  Supple; no masses or thyromegaly. Lungs:  Clear throughout to auscultation, normal respiratory effort.    Heart:  +S1, +S2, Regular rate and rhythm, No edema. Abdomen:  Soft, nontender and nondistended. Normal bowel sounds, without guarding, and without rebound.   Neurologic:  Alert and  oriented x4;  grossly normal neurologically.  Impression/Plan: Cathy Coffey is here for an colonoscopy to be performed for abnormal ct scan .   Risks, benefits, limitations, and alternatives regarding  colonoscopy have been reviewed with the patient.  Questions have been answered.  All parties agreeable.   Jonathon Bellows, MD  06/13/2022, 10:27 AM

## 2022-06-13 NOTE — Transfer of Care (Signed)
Immediate Anesthesia Transfer of Care Note  Patient: Cathy Coffey  Procedure(s) Performed: COLONOSCOPY WITH PROPOFOL  Patient Location: Endoscopy Unit  Anesthesia Type:General  Level of Consciousness: drowsy  Airway & Oxygen Therapy: Patient Spontanous Breathing  Post-op Assessment: Report given to RN and Post -op Vital signs reviewed and stable  Post vital signs: Reviewed  Last Vitals:  Vitals Value Taken Time  BP 91/67 06/13/22 1103  Temp 35.8 C 06/13/22 1103  Pulse 70 06/13/22 1103  Resp 21 06/13/22 1103  SpO2 98 % 06/13/22 1103    Last Pain:  Vitals:   06/13/22 1103  TempSrc: Temporal  PainSc:          Complications: No notable events documented.

## 2022-06-13 NOTE — Anesthesia Preprocedure Evaluation (Signed)
Anesthesia Evaluation  Patient identified by MRN, date of birth, ID band Patient awake    Reviewed: Allergy & Precautions, NPO status , Patient's Chart, lab work & pertinent test results  History of Anesthesia Complications Negative for: history of anesthetic complications  Airway Mallampati: II  TM Distance: >3 FB Neck ROM: full    Dental  (+) Poor Dentition, Teeth Intact   Pulmonary neg shortness of breath, sleep apnea ,    Pulmonary exam normal        Cardiovascular hypertension, (-) angina(-) CAD, (-) Past MI and (-) CABG negative cardio ROS Normal cardiovascular exam     Neuro/Psych PSYCHIATRIC DISORDERS negative neurological ROS     GI/Hepatic Neg liver ROS, GERD  ,  Endo/Other  Hypothyroidism   Renal/GU negative Renal ROS  negative genitourinary   Musculoskeletal   Abdominal   Peds  Hematology negative hematology ROS (+)   Anesthesia Other Findings Past Medical History: No date: Asthma 10/2020: COVID-19 virus infection No date: Diverticulosis No date: Hypertension No date: Hypothyroidism No date: IBS (irritable bowel syndrome) No date: Lichen planus No date: Morbid obesity ([place]) 2000: Pancreatitis due to common bile duct stone 07/09/2020: Pneumonia due to COVID-19 virus  Past Surgical History: No date: CHOLECYSTECTOMY 2011: COLONOSCOPY No date: HERNIA REPAIR 09/2016: MOUTH SURGERY No date: TUBAL LIGATION  BMI    Body Mass Index: 31.25 kg/m      Reproductive/Obstetrics negative OB ROS                             Anesthesia Physical  Anesthesia Plan  ASA: 2  Anesthesia Plan: General   Post-op Pain Management: Minimal or no pain anticipated   Induction: Intravenous  PONV Risk Score and Plan: 3 and Propofol infusion, TIVA and Ondansetron  Airway Management Planned: Nasal Cannula  Additional Equipment: None  Intra-op Plan:   Post-operative Plan:    Informed Consent: I have reviewed the patients History and Physical, chart, labs and discussed the procedure including the risks, benefits and alternatives for the proposed anesthesia with the patient or authorized representative who has indicated his/her understanding and acceptance.     Dental advisory given  Plan Discussed with: CRNA and Surgeon  Anesthesia Plan Comments: (Discussed risks of anesthesia with patient, including possibility of difficulty with spontaneous ventilation under anesthesia necessitating airway intervention, PONV, and rare risks such as cardiac or respiratory or neurological events, and allergic reactions. Discussed the role of CRNA in patient's perioperative care. Patient understands.)        Anesthesia Quick Evaluation

## 2022-06-13 NOTE — Op Note (Signed)
[facility] Gastroenterology Patient Name: Cathy Coffey Procedure Date: 06/13/2022 10:27 AM MRN: [id number] Account #: [id number] Date of Birth: April 19, 1941 Admit Type: Outpatient Age: 81 Room: [facility] ENDO ROOM 1 Gender: Female Note Status: Finalized Instrument Name: Jasper Riling [id number] Procedure:             Colonoscopy Indications:           Abnormal CT of the GI tract Providers:             Jonathon Bellows MD, MD Referring MD:          Angela Adam. Caryl Bis (Referring MD) Medicines:             Monitored Anesthesia Care Complications:         No immediate complications. Procedure:             Pre-Anesthesia Assessment:                        - Prior to the procedure, a History and Physical was                         performed, and patient medications, allergies and                         sensitivities were reviewed. The patient's tolerance                         of previous anesthesia was reviewed.                        - The risks and benefits of the procedure and the                         sedation options and risks were discussed with the                         patient. All questions were answered and informed                         consent was obtained.                        - ASA Grade Assessment: II - A patient with mild                         systemic disease.                        After obtaining informed consent, the colonoscope was                         passed under direct vision. Throughout the procedure,                         the patient's blood pressure, pulse, and oxygen                         saturations were monitored continuously. The                         Colonoscope was  introduced through the anus and                         advanced to the the cecum, identified by the                         appendiceal orifice. The colonoscopy was performed                         with ease. The colonoscopy was performed with moderate                          difficulty due to a tortuous colon. The patient                         tolerated the procedure well. The quality of the bowel                         preparation was good. Findings:      The perianal and digital rectal examinations were normal.      A 5 mm polyp was found in the ascending colon. The polyp was sessile.       The polyp was removed with a cold snare. Resection and retrieval were       complete.      Multiple small-mouthed diverticula were found in the sigmoid colon.      The exam was otherwise without abnormality on direct and retroflexion       views. Impression:            - One 5 mm polyp in the ascending colon, removed with                         a cold snare. Resected and retrieved.                        - Diverticulosis in the sigmoid colon.                        - The examination was otherwise normal on direct and                         retroflexion views. Recommendation:        - Discharge patient to home (with escort).                        - Resume previous diet.                        - Continue present medications.                        - Await pathology results.                        - Repeat colonoscopy is not recommended due to current                         age (29 years or older) for surveillance. Procedure Code(s):     --- Professional ---  45385, Colonoscopy, flexible; with removal of                         tumor(s), polyp(s), or other lesion(s) by snare                         technique Diagnosis Code(s):     --- Professional ---                        K63.5, Polyp of colon                        K57.30, Diverticulosis of large intestine without                         perforation or abscess without bleeding                        R93.3, Abnormal findings on diagnostic imaging of                         other parts of digestive tract CPT copyright 2019 American Medical Association. All rights reserved. The  codes documented in this report are preliminary and upon coder review may  be revised to meet current compliance requirements. Jonathon Bellows, MD Jonathon Bellows MD, MD 06/13/2022 11:02:00 AM This report has been signed electronically. Number of Addenda: 0 Note Initiated On: 06/13/2022 10:27 AM Scope Withdrawal Time: 0 hours 13 minutes 27 seconds  Total Procedure Duration: 0 hours 22 minutes 21 seconds  Estimated Blood Loss:  Estimated blood loss: none.      [facility]

## 2022-06-14 ENCOUNTER — Encounter: Payer: Self-pay | Admitting: Gastroenterology

## 2022-06-14 LAB — SURGICAL PATHOLOGY

## 2022-06-30 ENCOUNTER — Encounter: Payer: Self-pay | Admitting: Gastroenterology

## 2022-07-12 ENCOUNTER — Ambulatory Visit
Admission: RE | Admit: 2022-07-12 | Discharge: 2022-07-12 | Disposition: A | Discharge status: Home / Self Care | Payer: Medicare HMO | Source: Ambulatory Visit | Attending: Family Medicine | Admitting: Family Medicine

## 2022-07-12 DIAGNOSIS — M8588 Other specified disorders of bone density and structure, other site: Secondary | ICD-10-CM | POA: Diagnosis not present

## 2022-07-12 DIAGNOSIS — M810 Age-related osteoporosis without current pathological fracture: Secondary | ICD-10-CM

## 2022-07-12 DIAGNOSIS — Z780 Asymptomatic menopausal state: Secondary | ICD-10-CM | POA: Diagnosis not present

## 2022-07-15 ENCOUNTER — Encounter: Payer: Self-pay | Admitting: *Deleted

## 2022-07-15 ENCOUNTER — Other Ambulatory Visit: Payer: Self-pay | Admitting: Family

## 2022-07-15 DIAGNOSIS — M810 Age-related osteoporosis without current pathological fracture: Secondary | ICD-10-CM

## 2022-07-15 MED ORDER — ALENDRONATE SODIUM 70 MG PO TABS: 70.0000 mg | ORAL_TABLET | ORAL | 11 refills | Status: DC

## 2022-07-30 ENCOUNTER — Other Ambulatory Visit: Payer: Self-pay | Admitting: Family Medicine

## 2022-07-30 ENCOUNTER — Ambulatory Visit (INDEPENDENT_AMBULATORY_CARE_PROVIDER_SITE_OTHER): Payer: Medicare HMO | Admitting: Gastroenterology

## 2022-07-30 ENCOUNTER — Encounter: Payer: Self-pay | Admitting: Gastroenterology

## 2022-07-30 VITALS — BP 122/73 | HR 60 | Temp 97.5°F | Ht 60.0 in | Wt 167.0 lb

## 2022-07-30 DIAGNOSIS — R1013 Epigastric pain: Secondary | ICD-10-CM

## 2022-07-30 DIAGNOSIS — K5900 Constipation, unspecified: Secondary | ICD-10-CM

## 2022-07-30 NOTE — Progress Notes (Signed)
Jonathon Bellows MD, MRCP(U.K) [address]  [place]  [place], [place] [address]  Main: [phone]  Fax: [phone]   Primary Care Physician: Leone Haven, MD  Primary Gastroenterologist:  Dr. Jonathon Bellows   Chief Complaint  Patient presents with   Abdominal Pain    HPI: Cathy Coffey is a 81 y.o. female   Summary of history :  Initially referred and seen on 05/15/2022 for abdominal pain and abnormal ct scan.  She has been having epigastric pain for a few months.  Radiates to the right side at times.  She has had a prior cholecystectomy.  Pain is usually worse right after he eats and a few hours later has bloating.  She also has constipation which has been ongoing for many years but has recently got worse.   Has had a few episodes of black-colored stools recently.  She has been taking ibuprofen on a daily basis for many years for back pain.  On pantoprazole but unsure if she has been taking it regularly.  No unintentional weight loss.   She cut down on the ibuprofen and her abdominal pain has improved.   05/01/2022 ct scan  segment of proximal transverse colon appears to demonstrate wall thickening and luminal narrowing compared to colon more proximally and distally endoscopy evaluation required.  11 mm complex cyst of the kidney also noted.  04/24/2022 hemoglobin 13.1 g, creatinine 0.96    Interval history  05/15/2022-07/30/2022   05/29/2022:EGD: normal  06/13/2022: Colonoscopy : diverticulosis and a 5 mm polyp resected.  Since her last visit she has been taking the MiraLAX 2 times a day and has been doing well has regular bowel movements no significant abdominal discomfort.  She has been taking Tylenol in the morning and Advil on and off.  She has not been taking the pantoprazole after her last visit.  No other complaints presently.   Current Outpatient Medications  Medication Sig Dispense Refill   alendronate (FOSAMAX) 70 MG tablet Take 1 tablet (70 mg total) by  mouth every 7 (seven) days. Take with a full glass of water on an empty stomach. 4 tablet 11   amLODipine (NORVASC) 5 MG tablet Take 5 mg by mouth daily.     B Complex-C (SUPER B COMPLEX PO) Take 1 capsule by mouth.     Calcium Carbonate-Vitamin D (CALCIUM-VITAMIN D) 500-200 MG-UNIT per tablet Take 3 tablets by mouth daily.      levothyroxine (SYNTHROID) 100 MCG tablet TAKE 1 TABLET DAILY 90 tablet 3   Magnesium 250 MG TABS Take 400 mg by mouth.     Multiple Vitamins-Minerals (PRESERVISION AREDS PO) Take 1 tablet by mouth daily.      Omega-3 Fatty Acids (FISH OIL) 1000 MG CPDR Take 3,000 mg by mouth daily.      POTASSIUM PO Take 600 mg by mouth.     Probiotic Product (TRUBIOTICS PO) Take 1 capsule by mouth.     TRAMADOL HCL ER PO Take by mouth.     No current facility-administered medications for this visit.    Allergies as of 07/30/2022 - Review Complete 07/30/2022  Allergen Reaction Noted   Moxifloxacin  11/06/2020   Augmentin [amoxicillin-pot clavulanate] Itching 06/30/2017   Doxycycline  11/06/2020    ROS:  General: Negative for anorexia, weight loss, fever, chills, fatigue, weakness. ENT: Negative for hoarseness, difficulty swallowing , nasal congestion. CV: Negative for chest pain, angina, palpitations, dyspnea on exertion, peripheral edema.  Respiratory: Negative for dyspnea  at rest, dyspnea on exertion, cough, sputum, wheezing.  GI: See history of present illness. GU:  Negative for dysuria, hematuria, urinary incontinence, urinary frequency, nocturnal urination.  Endo: Negative for unusual weight change.    Physical Examination:   BP 122/73   Pulse 60   Temp (!) 97.5 F (36.4 C) (Oral)   Ht 5' (1.524 m)   Wt 167 lb (75.8 kg)   BMI 32.61 kg/m   General: Well-nourished, well-developed in no acute distress.  Eyes: No icterus. Conjunctivae pink. Mouth: Oropharyngeal mucosa moist and pink , no lesions erythema or exudate. Neuro: Alert and oriented x 3.  Grossly  intact. Skin: Warm and dry, no jaundice.   Psych: Alert and cooperative, normal mood and affect.   Imaging Studies: DG Bone Density  Result Date: 07/12/2022 EXAM: DUAL X-RAY ABSORPTIOMETRY (DXA) FOR BONE MINERAL DENSITY IMPRESSION: Your patient Cathy Coffey completed a BMD test on 07/12/2022 using the [employer] (software version: 14.10) manufactured by [employer]. The following summarizes the results of our evaluation. Technologist: SCE PATIENT BIOGRAPHICAL: Name: Cathy Coffey, Cathy Coffey Patient ID: [id number] Birth Date: [birth year] Height: 60.0 in. Gender: Female Exam Date: 07/12/2022 Weight: 164.4 lbs. Indications: Height Loss, Advanced Age, History of Osteoporosis, Hypothyroid, Postmenopausal, Caucasian Fractures: Treatments: calcium w/ vit D, Levothyroxine, Multi-Vitamin, Vitamin D DENSITOMETRY RESULTS: Site      Region        Measured Date Measured Age WHO Classification Young Adult T-score BMD         %Change vs. Previous Significant Change (*) AP Spine L1-L4 (L2,L3) 07/12/2024 79.0 Osteopenia -2.4 0.890 g/cm2 -0.6% - AP Spine L1-L4 (L2,L3) 07/20/2019 76.0 Osteopenia -2.3 0.895 g/cm2 -0.7% - AP Spine L1-L4 (L2,L3) 02/24/2017 73.6 Osteopenia -2.3 0.901 g/cm2 - - DualFemur Neck Right 07/12/2022 79.0 Osteoporosis -2.5 0.692 g/cm2 -2.5% - DualFemur Neck Right 07/20/2019 76.0 Osteopenia -2.4 0.710 g/cm2 0.7% - DualFemur Neck Right 02/24/2017 73.6 Osteopenia -2.4 0.705 g/cm2 - - DualFemur Total Mean 07/12/2022 79.0 Osteopenia -1.8 0.786 g/cm2 -2.2% - DualFemur Total Mean 07/20/2019 76.0 Osteopenia -1.6 0.804 g/cm2 1.8% - DualFemur Total Mean 02/24/2017 73.6 Osteopenia -1.7 0.790 g/cm2 - - ASSESSMENT: The BMD measured at Femur Neck Right is 0.692 g/cm2 with a T-score of -2.5. This patient is considered osteoporotic according to [facility]) criteria. The scan quality is good. Compared with prior study, there has been no significant change in the spine. Compared with prior  study, there has been no significant change in the total hip. L2 and L3 was excluded due to degenerative changes. [facility]) criteria for post-menopausal, Caucasian Women: Normal:                   T-score at or above -1 SD Osteopenia/low bone mass: T-score between -1 and -2.5 SD Osteoporosis:             T-score at or below -2.5 SD RECOMMENDATIONS: 1. All patients should optimize calcium and vitamin D intake. 2. Consider FDA-approved medical therapies in postmenopausal women and men aged 43 years and older, based on the following: a. A hip or vertebral(clinical or morphometric) fracture b. T-score < -2.5 at the femoral neck or spine after appropriate evaluation to exclude secondary causes c. Low bone mass (T-score between -1.0 and -2.5 at the femoral neck or spine) and a 10-year probability of a hip fracture > 3% or a 10-year probability of a major osteoporosis-related fracture > 20% based on the [place]-adapted WHO algorithm 3. Clinician  judgment and/or patient preferences may indicate treatment for people with 10-year fracture probabilities above or below these levels FOLLOW-UP: People with diagnosed cases of osteoporosis or at high risk for fracture should have regular bone mineral density tests. For patients eligible for Medicare, routine testing is allowed once every 2 years. The testing frequency can be increased to one year for patients who have rapidly progressing disease, those who are receiving or discontinuing medical therapy to restore bone mass, or have additional risk factors. I have reviewed this report, and agree with the above findings. [facility] Electronically Signed   By: Franki Cabot M.D.   On: 07/12/2022 13:26    Assessment and Plan:   Cathy Coffey is a 81 y.o. y/o female here to follow up for abdominal pain.  Recent CT scan of the abdomen shows luminal narrowing of the transverse colon.  Colonoscopy showed no such abnormality.  EGD was also normal.  Likely has had abdominal pain due to constipation    Plan 1.  MiraLAX 17 g twice daily continue as she is doing well on it.  Explained to her if the pain 2.  Need her to stop using NSAIDs unless absolutely needed if taking on a regular basis use that the smallest dose and needs to take a PPI such as pantoprazole on a daily basis to protect her gastric lining and to avoid ulcers.  She decided that she would avoid NSAIDs and use Tylenol as needed for her back pain.   Dr Jonathon Bellows  MD,MRCP [facility]) Follow up in as needed

## 2022-07-31 ENCOUNTER — Other Ambulatory Visit (INDEPENDENT_AMBULATORY_CARE_PROVIDER_SITE_OTHER): Payer: Medicare HMO

## 2022-07-31 DIAGNOSIS — E785 Hyperlipidemia, unspecified: Secondary | ICD-10-CM

## 2022-07-31 DIAGNOSIS — E039 Hypothyroidism, unspecified: Secondary | ICD-10-CM | POA: Diagnosis not present

## 2022-07-31 LAB — LIPID PANEL
Cholesterol: 198 mg/dL (ref 0–200)
HDL: 65.9 mg/dL (ref >39.00)
LDL Cholesterol: 104 mg/dL — ABNORMAL HIGH (ref 0–99)
NonHDL: 132.37
Total CHOL/HDL Ratio: 3
Triglycerides: 140 mg/dL (ref 0.0–149.0)
VLDL: 28 mg/dL (ref 0.0–40.0)

## 2022-07-31 LAB — TSH: TSH: 3.43 u[IU]/mL (ref 0.35–5.50)

## 2022-08-05 ENCOUNTER — Encounter: Payer: Self-pay | Admitting: Dermatology

## 2022-08-05 ENCOUNTER — Ambulatory Visit: Payer: Medicare HMO | Admitting: Dermatology

## 2022-08-05 ENCOUNTER — Encounter: Payer: Self-pay | Admitting: Family Medicine

## 2022-08-05 ENCOUNTER — Ambulatory Visit (INDEPENDENT_AMBULATORY_CARE_PROVIDER_SITE_OTHER): Payer: Medicare HMO | Admitting: Family Medicine

## 2022-08-05 VITALS — BP 130/74 | HR 56 | Temp 98.0°F | Ht 60.0 in | Wt 164.8 lb

## 2022-08-05 DIAGNOSIS — Z8679 Personal history of other diseases of the circulatory system: Secondary | ICD-10-CM | POA: Diagnosis not present

## 2022-08-05 DIAGNOSIS — Z23 Encounter for immunization: Secondary | ICD-10-CM

## 2022-08-05 DIAGNOSIS — M810 Age-related osteoporosis without current pathological fracture: Secondary | ICD-10-CM | POA: Diagnosis not present

## 2022-08-05 DIAGNOSIS — R1013 Epigastric pain: Secondary | ICD-10-CM | POA: Diagnosis not present

## 2022-08-05 DIAGNOSIS — L578 Other skin changes due to chronic exposure to nonionizing radiation: Secondary | ICD-10-CM

## 2022-08-05 DIAGNOSIS — D210 Benign neoplasm of connective and other soft tissue of head, face and neck: Secondary | ICD-10-CM

## 2022-08-05 DIAGNOSIS — D492 Neoplasm of unspecified behavior of bone, soft tissue, and skin: Secondary | ICD-10-CM

## 2022-08-05 LAB — VITAMIN D 25 HYDROXY (VIT D DEFICIENCY, FRACTURES): VITD: 53.71 ng/mL (ref 30.00–100.00)

## 2022-08-05 NOTE — Patient Instructions (Signed)
Nice to see you. We will check your vitamin D today.

## 2022-08-05 NOTE — Progress Notes (Unsigned)
   New Patient Visit  Subjective  Cathy Coffey is a 81 y.o. female who presents for the following: Nevus (Forehead. Dur: years. Tender at times. Growing. Would like removed). The patient has spots, moles and lesions to be evaluated, some may be new or changing and the patient has concerns that these could be cancer.  Review of Systems: No other skin or systemic complaints except as noted in HPI or Assessment and Plan.  Objective  Well appearing patient in no apparent distress; mood and affect are within normal limits.  A focused examination was performed including face. Relevant physical exam findings are noted in the Assessment and Plan.  Left Forehead above eyebrow 0.6 cm pink pearly papule      Assessment & Plan  Neoplasm of skin Left Forehead above eyebrow Epidermal / dermal shaving  Lesion diameter (cm):  0.6 Informed consent: discussed and consent obtained   Timeout: patient name, date of birth, surgical site, and procedure verified   Procedure prep:  Patient was prepped and draped in usual sterile fashion Prep type:  Isopropyl alcohol Anesthesia: the lesion was anesthetized in a standard fashion   Anesthetic:  1% lidocaine w/ epinephrine 1-100,000 buffered w/ 8.4% NaHCO3 Instrument used: flexible razor blade   Hemostasis achieved with: pressure, aluminum chloride and electrodesiccation   Outcome: patient tolerated procedure well   Post-procedure details: sterile dressing applied and wound care instructions given   Dressing type: bandage and petrolatum    Destruction of lesion Complexity: extensive   Destruction method: electrodesiccation and curettage   Informed consent: discussed and consent obtained   Timeout:  patient name, date of birth, surgical site, and procedure verified Procedure prep:  Patient was prepped and draped in usual sterile fashion Prep type:  Isopropyl alcohol Anesthesia: the lesion was anesthetized in a standard fashion   Anesthetic:  1%  lidocaine w/ epinephrine 1-100,000 buffered w/ 8.4% NaHCO3 Curettage performed in three different directions: Yes   Electrodesiccation performed over the curetted area: Yes   Curettage cycles:  3 Lesion length (cm):  0.6 Lesion width (cm):  0.6 Margin per side (cm):  0.2 Final wound size (cm):  1 Hemostasis achieved with:  pressure and aluminum chloride Outcome: patient tolerated procedure well with no complications   Post-procedure details: sterile dressing applied and wound care instructions given   Dressing type: bandage and petrolatum    Specimen 1 - Surgical pathology Differential Diagnosis: BCC vs Nevus Check Margins: No  Actinic Damage - chronic, secondary to cumulative UV radiation exposure/sun exposure over time - diffuse scaly erythematous macules with underlying dyspigmentation - Recommend daily broad spectrum sunscreen SPF 30+ to sun-exposed areas, reapply every 2 hours as needed.  - Recommend staying in the shade or wearing long sleeves, sun glasses (UVA+UVB protection) and wide brim hats (4-inch brim around the entire circumference of the hat). - Call for new or changing lesions.  Return in about 6 months (around 02/03/2023) for TBSE.  I, Emelia Salisbury, CMA, am acting as scribe for Sarina Ser, MD. Documentation: I have reviewed the above documentation for accuracy and completeness, and I agree with the above.  Sarina Ser, MD

## 2022-08-05 NOTE — Assessment & Plan Note (Signed)
Resolved.  Likely related to constipation.  She will continue MiraLAX twice daily.

## 2022-08-05 NOTE — Assessment & Plan Note (Signed)
Blood pressure is well controlled with no medication.  She will continue to periodically monitor this.

## 2022-08-05 NOTE — Patient Instructions (Signed)
Recommend daily broad spectrum sunscreen SPF 30+ to sun-exposed areas, reapply every 2 hours as needed. Call for new or changing lesions.  Staying in the shade or wearing long sleeves, sun glasses (UVA+UVB protection) and wide brim hats (4-inch brim around the entire circumference of the hat) are also recommended for sun protection.    Wound Care Instructions  Cleanse wound gently with soap and water once a day then pat dry with clean gauze. Apply a thin coat of Petrolatum (petroleum jelly, "Vaseline") over the wound (unless you have an allergy to this). We recommend that you use a new, sterile tube of Vaseline. Do not pick or remove scabs. Do not remove the yellow or white "healing tissue" from the base of the wound.  Cover the wound with fresh, clean, nonstick gauze and secure with paper tape. You may use Band-Aids in place of gauze and tape if the wound is small enough, but would recommend trimming much of the tape off as there is often too much. Sometimes Band-Aids can irritate the skin.  You should call the office for your biopsy report after 1 week if you have not already been contacted.  If you experience any problems, such as abnormal amounts of bleeding, swelling, significant bruising, significant pain, or evidence of infection, please call the office immediately.  FOR ADULT SURGERY PATIENTS: If you need something for pain relief you may take 1 extra strength Tylenol (acetaminophen) AND 2 Ibuprofen ('200mg'$  each) together every 4 hours as needed for pain. (do not take these if you are allergic to them or if you have a reason you should not take them.) Typically, you may only need pain medication for 1 to 3 days.     Due to recent changes in healthcare laws, you may see results of your pathology and/or laboratory studies on MyChart before the doctors have had a chance to review them. We understand that in some cases there may be results that are confusing or concerning to you. Please  understand that not all results are received at the same time and often the doctors may need to interpret multiple results in order to provide you with the best plan of care or course of treatment. Therefore, we ask that you please give [name] 2 business days to thoroughly review all your results before contacting the office for clarification. Should we see a critical lab result, you will be contacted sooner.   If You Need Anything After Your Visit  If you have any questions or concerns for your doctor, please call our main line at [phone] and press option 4 to reach your doctor's medical assistant. If no one answers, please leave a voicemail as directed and we will return your call as soon as possible. Messages left after 4 pm will be answered the following business day.   You may also send [name] a message via [facility]. We typically respond to MyChart messages within 1-2 business days.  For prescription refills, please ask your pharmacy to contact our office. Our fax number is [phone].  If you have an urgent issue when the clinic is closed that cannot wait until the next business day, you can page your doctor at the number below.    Please note that while we do our best to be available for urgent issues outside of office hours, we are not available 24/7.   If you have an urgent issue and are unable to reach [name], you may choose to seek medical care at your doctor's  your doctor's office, retail clinic, urgent care center, or emergency room.  If you have a medical emergency, please immediately call 911 or go to the emergency department.  Pager Numbers  - Dr. Kowalski: [phone]  - Dr. Moye: [phone]  - Dr. Stewart: [phone]  In the event of inclement weather, please call our main line at [phone] for an update on the status of any delays or closures.  Dermatology Medication Tips: Please keep the boxes that topical medications come in in order to help keep track of the instructions about  where and how to use these. Pharmacies typically print the medication instructions only on the boxes and not directly on the medication tubes.   If your medication is too expensive, please contact our office at [phone] option 4 or send us a message through MyChart.   We are unable to tell what your co-pay for medications will be in advance as this is different depending on your insurance coverage. However, we may be able to find a substitute medication at lower cost or fill out paperwork to get insurance to cover a needed medication.   If a prior authorization is required to get your medication covered by your insurance company, please allow us 1-2 business days to complete this process.  Drug prices often vary depending on where the prescription is filled and some pharmacies may offer cheaper prices.  The website www.goodrx.com contains coupons for medications through different pharmacies. The prices here do not account for what the cost may be with help from insurance (it may be cheaper with your insurance), but the website can give you the price if you did not use any insurance.  - You can print the associated coupon and take it with your prescription to the pharmacy.  - You may also stop by our office during regular business hours and pick up a GoodRx coupon card.  - If you need your prescription sent electronically to a different pharmacy, notify our office through [facility] MyChart or by phone at [phone] option 4.     Si Usted Necesita Algo Despus de Su Visita  Tambin puede enviarnos un mensaje a travs de MyChart. Por lo general respondemos a los mensajes de MyChart en el transcurso de 1 a 2 das hbiles.  Para renovar recetas, por favor pida a su farmacia que se ponga en contacto con nuestra oficina. Nuestro nmero de fax es el [phone].  Si tiene un asunto urgente cuando la clnica est cerrada y que no puede esperar hasta el siguiente da hbil, puede  llamar/localizar a su doctor(a) al nmero que aparece a continuacin.   Por favor, tenga en cuenta que aunque hacemos todo lo posible para estar disponibles para asuntos urgentes fuera del horario de oficina, no estamos disponibles las 24 horas del da, los 7 das de la semana.   Si tiene un problema urgente y no puede comunicarse con nosotros, puede optar por buscar atencin mdica  en el consultorio de su doctor(a), en una clnica privada, en un centro de atencin urgente o en una sala de emergencias.  Si tiene una emergencia mdica, por favor llame inmediatamente al 911 o vaya a la sala de emergencias.  Nmeros de bper  - Dr. Kowalski: [phone]  - Dra. Moye: [phone]  - Dra. Stewart: [phone]  En caso de inclemencias del tiempo, por favor llame a nuestra lnea principal al [phone] para una actualizacin sobre el estado de cualquier retraso o cierre.  Consejos para la medicacin en dermatologa:   Por favor, guarde las cajas en las que vienen los medicamentos de uso tpico para ayudarle a seguir las instrucciones sobre dnde y cmo usarlos. Las farmacias generalmente imprimen las instrucciones del medicamento slo en las cajas y no directamente en los tubos del medicamento.   Si su medicamento es muy caro, por favor, pngase en contacto con nuestra oficina llamando al [phone] y presione la opcin 4 o envenos un mensaje a travs de MyChart.   No podemos decirle cul ser su copago por los medicamentos por adelantado ya que esto es diferente dependiendo de la cobertura de su seguro. Sin embargo, es posible que podamos encontrar un medicamento sustituto a menor costo o llenar un formulario para que el seguro cubra el medicamento que se considera necesario.   Si se requiere una autorizacin previa para que su compaa de seguros cubra su medicamento, por favor permtanos de 1 a 2 das hbiles para completar este proceso.  Los precios de los medicamentos varan con  frecuencia dependiendo del lugar de dnde se surte la receta y alguna farmacias pueden ofrecer precios ms baratos.  El sitio web www.goodrx.com tiene cupones para medicamentos de diferentes farmacias. Los precios aqu no tienen en cuenta lo que podra costar con la ayuda del seguro (puede ser ms barato con su seguro), pero el sitio web puede darle el precio si no utiliz ningn seguro.  - Puede imprimir el cupn correspondiente y llevarlo con su receta a la farmacia.  - Tambin puede pasar por nuestra oficina durante el horario de atencin regular y recoger una tarjeta de cupones de GoodRx.  - Si necesita que su receta se enve electrnicamente a una farmacia diferente, informe a nuestra oficina a travs de MyChart de [facility] o por telfono llamando al [phone] y presione la opcin 4.  

## 2022-08-05 NOTE — Progress Notes (Signed)
[name], MD Phone: [phone]  Cathy Coffey is a 81 y.o. female who presents today for f/u.  History of HYPERTENSION Disease Monitoring Home BP Monitoring 116/70 Chest pain- no    Dyspnea- no Medications Compliance-  no medications.  BMET    Component Value Date/Time   NA 139 05/24/2022 1446   NA 141 11/15/2014 1314   K 3.9 05/24/2022 1446   K 3.9 11/15/2014 1314   CL 100 05/24/2022 1446   CL 105 11/15/2014 1314   CO2 24 05/24/2022 1446   CO2 29 11/15/2014 1314   GLUCOSE 102 (H) 05/24/2022 1446   GLUCOSE 121 (H) 04/24/2022 1641   GLUCOSE 105 (H) 11/15/2014 1314   BUN 16 05/24/2022 1446   BUN 17 11/15/2014 1314   CREATININE 0.94 05/24/2022 1446   CREATININE 0.94 11/15/2014 1314   CALCIUM 9.6 05/24/2022 1446   CALCIUM 8.8 11/15/2014 1314   GFRNONAA >60 10/09/2021 1152   GFRNONAA >60 11/15/2014 1314   GFRNONAA >60 02/03/2013 1943   GFRAA >60 07/16/2020 1454   GFRAA >60 11/15/2014 1314   GFRAA >60 02/03/2013 1943   Osteoporosis: Patient notes she does not want to take the Fosamax.  She is not sure that it is worth the potential benefit.  She notes it is difficult to work this medication into her other medication regimen.  She started back on calcium 1200 mg daily.  She is been taking vitamin D 6000 international units daily.  No fractures. Patient does not get much dietary calcium.   Epigastric pain: Patient notes this has improved significantly with taking MiraLAX twice daily.  She had an EGD and colonoscopy which did not reveal any causes for her discomfort.  Social History   Tobacco Use  Smoking Status Never  Smokeless Tobacco Never    Current Outpatient Medications on File Prior to Visit  Medication Sig Dispense Refill   B Complex-C (SUPER B COMPLEX PO) Take 1 capsule by mouth.     Calcium Carbonate-Vitamin D (CALCIUM-VITAMIN D) 500-200 MG-UNIT per tablet Take 3 tablets by mouth daily.      levothyroxine (SYNTHROID) 100 MCG tablet TAKE 1 TABLET DAILY  90 tablet 3   Magnesium 250 MG TABS Take 400 mg by mouth.     Multiple Vitamins-Minerals (PRESERVISION AREDS PO) Take 1 tablet by mouth daily.      Omega-3 Fatty Acids (FISH OIL) 1000 MG CPDR Take 3,000 mg by mouth daily.      POTASSIUM PO Take 600 mg by mouth.     Probiotic Product (TRUBIOTICS PO) Take 1 capsule by mouth.     TRAMADOL HCL ER PO Take by mouth.     No current facility-administered medications on file prior to visit.     ROS see history of present illness  Objective  Physical Exam Vitals:   08/05/22 1134  BP: 130/74  Pulse: (!) 56  Temp: 98 F (36.7 C)  SpO2: 99%    BP Readings from Last 3 Encounters:  08/05/22 130/74  07/30/22 122/73  06/13/22 132/78   Wt Readings from Last 3 Encounters:  08/05/22 164 lb 12.8 oz (74.8 kg)  07/30/22 167 lb (75.8 kg)  05/29/22 160 lb (72.6 kg)    Physical Exam Constitutional:      General: She is not in acute distress.    Appearance: She is not diaphoretic.  Cardiovascular:     Rate and Rhythm: Normal rate and regular rhythm.     Heart sounds: Normal heart sounds.  Pulmonary:  Effort: Pulmonary effort is normal.     Breath sounds: Normal breath sounds.  Abdominal:     General: Bowel sounds are normal. There is no distension.     Palpations: Abdomen is soft.     Tenderness: There is no abdominal tenderness.  Skin:    General: Skin is warm and dry.  Neurological:     Mental Status: She is alert.      Assessment/Plan: Please see individual problem list.  Problem List Items Addressed This Visit     Epigastric pain - Primary (Chronic)    Resolved.  Likely related to constipation.  She will continue MiraLAX twice daily.      History of hypertension (Chronic)    Blood pressure is well controlled with no medication.  She will continue to periodically monitor this.      Osteoporosis (Chronic)    Patient declines prescription treatment for this at this time.  We will check a vitamin D.  She will continue  vitamin D 6000 international units daily and calcium 1200 mg daily.      Relevant Orders   Vitamin D (25 hydroxy)   Other Visit Diagnoses     Need for immunization against influenza       Relevant Orders   Flu Vaccine QUAD High Dose(Fluad) (Completed)        Return in about 6 months (around 02/03/2023).   [name], MD [facility]

## 2022-08-05 NOTE — Assessment & Plan Note (Signed)
Patient declines prescription treatment for this at this time.  We will check a vitamin D.  She will continue vitamin D 6000 international units daily and calcium 1200 mg daily.

## 2022-08-06 ENCOUNTER — Encounter: Payer: Self-pay | Admitting: Dermatology

## 2022-08-07 ENCOUNTER — Telehealth: Payer: Self-pay

## 2022-08-07 NOTE — Telephone Encounter (Signed)
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

## 2022-08-07 NOTE — Telephone Encounter (Signed)
Advised pt of bx results/sh ?

## 2022-08-27 ENCOUNTER — Other Ambulatory Visit: Payer: Self-pay | Admitting: Family Medicine

## 2022-08-27 DIAGNOSIS — M5416 Radiculopathy, lumbar region: Secondary | ICD-10-CM

## 2022-08-31 ENCOUNTER — Ambulatory Visit
Admission: EM | Admit: 2022-08-31 | Discharge: 2022-08-31 | Disposition: A | Discharge status: Home / Self Care | Payer: Medicare HMO | Attending: Emergency Medicine | Admitting: Emergency Medicine

## 2022-08-31 DIAGNOSIS — Z6831 Body mass index (BMI) 31.0-31.9, adult: Secondary | ICD-10-CM | POA: Diagnosis not present

## 2022-08-31 DIAGNOSIS — I10 Essential (primary) hypertension: Secondary | ICD-10-CM | POA: Diagnosis not present

## 2022-08-31 DIAGNOSIS — J45909 Unspecified asthma, uncomplicated: Secondary | ICD-10-CM | POA: Insufficient documentation

## 2022-08-31 DIAGNOSIS — R051 Acute cough: Secondary | ICD-10-CM | POA: Insufficient documentation

## 2022-08-31 DIAGNOSIS — J209 Acute bronchitis, unspecified: Secondary | ICD-10-CM | POA: Diagnosis not present

## 2022-08-31 DIAGNOSIS — R059 Cough, unspecified: Secondary | ICD-10-CM | POA: Insufficient documentation

## 2022-08-31 DIAGNOSIS — Z1152 Encounter for screening for COVID-19: Secondary | ICD-10-CM | POA: Insufficient documentation

## 2022-08-31 LAB — RESP PANEL BY RT-PCR (FLU A&B, COVID) ARPGX2
Influenza A by PCR: NEGATIVE
Influenza B by PCR: NEGATIVE
SARS Coronavirus 2 by RT PCR: NEGATIVE

## 2022-08-31 MED ORDER — AZITHROMYCIN 250 MG PO TABS: 250.0000 mg | ORAL_TABLET | Freq: Every day | ORAL | 0 refills | Status: DC

## 2022-08-31 NOTE — ED Triage Notes (Addendum)
Patient to Urgent Care with complaints of cough and nasal congestion. Reports history of bronchitis. Symptoms started on Wednesday. Denies any known fevers.   Reports she has been drinking lemon/ honey and this has been helping with the cough.

## 2022-08-31 NOTE — ED Provider Notes (Signed)
[name]    CSN: [id number] Arrival date & time: 08/31/22  1018      History   Chief Complaint Chief Complaint  Patient presents with   Cough   Nasal Congestion    HPI Cathy Coffey is a 81 y.o. female.  Accompanied by her granddaughter, patient presents with 3-4 day history of congestion, postnasal drip, sore throat, cough productive of green sputum.  Treatment at home with DayQuil.  No fever, rash, chest pain, shortness of breath, vomiting, diarrhea, or other symptoms.  Her medical history includes asthma, hypertension, morbid obesity.  The history is provided by the patient, a relative and medical records.    Past Medical History:  Diagnosis Date   Asthma    COVID-19 virus infection 10/2020   Diverticulosis    Hypertension    Hypothyroidism    IBS (irritable bowel syndrome)    Lichen planus    Morbid obesity ([place])    Pancreatitis due to common bile duct stone 2000   Pneumonia due to COVID-19 virus 07/09/2020    Patient Active Problem List   Diagnosis Date Noted   Cough 76/73/4193   Diastolic dysfunction 79/12/4095   Solitary cyst of kidney 05/06/2022   Abnormal CT scan, colon 05/06/2022   Hyperlipidemia 01/31/2021   Prediabetes 01/31/2021   Obesity (BMI 30.0-34.9)    Asthma    Depression 10/26/2018   Left bundle branch block 02/18/2018   GERD (gastroesophageal reflux disease) 11/13/2017   Osteoporosis 09/02/2017   Chronic knee pain 01/29/2017   OSA (obstructive sleep apnea) 01/29/2017   Atrophic vaginitis 05/13/2016   Lumbago 02/27/2015   Epigastric pain 11/21/2014   NSAID long-term use 11/21/2014   Hot flashes 04/22/2014   IBS (irritable bowel syndrome) 07/27/2013   History of hypertension 07/18/2011   Hypothyroidism 07/18/2011    Past Surgical History:  Procedure Laterality Date   CHOLECYSTECTOMY     COLONOSCOPY  2011   COLONOSCOPY WITH PROPOFOL N/A 06/13/2022   Procedure: COLONOSCOPY WITH PROPOFOL;  Surgeon: Jonathon Bellows, MD;   Location: [facility] ENDOSCOPY;  Service: Gastroenterology;  Laterality: N/A;   ESOPHAGOGASTRODUODENOSCOPY N/A 05/29/2022   Procedure: ESOPHAGOGASTRODUODENOSCOPY (EGD);  Surgeon: Jonathon Bellows, MD;  Location: [facility] ENDOSCOPY;  Service: Gastroenterology;  Laterality: N/A;   HERNIA REPAIR     MOUTH SURGERY  09/2016   TUBAL LIGATION      OB History   No obstetric history on file.      Home Medications    Prior to Admission medications   Medication Sig Start Date End Date Taking? Authorizing Provider  azithromycin (ZITHROMAX) 250 MG tablet Take 1 tablet (250 mg total) by mouth daily. Take first 2 tablets together, then 1 every day until finished. 08/31/22  Yes Sharion Balloon, NP  B Complex-C (SUPER B COMPLEX PO) Take 1 capsule by mouth.    [provider]  Calcium Carbonate-Vitamin D (CALCIUM-VITAMIN D) 500-200 MG-UNIT per tablet Take 3 tablets by mouth daily.     [provider]  levothyroxine (SYNTHROID) 100 MCG tablet TAKE 1 TABLET DAILY 07/30/22   Leone Haven, MD  Magnesium 250 MG TABS Take 400 mg by mouth.    [provider]  Multiple Vitamins-Minerals (PRESERVISION AREDS PO) Take 1 tablet by mouth daily.     [provider]  Omega-3 Fatty Acids (FISH OIL) 1000 MG CPDR Take 3,000 mg by mouth daily.     [provider]  POTASSIUM PO Take 600 mg by mouth.    [provider]  Probiotic Product (TRUBIOTICS PO) Take 1 capsule by mouth.    [provider]  TRAMADOL HCL ER PO Take by mouth.    [provider]    Family History Family History  Problem Relation Age of Onset   Hypothyroidism Mother    Hypertension Mother    Dementia Father    Dementia Paternal Grandmother     Social History Social History   Tobacco Use   Smoking status: Never   Smokeless tobacco: Never  Vaping Use   Vaping Use: Never used  Substance Use Topics   Alcohol use: No   Drug use: No     Allergies   Moxifloxacin, Augmentin  [amoxicillin-pot clavulanate], and Doxycycline   Review of Systems Review of Systems  Constitutional:  Negative for chills and fever.  HENT:  Positive for congestion, ear pain, postnasal drip, rhinorrhea, sinus pressure and sore throat.   Respiratory:  Positive for cough. Negative for shortness of breath.   Cardiovascular:  Negative for chest pain and palpitations.  Gastrointestinal:  Negative for diarrhea and vomiting.  Skin:  Negative for color change and rash.  All other systems reviewed and are negative.    Physical Exam Triage Vital Signs ED Triage Vitals  Enc Vitals Group     BP      Pulse      Resp      Temp      Temp src      SpO2      Weight      Height      Head Circumference      Peak Flow      Pain Score      Pain Loc      Pain Edu?      Excl. in [place]?    No data found.  Updated Vital Signs BP (!) 145/87   Pulse 81   Temp 98.6 F (37 C)   Resp 19   Ht 5' (1.524 m)   Wt 160 lb (72.6 kg)   SpO2 98%   BMI 31.25 kg/m   Visual Acuity Right Eye Distance:   Left Eye Distance:   Bilateral Distance:    Right Eye Near:   Left Eye Near:    Bilateral Near:     Physical Exam Vitals and nursing note reviewed.  Constitutional:      General: She is not in acute distress.    Appearance: Normal appearance. She is well-developed. She is not ill-appearing.  HENT:     Right Ear: Tympanic membrane normal.     Left Ear: Tympanic membrane normal.     Nose: Congestion and rhinorrhea present.     Mouth/Throat:     Mouth: Mucous membranes are moist.     Pharynx: Posterior oropharyngeal erythema present.  Cardiovascular:     Rate and Rhythm: Normal rate and regular rhythm.     Heart sounds: Normal heart sounds.  Pulmonary:     Effort: Pulmonary effort is normal. No respiratory distress.     Breath sounds: Normal breath sounds.  Musculoskeletal:     Cervical back: Neck supple.  Skin:    General: Skin is warm and dry.  Neurological:     Mental Status: She  is alert.  Psychiatric:        Mood and Affect: Mood normal.        Behavior: Behavior normal.      UC Treatments / Results  Labs (all labs ordered are listed,  but only abnormal results are displayed) Labs Reviewed  RESP PANEL BY RT-PCR (FLU A&B, COVID) ARPGX2  RESP PANEL BY RT-PCR (RSV, FLU A&B, COVID)  RVPGX2    EKG   Radiology No results found.  Procedures Procedures (including critical care time)  Medications Ordered in UC Medications - No data to display  Initial Impression / Assessment and Plan / UC Course  I have reviewed the triage vital signs and the nursing notes.  Pertinent labs & imaging results that were available during my care of the patient were reviewed by me and considered in my medical decision making (see chart for details).    Cough, acute bronchitis.  COVID, Flu, RSV pending.  Treating with Zithromax.  Discussed symptomatic treatment including Tylenol, rest, hydration.  Cautioned patient to avoid OTC medications that can elevate her blood pressure.  Instructed patient to follow up with her PCP if symptoms are not improving.  She agrees to plan of care.   Final Clinical Impressions(s) / UC Diagnoses   Final diagnoses:  Acute cough  Acute bronchitis, unspecified organism     Discharge Instructions      Take the Zithromax as directed.    Your COVID, Flu, and RSV tests are pending.  Take Tylenol as needed for fever or discomfort.  Rest and keep yourself hydrated.    Avoid over-the-counter medications that can elevated your blood pressure  Follow-up with your primary care provider if your symptoms are not improving.         ED Prescriptions     Medication Sig Dispense Auth. Provider   azithromycin (ZITHROMAX) 250 MG tablet Take 1 tablet (250 mg total) by mouth daily. Take first 2 tablets together, then 1 every day until finished. 6 tablet Sharion Balloon, NP      PDMP not reviewed this encounter.   Sharion Balloon, NP 08/31/22  1104

## 2022-08-31 NOTE — Discharge Instructions (Addendum)
Take the Zithromax as directed.    Your COVID, Flu, and RSV tests are pending.  Take Tylenol as needed for fever or discomfort.  Rest and keep yourself hydrated.    Avoid over-the-counter medications that can elevated your blood pressure  Follow-up with your primary care provider if your symptoms are not improving.

## 2022-09-06 ENCOUNTER — Ambulatory Visit (INDEPENDENT_AMBULATORY_CARE_PROVIDER_SITE_OTHER): Payer: Medicare HMO | Admitting: Family Medicine

## 2022-09-06 ENCOUNTER — Encounter: Payer: Self-pay | Admitting: Family Medicine

## 2022-09-06 ENCOUNTER — Ambulatory Visit
Admission: RE | Admit: 2022-09-06 | Discharge: 2022-09-06 | Disposition: A | Discharge status: Home / Self Care | Payer: Medicare HMO | Source: Ambulatory Visit | Attending: Family Medicine | Admitting: Family Medicine

## 2022-09-06 DIAGNOSIS — M5416 Radiculopathy, lumbar region: Secondary | ICD-10-CM

## 2022-09-06 DIAGNOSIS — L989 Disorder of the skin and subcutaneous tissue, unspecified: Secondary | ICD-10-CM | POA: Diagnosis not present

## 2022-09-06 DIAGNOSIS — J40 Bronchitis, not specified as acute or chronic: Secondary | ICD-10-CM

## 2022-09-06 DIAGNOSIS — M47816 Spondylosis without myelopathy or radiculopathy, lumbar region: Secondary | ICD-10-CM | POA: Diagnosis not present

## 2022-09-06 DIAGNOSIS — M48061 Spinal stenosis, lumbar region without neurogenic claudication: Secondary | ICD-10-CM | POA: Diagnosis not present

## 2022-09-09 ENCOUNTER — Telehealth: Payer: Self-pay | Admitting: Family Medicine

## 2022-09-09 ENCOUNTER — Ambulatory Visit: Payer: Medicare HMO | Admitting: Gastroenterology

## 2022-09-09 DIAGNOSIS — L989 Disorder of the skin and subcutaneous tissue, unspecified: Secondary | ICD-10-CM | POA: Insufficient documentation

## 2022-09-09 DIAGNOSIS — J40 Bronchitis, not specified as acute or chronic: Secondary | ICD-10-CM | POA: Insufficient documentation

## 2022-09-09 NOTE — Assessment & Plan Note (Signed)
Discussed the area of concern appeared to be a scar to me.  It looks like it could be a scar from a skin tear.  The patient was concerned about a parasite I advised I would do some research to see if we needed to evaluate for this.

## 2022-09-09 NOTE — Progress Notes (Signed)
[name], MD Phone: [phone]  Cathy Coffey is a 81 y.o. female who presents today for follow-up.  Bronchitis: Patient was evaluated at urgent care for this.  She had a bad sore throat and congestion.  It subsequently moved into her chest.  She was treated with azithromycin.  She also used honey and vinegar for her cough.  She notes at this point her symptoms have improved significantly.  She notes no chest pain or shortness of breath.  Skin abnormality: Patient notes there is a spot on her right dorsal forearm that was not there previously.  She notes she did some reading and thinks she has a parasite.  She notes she feels chronically fatigued and thinks that could be related to the parasite.  Social History   Tobacco Use  Smoking Status Never  Smokeless Tobacco Never    Current Outpatient Medications on File Prior to Visit  Medication Sig Dispense Refill   azithromycin (ZITHROMAX) 250 MG tablet Take 1 tablet (250 mg total) by mouth daily. Take first 2 tablets together, then 1 every day until finished. 6 tablet 0   B Complex-C (SUPER B COMPLEX PO) Take 1 capsule by mouth.     Calcium Carbonate-Vitamin D (CALCIUM-VITAMIN D) 500-200 MG-UNIT per tablet Take 3 tablets by mouth daily.      levothyroxine (SYNTHROID) 100 MCG tablet TAKE 1 TABLET DAILY 90 tablet 3   Magnesium 250 MG TABS Take 400 mg by mouth.     Multiple Vitamins-Minerals (PRESERVISION AREDS PO) Take 1 tablet by mouth daily.      Omega-3 Fatty Acids (FISH OIL) 1000 MG CPDR Take 3,000 mg by mouth daily.      POTASSIUM PO Take 600 mg by mouth.     Probiotic Product (TRUBIOTICS PO) Take 1 capsule by mouth.     TRAMADOL HCL ER PO Take by mouth.     pantoprazole (PROTONIX) 40 MG tablet Take 40 mg by mouth daily.     No current facility-administered medications on file prior to visit.     ROS see history of present illness  Objective  Physical Exam Vitals:   09/06/22 1628  BP: 128/78  Pulse: 77  Temp:  98.4 F (36.9 C)  SpO2: 96%    BP Readings from Last 3 Encounters:  09/06/22 128/78  08/31/22 (!) 145/87  08/05/22 130/74   Wt Readings from Last 3 Encounters:  09/06/22 165 lb 3.2 oz (74.9 kg)  08/31/22 160 lb (72.6 kg)  08/05/22 164 lb 12.8 oz (74.8 kg)    Physical Exam Constitutional:      General: She is not in acute distress.    Appearance: She is not diaphoretic.  Cardiovascular:     Rate and Rhythm: Normal rate and regular rhythm.     Heart sounds: Normal heart sounds.  Pulmonary:     Effort: Pulmonary effort is normal.     Breath sounds: Normal breath sounds.  Skin:    General: Skin is warm and dry.       Neurological:     Mental Status: She is alert.      Assessment/Plan: Please see individual problem list.  Problem List Items Addressed This Visit     Bronchitis    Improved.  She will monitor for any worsening symptoms.      Skin abnormality    Discussed the area of concern appeared to be a scar to me.  It looks like it could be a scar from a skin tear.  The patient was concerned about a parasite I advised I would do some research to see if we needed to evaluate for this.      Return in about 6 months (around 03/08/2023).   [name], MD [place]

## 2022-09-09 NOTE — Telephone Encounter (Signed)
Please let the patient know that I looked into the potential of her having a parasitic infection.  I did not find any rashes in my research that were consistent with the lesion she had on her arm.  Has she had issues with crampy abdominal discomfort, diarrhea, itchy anus, nausea, or other symptoms. I am not sure that her insurance would cover the stool testing that would be required to figure out if she has a parasite if she does not have symptoms indicative of a parasitic infection.

## 2022-09-09 NOTE — Assessment & Plan Note (Signed)
Improved.  She will monitor for any worsening symptoms.

## 2022-09-09 NOTE — Telephone Encounter (Signed)
I called the patient and informed her of the message the provider stated about checking to see if she has a parasite infection and the patient stated she had no abdomen pain, nausea,itchy anus, diarrhea and no other symptoms, she stated she was just curious and she would think about the parasite testing and let you know and she appreciate the research that was done.  Nian,cma

## 2022-09-09 NOTE — Telephone Encounter (Signed)
Noted  

## 2022-09-20 DIAGNOSIS — H5203 Hypermetropia, bilateral: Secondary | ICD-10-CM | POA: Diagnosis not present

## 2022-09-20 DIAGNOSIS — H52223 Regular astigmatism, bilateral: Secondary | ICD-10-CM | POA: Diagnosis not present

## 2022-09-20 DIAGNOSIS — H524 Presbyopia: Secondary | ICD-10-CM | POA: Diagnosis not present

## 2022-09-20 DIAGNOSIS — H35373 Puckering of macula, bilateral: Secondary | ICD-10-CM | POA: Diagnosis not present

## 2022-09-20 DIAGNOSIS — H35363 Drusen (degenerative) of macula, bilateral: Secondary | ICD-10-CM | POA: Diagnosis not present

## 2022-09-20 DIAGNOSIS — H40023 Open angle with borderline findings, high risk, bilateral: Secondary | ICD-10-CM | POA: Diagnosis not present

## 2022-09-24 DIAGNOSIS — M4316 Spondylolisthesis, lumbar region: Secondary | ICD-10-CM | POA: Diagnosis not present

## 2022-09-24 DIAGNOSIS — M4317 Spondylolisthesis, lumbosacral region: Secondary | ICD-10-CM | POA: Diagnosis not present

## 2022-09-25 DIAGNOSIS — Z0100 Encounter for examination of eyes and vision without abnormal findings: Secondary | ICD-10-CM | POA: Diagnosis not present

## 2022-10-11 ENCOUNTER — Other Ambulatory Visit: Payer: Self-pay | Admitting: Family Medicine

## 2022-10-11 DIAGNOSIS — I10 Essential (primary) hypertension: Secondary | ICD-10-CM

## 2022-10-11 NOTE — Telephone Encounter (Signed)
She is no longer on this. Did she request this or did the pharmacy request this?

## 2022-10-23 ENCOUNTER — Telehealth: Payer: Self-pay | Admitting: Family Medicine

## 2022-10-23 NOTE — Telephone Encounter (Addendum)
Pt called stating she is low in potassium and would like someone to call her. Pt stated last time her potassium was low her heart stopped. Sent to access nurse

## 2022-10-29 ENCOUNTER — Telehealth: Payer: Self-pay | Admitting: Family Medicine

## 2022-10-29 NOTE — Telephone Encounter (Signed)
Spoke with patient she stated she WCB to sched AWV she was driving

## 2022-11-14 DIAGNOSIS — R110 Nausea: Secondary | ICD-10-CM | POA: Diagnosis not present

## 2022-11-14 DIAGNOSIS — U071 COVID-19: Secondary | ICD-10-CM | POA: Diagnosis not present

## 2022-11-14 DIAGNOSIS — Z03818 Encounter for observation for suspected exposure to other biological agents ruled out: Secondary | ICD-10-CM | POA: Diagnosis not present

## 2022-11-15 ENCOUNTER — Ambulatory Visit (INDEPENDENT_AMBULATORY_CARE_PROVIDER_SITE_OTHER): Payer: Medicare HMO

## 2022-11-15 VITALS — Ht 60.0 in | Wt 165.0 lb

## 2022-11-15 DIAGNOSIS — Z0000 Encounter for general adult medical examination without abnormal findings: Secondary | ICD-10-CM

## 2022-11-15 NOTE — Progress Notes (Signed)
Subjective:   Cathy Coffey is a 82 y.o. female who presents for Medicare Annual (Subsequent) preventive examination.  Review of Systems    No ROS.  Medicare Wellness Virtual Visit.  Visual/audio telehealth visit, UTA vital signs.   See social history for additional risk factors.   Cardiac Risk Factors include: advanced age (>27mn, >>13women)     Objective:    Today's Vitals   11/15/22 0923  Weight: 165 lb (74.8 kg)  Height: 5' (1.524 m)   Body mass index is 32.22 kg/m.     11/15/2022    9:37 AM 08/31/2022   10:29 AM 06/13/2022   10:17 AM 05/29/2022   10:25 AM 11/07/2021   12:49 PM 10/09/2021   11:52 AM 11/06/2020   12:42 PM  Advanced Directives  Does Patient Have a Medical Advance Directive? Yes No No No No No No  Type of Advance Directive HEva       Does patient want to make changes to medical advance directive? No - Patient declined        Copy of [place]? No - copy requested        Would patient like information on creating a medical advance directive?     No - Patient declined No - Patient declined No - Patient declined    Current Medications (verified) Outpatient Encounter Medications as of 11/15/2022  Medication Sig   azithromycin (ZITHROMAX) 250 MG tablet Take 1 tablet (250 mg total) by mouth daily. Take first 2 tablets together, then 1 every day until finished.   B Complex-C (SUPER B COMPLEX PO) Take 1 capsule by mouth.   Calcium Carbonate-Vitamin D (CALCIUM-VITAMIN D) 500-200 MG-UNIT per tablet Take 3 tablets by mouth daily.    levothyroxine (SYNTHROID) 100 MCG tablet TAKE 1 TABLET DAILY   Magnesium 250 MG TABS Take 400 mg by mouth.   Multiple Vitamins-Minerals (PRESERVISION AREDS PO) Take 1 tablet by mouth daily.    Omega-3 Fatty Acids (FISH OIL) 1000 MG CPDR Take 3,000 mg by mouth daily.    pantoprazole (PROTONIX) 40 MG tablet Take 40 mg by mouth daily.   POTASSIUM PO Take 600 mg by mouth.   Probiotic  Product (TRUBIOTICS PO) Take 1 capsule by mouth.   TRAMADOL HCL ER PO Take by mouth.   No facility-administered encounter medications on file as of 11/15/2022.    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  Service: Gastroenterology;  Laterality: N/A;   ESOPHAGOGASTRODUODENOSCOPY N/A 05/29/2022   Procedure: ESOPHAGOGASTRODUODENOSCOPY (EGD);  Surgeon: AJonathon Bellows MD;  [facility];  Service: Gastroenterology;  Laterality: N/A;   HERNIA REPAIR     MOUTH SURGERY  09/2016   TUBAL LIGATION     Family History  Problem Relation Age of Onset   Hypothyroidism Mother    Hypertension  Mother    Dementia Father    Dementia Paternal Grandmother    Social History   Socioeconomic History   Marital status: Divorced    Spouse name: Not on file   Number of children: Not on file   Years of education: Not on file   Highest education level: Not on file  Occupational History   Not on file  Tobacco Use   Smoking status: Never   Smokeless tobacco: Never  Vaping Use   Vaping Use: Never used  Substance and Sexual Activity   Alcohol use: No   Drug use: No   Sexual activity: Never  Other Topics Concern   Not on file  Social History Narrative   Not on file   Social Determinants of Health   Financial Resource Strain: Low Risk  (11/15/2022)   Overall Financial Resource Strain (CARDIA)     Difficulty of Paying Living Expenses: Not hard at all  Food Insecurity: No Food Insecurity (11/15/2022)   Hunger Vital Sign    Worried About Running Out of Food in the Last Year: Never true    [place] in the Last Year: Never true  Transportation Needs: No Transportation Needs (11/15/2022)   PRAPARE - Hydrologist (Medical): No    Lack of Transportation (Non-Medical): No  Physical Activity: Not on file  Stress: No Stress Concern Present (11/15/2022)   [place]    Feeling of Stress : Not at all  Social Connections: Unknown (11/15/2022)   Social Connection and Isolation Panel [NHANES]    Frequency of Communication with Friends and Family: More than three times a week    Frequency of Social Gatherings with Friends and Family: More than three times a week    Attends Religious Services: More than 4 times per year    Active Member of [employer] or Organizations: Not on file    Attends Archivist Meetings: Not on file    Marital Status: Divorced    Tobacco Counseling Counseling given: Not Answered   Clinical Intake:  Pre-visit preparation completed: Yes        Diabetes: No  How often do you need to have someone help you when you read instructions, pamphlets, or other written materials from your doctor or pharmacy?: 1 - Never    Interpreter Needed?: No      Activities of Daily Living    11/15/2022    9:39 AM  In your present state of health, do you have any difficulty performing the following activities:  Hearing? 0  Vision? 0  Difficulty concentrating or making decisions? 0  Walking or climbing stairs? 0  Dressing or bathing? 0  Doing errands, shopping? 0  Preparing Food and eating ? N  Using the Toilet? N  In the past six months, have you accidently leaked urine? Y  Comment Managed with daily brief/pad  Do you have problems with loss of bowel control? N  Managing  your Medications? N  Managing your Finances? N  Housekeeping or managing your Housekeeping? N    Patient Care Team: Leone Haven, MD as PCP - General (Family Medicine)  Indicate any recent Medical Services you may have received from other than [facility] providers in the past year (date may be approximate).     Assessment:   This is a routine wellness examination for Cathy Coffey.  I connected with  Deborah Chalk on 11/15/22 by a audio  enabled telemedicine application and verified that I am speaking with the correct person using two identifiers.  Patient Location: Home  Provider Location: Office/Clinic  I discussed the limitations of evaluation and management by telemedicine. The patient expressed understanding and agreed to proceed.   Hearing/Vision screen Hearing Screening - Comments:: Patient is able to hear conversational tones without difficulty. No issues reported. Vision Screening - Comments:: Followed by Dr. Matilde Sprang  Wears corrective lenses Cataract extraction, bilateral    Dietary issues and exercise activities discussed: Current Exercise Habits: Home exercise routine, Type of exercise: treadmill, Time (Minutes): 30, Intensity: Mild Regular diet    Goals Addressed             This Visit's Progress    Increase physical activity   On track    Use the treadmill for exercise       Depression Screen    11/15/2022    9:27 AM 08/05/2022   11:36 AM 04/24/2022    4:32 PM 02/01/2022    9:21 AM 11/07/2021   12:47 PM 08/03/2021    9:29 AM 11/06/2020   12:41 PM  PHQ 2/9 Scores  PHQ - 2 Score 0 0 0 0 0 0 0    Fall Risk    11/15/2022    9:38 AM 04/24/2022    4:32 PM 02/01/2022    9:20 AM 11/07/2021   12:53 PM 08/03/2021    9:28 AM  [place] in the past year? 0 0 0 0 0  Number falls in past yr: 0 0 0 0 0  Injury with Fall? 0 0 0    Risk for fall due to :  No Fall Risks No Fall Risks    Follow up Falls evaluation completed Falls evaluation completed Falls  evaluation completed Falls evaluation completed Falls evaluation completed    [place]: Home free of loose throw rugs in walkways, pet beds, electrical cords, etc? Yes  Adequate lighting in your home to reduce risk of falls? Yes   ASSISTIVE DEVICES UTILIZED TO PREVENT FALLS: Life alert? No  Use of a cane, walker or w/c? No   TIMED UP AND GO: Was the test performed? No .   Cognitive Function: Patient is alert and oriented x3.  Manages her own medications and finances.  100% independent.      10/19/2018   10:23 AM 10/16/2016   10:56 AM  MMSE - Mini Mental State Exam  Orientation to time 5 5  Orientation to Place 5 5  Registration 3 3  Attention/ Calculation 4 5  Recall 3 3  Language- name 2 objects 2 2  Language- repeat 1 1  Language- follow 3 step command 3 3  Language- read & follow direction 1 1  Write a sentence 1 1  Copy design 1 1  Total score 29 30        11/15/2022    9:58 AM 10/16/2017   10:36 AM  6CIT Screen  What Year? 0 points 0 points  What month? 0 points 0 points  What time? 0 points 0 points  Count back from 20  0 points  Months in reverse  0 points  Repeat phrase  0 points  Total Score  0 points    Immunizations Immunization History  Administered Date(s) Administered   Fluad Quad(high Dose 65+) 07/21/2019, 08/09/2020, 08/03/2021, 08/05/2022   Influenza Split 10/01/2012   Influenza, High Dose Seasonal PF 08/21/2016, 09/02/2017, 10/19/2018  Influenza,inj,Quad PF,6+ Mos 07/27/2013, 11/02/2014, 11/09/2015   Pneumococcal Conjugate-13 11/02/2014   Pneumococcal Polysaccharide-23 10/01/2012   TDAP status: Due, Education has been provided regarding the importance of this vaccine. Advised may receive this vaccine at local pharmacy or Health Dept. Aware to provide a copy of the vaccination record if obtained from local pharmacy or Health Dept. Verbalized acceptance and understanding.   Shingrix Completed?: No.     Education has been provided regarding the importance of this vaccine. Patient has been advised to call insurance company to determine out of pocket expense if they have not yet received this vaccine. Advised may also receive vaccine at local pharmacy or Health Dept. Verbalized acceptance and understanding.  Screening Tests Health Maintenance  Topic Date Due   DTaP/Tdap/Td (1 - Tdap) Never done   Zoster Vaccines- Shingrix (1 of 2) 02/14/2023 (Originally 06/30/1960)   Medicare Annual Wellness (AWV)  11/16/2023   Pneumonia Vaccine 52+ Years old  Completed   INFLUENZA VACCINE  Completed   DEXA SCAN  Completed   HPV VACCINES  Aged Out   COLONOSCOPY (Pts 45-68yr Insurance coverage will need to be confirmed)  Discontinued   COVID-19 Vaccine  Discontinued   Hepatitis C Screening  Discontinued    Health Maintenance Health Maintenance Due  Topic Date Due   DTaP/Tdap/Td (1 - Tdap) Never done   Mammogram- deferred per patient  Lung Cancer Screening: (Low Dose CT Chest recommended if Age 80-80years, 30 pack-year currently smoking OR have quit w/in 15years.) does not qualify.   Hepatitis C Screening: does not qualify  Vision Screening: Recommended annual ophthalmology exams for early detection of glaucoma and other disorders of the eye.  Dental Screening: Recommended annual dental exams for proper oral hygiene  Community Resource Referral / Chronic Care Management: CRR required this visit?  No   CCM required this visit?  No      Plan:     I have personally reviewed and noted the following in the patient's chart:   Medical and social history Use of alcohol, tobacco or illicit drugs  Current medications and supplements including opioid prescriptions. Patient is currently taking opioid prescriptions. Information provided to patient regarding non-opioid alternatives. Patient advised to discuss non-opioid treatment plan with their provider. Followed by Dr. CSharlet Salina  Functional ability  and status Nutritional status Physical activity Advanced directives List of other physicians Hospitalizations, surgeries, and ER visits in previous 12 months Vitals Screenings to include cognitive, depression, and falls Referrals and appointments  In addition, I have reviewed and discussed with patient certain preventive protocols, quality metrics, and best practice recommendations. A written personalized care plan for preventive services as well as general preventive health recommendations were provided to patient.     DLeta Jungling LPN   16/05/2093

## 2022-11-15 NOTE — Patient Instructions (Addendum)
Ms. Topor , Thank you for taking time to come for your Medicare Wellness Visit. I appreciate your ongoing commitment to your health goals. Please review the following plan we discussed and let me know if I can assist you in the future.   These are the goals we discussed:  Goals Addressed             This Visit's Progress    Increase physical activity   On track    Use the treadmill for exercise         This is a list of the screening recommended for you and due dates:  Health Maintenance  Topic Date Due   DTaP/Tdap/Td vaccine (1 - Tdap) Never done   Zoster (Shingles) Vaccine (1 of 2) 02/14/2023*   Medicare Annual Wellness Visit  11/16/2023   Pneumonia Vaccine  Completed   Flu Shot  Completed   DEXA scan (bone density measurement)  Completed   HPV Vaccine  Aged Out   Colon Cancer Screening  Discontinued   COVID-19 Vaccine  Discontinued   Hepatitis C Screening: USPSTF Recommendation to screen - Ages 72-79 yo.  Discontinued  *Topic was postponed. The date shown is not the original due date.    Advanced directives: End of life planning; Advance aging; Advanced directives discussed.  Copy of current HCPOA/Living Will requested.    Next appointment: Follow up in one year for your annual wellness visit    Preventive Care 65 Years and Older, Female Preventive care refers to lifestyle choices and visits with your health care provider that can promote health and wellness. What does preventive care include? A yearly physical exam. This is also called an annual well check. Dental exams once or twice a year. Routine eye exams. Ask your health care provider how often you should have your eyes checked. Personal lifestyle choices, including: Daily care of your teeth and gums. Regular physical activity. Eating a healthy diet. Avoiding tobacco and drug use. Limiting alcohol use. Practicing safe sex. Taking low-dose aspirin every day. Taking vitamin and mineral supplements as  recommended by your health care provider. What happens during an annual well check? The services and screenings done by your health care provider during your annual well check will depend on your age, overall health, lifestyle risk factors, and family history of disease. Counseling  Your health care provider may ask you questions about your: Alcohol use. Tobacco use. Drug use. Emotional well-being. Home and relationship well-being. Sexual activity. Eating habits. History of falls. Memory and ability to understand (cognition). Work and work Statistician. Reproductive health. Screening  You may have the following tests or measurements: Height, weight, and BMI. Blood pressure. Lipid and cholesterol levels. These may be checked every 5 years, or more frequently if you are over 27 years old. Skin check. Lung cancer screening. You may have this screening every year starting at age 19 if you have a 30-pack-year history of smoking and currently smoke or have quit within the past 15 years. Fecal occult blood test (FOBT) of the stool. You may have this test every year starting at age 72. Flexible sigmoidoscopy or colonoscopy. You may have a sigmoidoscopy every 5 years or a colonoscopy every 10 years starting at age 48. Hepatitis C blood test. Hepatitis B blood test. Sexually transmitted disease (STD) testing. Diabetes screening. This is done by checking your blood sugar (glucose) after you have not eaten for a while (fasting). You may have this done every 1-3 years. Bone density scan. This  is done to screen for osteoporosis. You may have this done starting at age 95. Mammogram. This may be done every 1-2 years. Talk to your health care provider about how often you should have regular mammograms. Talk with your health care provider about your test results, treatment options, and if necessary, the need for more tests. Vaccines  Your health care provider may recommend certain vaccines, such  as: Influenza vaccine. This is recommended every year. Tetanus, diphtheria, and acellular pertussis (Tdap, Td) vaccine. You may need a Td booster every 10 years. Zoster vaccine. You may need this after age 47. Pneumococcal 13-valent conjugate (PCV13) vaccine. One dose is recommended after age 71. Pneumococcal polysaccharide (PPSV23) vaccine. One dose is recommended after age 37. Talk to your health care provider about which screenings and vaccines you need and how often you need them. This information is not intended to replace advice given to you by your health care provider. Make sure you discuss any questions you have with your health care provider. Document Released: 11/24/2015 Document Revised: 07/17/2016 Document Reviewed: 08/29/2015 Elsevier Interactive Patient Education  2017 [place] Prevention in the Home Falls can cause injuries. They can happen to people of all ages. There are many things you can do to make your home safe and to help prevent falls. What can I do on the outside of my home? Regularly fix the edges of walkways and driveways and fix any cracks. Remove anything that might make you trip as you walk through a door, such as a raised step or threshold. Trim any bushes or trees on the path to your home. Use bright outdoor lighting. Clear any walking paths of anything that might make someone trip, such as rocks or tools. Regularly check to see if handrails are loose or broken. Make sure that both sides of any steps have handrails. Any raised decks and porches should have guardrails on the edges. Have any leaves, snow, or ice cleared regularly. Use sand or salt on walking paths during winter. Clean up any spills in your garage right away. This includes oil or grease spills. What can I do in the bathroom? Use night lights. Install grab bars by the toilet and in the tub and shower. Do not use towel bars as grab bars. Use non-skid mats or decals in the tub or  shower. If you need to sit down in the shower, use a plastic, non-slip stool. Keep the floor dry. Clean up any water that spills on the floor as soon as it happens. Remove soap buildup in the tub or shower regularly. Attach bath mats securely with double-sided non-slip rug tape. Do not have throw rugs and other things on the floor that can make you trip. What can I do in the bedroom? Use night lights. Make sure that you have a light by your bed that is easy to reach. Do not use any sheets or blankets that are too big for your bed. They should not hang down onto the floor. Have a firm chair that has side arms. You can use this for support while you get dressed. Do not have throw rugs and other things on the floor that can make you trip. What can I do in the kitchen? Clean up any spills right away. Avoid walking on wet floors. Keep items that you use a lot in easy-to-reach places. If you need to reach something above you, use a strong step stool that has a grab bar. Keep electrical cords out  of the way. Do not use floor polish or wax that makes floors slippery. If you must use wax, use non-skid floor wax. Do not have throw rugs and other things on the floor that can make you trip. What can I do with my stairs? Do not leave any items on the stairs. Make sure that there are handrails on both sides of the stairs and use them. Fix handrails that are broken or loose. Make sure that handrails are as long as the stairways. Check any carpeting to make sure that it is firmly attached to the stairs. Fix any carpet that is loose or worn. Avoid having throw rugs at the top or bottom of the stairs. If you do have throw rugs, attach them to the floor with carpet tape. Make sure that you have a light switch at the top of the stairs and the bottom of the stairs. If you do not have them, ask someone to add them for you. What else can I do to help prevent falls? Wear shoes that: Do not have high heels. Have  rubber bottoms. Are comfortable and fit you well. Are closed at the toe. Do not wear sandals. If you use a stepladder: Make sure that it is fully opened. Do not climb a closed stepladder. Make sure that both sides of the stepladder are locked into place. Ask someone to hold it for you, if possible. Clearly mark and make sure that you can see: Any grab bars or handrails. First and last steps. Where the edge of each step is. Use tools that help you move around (mobility aids) if they are needed. These include: Canes. Walkers. Scooters. Crutches. Turn on the lights when you go into a dark area. Replace any light bulbs as soon as they burn out. Set up your furniture so you have a clear path. Avoid moving your furniture around. If any of your floors are uneven, fix them. If there are any pets around you, be aware of where they are. Review your medicines with your doctor. Some medicines can make you feel dizzy. This can increase your chance of falling. Ask your doctor what other things that you can do to help prevent falls. This information is not intended to replace advice given to you by your health care provider. Make sure you discuss any questions you have with your health care provider. Document Released: 08/24/2009 Document Revised: 04/04/2016 Document Reviewed: 12/02/2014 Elsevier Interactive Patient Education  2017 [place]. Opioid Pain Medicine Management Opioids are powerful medicines that are used to treat moderate to severe pain. When used for short periods of time, they can help you to: Sleep better. Do better in physical or occupational therapy. Feel better in the first few days after an injury. Recover from surgery. Opioids should be taken with the supervision of a trained health care provider. They should be taken for the shortest period of time possible. This is because opioids can be addictive, and the longer you take opioids, the greater your risk of addiction. This  addiction can also be called opioid use disorder. What are the risks? Using opioid pain medicines for longer than 3 days increases your risk of side effects. Side effects include: Constipation. Nausea and vomiting. Breathing difficulties (respiratory depression). Drowsiness. Confusion. Opioid use disorder. Itching. Taking opioid pain medicine for a long period of time can affect your ability to do daily tasks. It also puts you at risk for: Motor vehicle crashes. Depression. Suicide. Heart attack. Overdose, which can be  life-threatening. What is a pain treatment plan? A pain treatment plan is an agreement between you and your health care provider. Pain is unique to each person, and treatments vary depending on your condition. To manage your pain, you and your health care provider need to work together. To help you do this: Discuss the goals of your treatment, including how much pain you might expect to have and how you will manage the pain. Review the risks and benefits of taking opioid medicines. Remember that a good treatment plan uses more than one approach and minimizes the chance of side effects. Be honest about the amount of medicines you take and about any drug or alcohol use. Get pain medicine prescriptions from only one health care provider. Pain can be managed with many types of alternative treatments. Ask your health care provider to refer you to one or more specialists who can help you manage pain through: Physical or occupational therapy. Counseling (cognitive behavioral therapy). Good nutrition. Biofeedback. Massage. Meditation. Non-opioid medicine. Following a gentle exercise program. How to use opioid pain medicine Taking medicine Take your pain medicine exactly as told by your health care provider. Take it only when you need it. If your pain gets less severe, you may take less than your prescribed dose if your health care provider approves. If you are not having  pain, do nottake pain medicine unless your health care provider tells you to take it. If your pain is severe, do nottry to treat it yourself by taking more pills than instructed on your prescription. Contact your health care provider for help. Write down the times when you take your pain medicine. It is easy to become confused while on pain medicine. Writing the time can help you avoid overdose. Take other over-the-counter or prescription medicines only as told by your health care provider. Keeping yourself and others safe  While you are taking opioid pain medicine: Do not drive, use machinery, or power tools. Do not sign legal documents. Do not drink alcohol. Do not take sleeping pills. Do not supervise children by yourself. Do not do activities that require climbing or being in high places. Do not go to a lake, river, ocean, spa, or swimming pool. Do not share your pain medicine with anyone. Keep pain medicine in a locked cabinet or in a secure area where pets and children cannot reach it. Stopping your use of opioids If you have been taking opioid medicine for more than a few weeks, you may need to slowly decrease (taper) how much you take until you stop completely. Tapering your use of opioids can decrease your risk of symptoms of withdrawal, such as: Pain and cramping in the abdomen. Nausea. Sweating. Sleepiness. Restlessness. Uncontrollable shaking (tremors). Cravings for the medicine. Do not attempt to taper your use of opioids on your own. Talk with your health care provider about how to do this. Your health care provider may prescribe a step-down schedule based on how much medicine you are taking and how long you have been taking it. Getting rid of leftover pills Do not save any leftover pills. Get rid of leftover pills safely by: Taking the medicine to a prescription take-back program. This is usually offered by the county or law enforcement. Bringing them to a pharmacy that  has a drug disposal container. Flushing them down the toilet. Check the label or package insert of your medicine to see whether this is safe to do. Throwing them out in the trash. Check the label or  package insert of your medicine to see whether this is safe to do. If it is safe to throw it out, remove the medicine from the original container, put it into a sealable bag or container, and mix it with used coffee grounds, food scraps, dirt, or cat litter before putting it in the trash. Follow these instructions at home: Activity Do exercises as told by your health care provider. Avoid activities that make your pain worse. Return to your normal activities as told by your health care provider. Ask your health care provider what activities are safe for you. General instructions You may need to take these actions to prevent or treat constipation: Drink enough fluid to keep your urine pale yellow. Take over-the-counter or prescription medicines. Eat foods that are high in fiber, such as beans, whole grains, and fresh fruits and vegetables. Limit foods that are high in fat and processed sugars, such as fried or sweet foods. Keep all follow-up visits. This is important. Where to find support If you have been taking opioids for a long time, you may benefit from receiving support for quitting from a local support group or counselor. Ask your health care provider for a referral to these resources in your area. Where to find more information Centers for Disease Control and Prevention (CDC): http://www.wolf.info/ U.[place]. Food and Drug Administration (FDA): [place] Get help right away if: You may have taken too much of an opioid (overdosed). Common symptoms of an overdose: Your breathing is slower or more shallow than normal. You have a very slow heartbeat (pulse). You have slurred speech. You have nausea and vomiting. Your pupils become very small. You have other potential symptoms: You are very confused. You  faint or feel like you will faint. You have cold, clammy skin. You have blue lips or fingernails. You have thoughts of harming yourself or harming others. These symptoms may represent a serious problem that is an emergency. Do not wait to see if the symptoms will go away. Get medical help right away. Call your local emergency services (911 in the [place] Do not drive yourself to the hospital.  If you ever feel like you may hurt yourself or others, or have thoughts about taking your own life, get help right away. Go to your nearest emergency department or: Call your local emergency services (911 in the [place] Call the [facility] 541-867-1290 in the [place] Call a suicide crisis helpline, such as the [place] [facility] at (505) 473-1804 or 988 in the [place]. This is open 24 hours a day in the [place] Text the Crisis Text Line at 724-619-1134 (in the [place].). Summary Opioid medicines can help you manage moderate to severe pain for a short period of time. A pain treatment plan is an agreement between you and your health care provider. Discuss the goals of your treatment, including how much pain you might expect to have and how you will manage the pain. If you think that you or someone else may have taken too much of an opioid, get medical help right away. This information is not intended to replace advice given to you by your health care provider. Make sure you discuss any questions you have with your health care provider. Document Revised: 05/23/2021 Document Reviewed: 02/07/2021 Elsevier Patient Education  [place].

## 2022-11-17 DIAGNOSIS — R0789 Other chest pain: Secondary | ICD-10-CM | POA: Diagnosis not present

## 2022-11-17 DIAGNOSIS — M79605 Pain in left leg: Secondary | ICD-10-CM | POA: Diagnosis not present

## 2022-11-17 DIAGNOSIS — S29009A Unspecified injury of muscle and tendon of unspecified wall of thorax, initial encounter: Secondary | ICD-10-CM | POA: Diagnosis not present

## 2022-11-17 DIAGNOSIS — M7989 Other specified soft tissue disorders: Secondary | ICD-10-CM | POA: Diagnosis not present

## 2022-11-17 DIAGNOSIS — M542 Cervicalgia: Secondary | ICD-10-CM | POA: Diagnosis not present

## 2022-11-17 DIAGNOSIS — M25562 Pain in left knee: Secondary | ICD-10-CM | POA: Diagnosis not present

## 2022-11-17 DIAGNOSIS — S199XXA Unspecified injury of neck, initial encounter: Secondary | ICD-10-CM | POA: Diagnosis not present

## 2022-11-17 DIAGNOSIS — Y92410 Unspecified street and highway as the place of occurrence of the external cause: Secondary | ICD-10-CM | POA: Diagnosis not present

## 2022-11-17 DIAGNOSIS — S8012XA Contusion of left lower leg, initial encounter: Secondary | ICD-10-CM | POA: Diagnosis not present

## 2022-11-17 DIAGNOSIS — I10 Essential (primary) hypertension: Secondary | ICD-10-CM | POA: Diagnosis not present

## 2022-11-17 DIAGNOSIS — Z041 Encounter for examination and observation following transport accident: Secondary | ICD-10-CM | POA: Diagnosis not present

## 2022-11-17 DIAGNOSIS — M47812 Spondylosis without myelopathy or radiculopathy, cervical region: Secondary | ICD-10-CM | POA: Diagnosis not present

## 2022-11-17 DIAGNOSIS — R079 Chest pain, unspecified: Secondary | ICD-10-CM | POA: Diagnosis not present

## 2022-11-20 ENCOUNTER — Ambulatory Visit (INDEPENDENT_AMBULATORY_CARE_PROVIDER_SITE_OTHER): Payer: Medicare HMO | Admitting: Nurse Practitioner

## 2022-11-20 ENCOUNTER — Encounter: Payer: Self-pay | Admitting: Nurse Practitioner

## 2022-11-20 VITALS — BP 118/70 | HR 62 | Temp 98.0°F | Ht 60.0 in | Wt 162.8 lb

## 2022-11-20 DIAGNOSIS — M79604 Pain in right leg: Secondary | ICD-10-CM | POA: Diagnosis not present

## 2022-11-20 DIAGNOSIS — T148XXA Other injury of unspecified body region, initial encounter: Secondary | ICD-10-CM

## 2022-11-20 NOTE — Assessment & Plan Note (Addendum)
Imaging from ED reviewed. Advised patient she will continue to be sore and stiff for a few days to weeks. Multiple large bruises noted on exam. Encouraged her to ice/heat areas that are painful. She has pain medication, Oxycodone and Tramadol, at home to use as well as Tylenol and Ibuprofen.
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
## 2022-11-21 ENCOUNTER — Telehealth: Payer: Self-pay | Admitting: Family Medicine
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
## 2022-11-22 ENCOUNTER — Ambulatory Visit: Payer: Medicare HMO

## 2022-11-22 DIAGNOSIS — S9031XA Contusion of right foot, initial encounter: Secondary | ICD-10-CM | POA: Diagnosis not present

## 2022-11-22 DIAGNOSIS — M79651 Pain in right thigh: Secondary | ICD-10-CM | POA: Diagnosis not present

## 2022-11-22 DIAGNOSIS — M19071 Primary osteoarthritis, right ankle and foot: Secondary | ICD-10-CM | POA: Diagnosis not present

## 2022-11-22 DIAGNOSIS — M7989 Other specified soft tissue disorders: Secondary | ICD-10-CM | POA: Diagnosis not present

## 2022-11-22 DIAGNOSIS — S9001XA Contusion of right ankle, initial encounter: Secondary | ICD-10-CM | POA: Diagnosis not present

## 2022-11-22 DIAGNOSIS — S8011XA Contusion of right lower leg, initial encounter: Secondary | ICD-10-CM | POA: Diagnosis not present

## 2022-11-22 DIAGNOSIS — Z881 Allergy status to other antibiotic agents status: Secondary | ICD-10-CM | POA: Diagnosis not present

## 2022-11-22 DIAGNOSIS — M25551 Pain in right hip: Secondary | ICD-10-CM | POA: Diagnosis not present

## 2022-11-22 DIAGNOSIS — S7011XA Contusion of right thigh, initial encounter: Secondary | ICD-10-CM | POA: Diagnosis not present

## 2022-11-22 DIAGNOSIS — M79604 Pain in right leg: Secondary | ICD-10-CM | POA: Diagnosis not present

## 2022-11-22 NOTE — Telephone Encounter (Signed)
I spoke with the patient and she stated her leg is swelling more and I scheduled her to see someone at [facility] across the [address] today.  Bransyn Adami,cma

## 2022-11-28 ENCOUNTER — Ambulatory Visit: Payer: Medicare HMO | Admitting: Gastroenterology
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
## 2022-12-02 NOTE — Telephone Encounter (Signed)
She should just be able to call a chiropractor and set up an appointment without a referral.

## 2022-12-03 NOTE — Telephone Encounter (Signed)
I called the patient and she stated she already saw a chiropractor because she could not wait but she understood she did not need a referral.  Cathy Coffey,cma

## 2022-12-13 DIAGNOSIS — M47816 Spondylosis without myelopathy or radiculopathy, lumbar region: Secondary | ICD-10-CM | POA: Diagnosis not present

## 2022-12-26 DIAGNOSIS — H66001 Acute suppurative otitis media without spontaneous rupture of ear drum, right ear: Secondary | ICD-10-CM | POA: Diagnosis not present

## 2022-12-26 DIAGNOSIS — B9689 Other specified bacterial agents as the cause of diseases classified elsewhere: Secondary | ICD-10-CM | POA: Diagnosis not present

## 2022-12-26 DIAGNOSIS — Z03818 Encounter for observation for suspected exposure to other biological agents ruled out: Secondary | ICD-10-CM | POA: Diagnosis not present

## 2022-12-26 DIAGNOSIS — J019 Acute sinusitis, unspecified: Secondary | ICD-10-CM | POA: Diagnosis not present

## 2022-12-31 ENCOUNTER — Ambulatory Visit (INDEPENDENT_AMBULATORY_CARE_PROVIDER_SITE_OTHER): Payer: Medicare HMO | Admitting: Nurse Practitioner

## 2022-12-31 ENCOUNTER — Ambulatory Visit: Payer: Medicare HMO | Admitting: Family Medicine

## 2022-12-31 ENCOUNTER — Encounter: Payer: Self-pay | Admitting: Nurse Practitioner

## 2022-12-31 VITALS — BP 118/64 | HR 77 | Temp 97.7°F | Ht 60.0 in | Wt 163.8 lb

## 2022-12-31 DIAGNOSIS — M79671 Pain in right foot: Secondary | ICD-10-CM | POA: Diagnosis not present

## 2022-12-31 DIAGNOSIS — E538 Deficiency of other specified B group vitamins: Secondary | ICD-10-CM | POA: Diagnosis not present

## 2022-12-31 DIAGNOSIS — G3184 Mild cognitive impairment, so stated: Secondary | ICD-10-CM | POA: Diagnosis not present

## 2022-12-31 DIAGNOSIS — R69 Illness, unspecified: Secondary | ICD-10-CM | POA: Diagnosis not present

## 2022-12-31 HISTORY — DX: Pain in right foot: M79.671

## 2022-12-31 NOTE — Patient Instructions (Signed)
It was nice to see you again.   I have sent the referral to Ortho. They should contact you to schedule an appointment.
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
## 2023-01-02 ENCOUNTER — Other Ambulatory Visit (HOSPITAL_COMMUNITY): Payer: Self-pay | Admitting: Student

## 2023-01-02 DIAGNOSIS — G3184 Mild cognitive impairment, so stated: Secondary | ICD-10-CM

## 2023-01-02 DIAGNOSIS — F0781 Postconcussional syndrome: Secondary | ICD-10-CM

## 2023-01-03 DIAGNOSIS — M76821 Posterior tibial tendinitis, right leg: Secondary | ICD-10-CM | POA: Diagnosis not present

## 2023-01-06 ENCOUNTER — Other Ambulatory Visit (HOSPITAL_COMMUNITY): Payer: Self-pay | Admitting: Student

## 2023-01-06 DIAGNOSIS — G3184 Mild cognitive impairment, so stated: Secondary | ICD-10-CM

## 2023-01-06 DIAGNOSIS — F0781 Postconcussional syndrome: Secondary | ICD-10-CM

## 2023-01-21 DIAGNOSIS — M5136 Other intervertebral disc degeneration, lumbar region: Secondary | ICD-10-CM | POA: Diagnosis not present

## 2023-01-21 DIAGNOSIS — M542 Cervicalgia: Secondary | ICD-10-CM | POA: Diagnosis not present

## 2023-01-21 DIAGNOSIS — R279 Unspecified lack of coordination: Secondary | ICD-10-CM | POA: Diagnosis not present

## 2023-01-21 DIAGNOSIS — M47816 Spondylosis without myelopathy or radiculopathy, lumbar region: Secondary | ICD-10-CM | POA: Diagnosis not present

## 2023-01-22 ENCOUNTER — Ambulatory Visit (HOSPITAL_COMMUNITY)
Admission: RE | Admit: 2023-01-22 | Discharge: 2023-01-22 | Disposition: A | Discharge status: Home / Self Care | Payer: Medicare HMO | Source: Ambulatory Visit | Attending: Student | Admitting: Student

## 2023-01-22 DIAGNOSIS — G3184 Mild cognitive impairment, so stated: Secondary | ICD-10-CM

## 2023-01-22 DIAGNOSIS — F0781 Postconcussional syndrome: Secondary | ICD-10-CM | POA: Diagnosis not present

## 2023-01-22 DIAGNOSIS — R69 Illness, unspecified: Secondary | ICD-10-CM | POA: Diagnosis not present

## 2023-01-23 DIAGNOSIS — M542 Cervicalgia: Secondary | ICD-10-CM | POA: Diagnosis not present

## 2023-01-23 DIAGNOSIS — R279 Unspecified lack of coordination: Secondary | ICD-10-CM | POA: Diagnosis not present

## 2023-01-30 DIAGNOSIS — R279 Unspecified lack of coordination: Secondary | ICD-10-CM | POA: Diagnosis not present

## 2023-01-30 DIAGNOSIS — M542 Cervicalgia: Secondary | ICD-10-CM | POA: Diagnosis not present

## 2023-02-04 DIAGNOSIS — M542 Cervicalgia: Secondary | ICD-10-CM | POA: Diagnosis not present

## 2023-02-04 DIAGNOSIS — R279 Unspecified lack of coordination: Secondary | ICD-10-CM | POA: Diagnosis not present

## 2023-02-05 ENCOUNTER — Ambulatory Visit: Payer: Medicare HMO | Admitting: Dermatology

## 2023-02-05 ENCOUNTER — Encounter: Payer: Self-pay | Admitting: Dermatology

## 2023-02-05 VITALS — BP 117/70 | HR 82

## 2023-02-05 DIAGNOSIS — D492 Neoplasm of unspecified behavior of bone, soft tissue, and skin: Secondary | ICD-10-CM

## 2023-02-05 DIAGNOSIS — L814 Other melanin hyperpigmentation: Secondary | ICD-10-CM | POA: Diagnosis not present

## 2023-02-05 DIAGNOSIS — L821 Other seborrheic keratosis: Secondary | ICD-10-CM

## 2023-02-05 DIAGNOSIS — L578 Other skin changes due to chronic exposure to nonionizing radiation: Secondary | ICD-10-CM | POA: Diagnosis not present

## 2023-02-05 DIAGNOSIS — D229 Melanocytic nevi, unspecified: Secondary | ICD-10-CM

## 2023-02-05 DIAGNOSIS — Z1283 Encounter for screening for malignant neoplasm of skin: Secondary | ICD-10-CM | POA: Diagnosis not present

## 2023-02-05 DIAGNOSIS — D224 Melanocytic nevi of scalp and neck: Secondary | ICD-10-CM

## 2023-02-05 DIAGNOSIS — D1801 Hemangioma of skin and subcutaneous tissue: Secondary | ICD-10-CM

## 2023-02-05 NOTE — Progress Notes (Signed)
   Follow-Up Visit   Subjective  Cathy Coffey is a 82 y.o. female who presents for the following: Skin Cancer Screening and Full Body Skin Exam The patient presents for Total-Body Skin Exam (TBSE) for skin cancer screening and mole check. The patient has spots, moles and lesions to be evaluated, some may be new or changing and the patient has concerns that these could be cancer.  The following portions of the chart were reviewed this encounter and updated as appropriate: medications, allergies, medical history  Review of Systems:  No other skin or systemic complaints except as noted in HPI or Assessment and Plan.  Objective  Well appearing patient in no apparent distress; mood and affect are within normal limits.  A full examination was performed including scalp, head, eyes, ears, nose, lips, neck, chest, axillae, abdomen, back, buttocks, bilateral upper extremities, bilateral lower extremities, hands, feet, fingers, toes, fingernails, and toenails. All findings within normal limits unless otherwise noted below.   Relevant physical exam findings are noted in the Assessment and Plan.  left posterior base of neck 0.6 cm flesh papule        Assessment & Plan   LENTIGINES, SEBORRHEIC KERATOSES, HEMANGIOMAS - Benign normal skin lesions - Benign-appearing - Call for any changes  MELANOCYTIC NEVI - Tan-brown and/or pink-flesh-colored symmetric macules and papules - Benign appearing on exam today - Observation - Call clinic for new or changing moles - Recommend daily use of broad spectrum spf 30+ sunscreen to sun-exposed areas.   ACTINIC DAMAGE - Chronic condition, secondary to cumulative UV/sun exposure - diffuse scaly erythematous macules with underlying dyspigmentation - Recommend daily broad spectrum sunscreen SPF 30+ to sun-exposed areas, reapply every 2 hours as needed.  - Staying in the shade or wearing long sleeves, sun glasses (UVA+UVB protection) and wide brim hats  (4-inch brim around the entire circumference of the hat) are also recommended for sun protection.  - Call for new or changing lesions.  SKIN CANCER SCREENING PERFORMED TODAY.  Neoplasm of skin left posterior base of neck  Epidermal / dermal shaving  Lesion diameter (cm):  0.6 Informed consent: discussed and consent obtained   Timeout: patient name, date of birth, surgical site, and procedure verified   Procedure prep:  Patient was prepped and draped in usual sterile fashion Prep type:  Isopropyl alcohol Anesthesia: the lesion was anesthetized in a standard fashion   Anesthetic:  1% lidocaine w/ epinephrine 1-100,000 buffered w/ 8.4% NaHCO3 Hemostasis achieved with: pressure, aluminum chloride and electrodesiccation   Outcome: patient tolerated procedure well   Post-procedure details: sterile dressing applied and wound care instructions given   Dressing type: bandage and petrolatum    Specimen 1 - Surgical pathology Differential Diagnosis: R/O Irritated nevus vs Dysplastic nevus   Check Margins: No   Return if symptoms worsen or fail to improve.  IMarye Round, CMA, am acting as scribe for Sarina Ser, MD .   Documentation: I have reviewed the above documentation for accuracy and completeness, and I agree with the above.  Sarina Ser, MD

## 2023-02-05 NOTE — Patient Instructions (Addendum)
Wound Care Instructions  Cleanse wound gently with soap and water once a day then pat dry with clean gauze. Apply a thin coat of Petrolatum (petroleum jelly, "Vaseline") over the wound (unless you have an allergy to this). We recommend that you use a new, sterile tube of Vaseline. Do not pick or remove scabs. Do not remove the yellow or white "healing tissue" from the base of the wound.  Cover the wound with fresh, clean, nonstick gauze and secure with paper tape. You may use Band-Aids in place of gauze and tape if the wound is small enough, but would recommend trimming much of the tape off as there is often too much. Sometimes Band-Aids can irritate the skin.  You should call the office for your biopsy report after 1 week if you have not already been contacted.  If you experience any problems, such as abnormal amounts of bleeding, swelling, significant bruising, significant pain, or evidence of infection, please call the office immediately.  FOR ADULT SURGERY PATIENTS: If you need something for pain relief you may take 1 extra strength Tylenol (acetaminophen) AND 2 Ibuprofen (200mg each) together every 4 hours as needed for pain. (do not take these if you are allergic to them or if you have a reason you should not take them.) Typically, you may only need pain medication for 1 to 3 days.     Due to recent changes in healthcare laws, you may see results of your pathology and/or laboratory studies on MyChart before the doctors have had a chance to review them. We understand that in some cases there may be results that are confusing or concerning to you. Please understand that not all results are received at the same time and often the doctors may need to interpret multiple results in order to provide you with the best plan of care or course of treatment. Therefore, we ask that you please give us 2 business days to thoroughly review all your results before contacting the office for clarification. Should  we see a critical lab result, you will be contacted sooner.   If You Need Anything After Your Visit  If you have any questions or concerns for your doctor, please call our main line at [phone] and press option 4 to reach your doctor's medical assistant. If no one answers, please leave a voicemail as directed and we will return your call as soon as possible. Messages left after 4 pm will be answered the following business day.   You may also send us a message via MyChart. We typically respond to MyChart messages within 1-2 business days.  For prescription refills, please ask your pharmacy to contact our office. Our fax number is [phone].  If you have an urgent issue when the clinic is closed that cannot wait until the next business day, you can page your doctor at the number below.    Please note that while we do our best to be available for urgent issues outside of office hours, we are not available 24/7.   If you have an urgent issue and are unable to reach us, you may choose to seek medical care at your doctor's office, retail clinic, urgent care center, or emergency room.  If you have a medical emergency, please immediately call 911 or go to the emergency department.  Pager Numbers  - Dr. Kowalski: [phone]  - Dr. Moye: [phone]  - Dr. Stewart: [phone]  In the event of inclement weather, please call our main line at   [phone] for an update on the status of any delays or closures.  Dermatology Medication Tips: Please keep the boxes that topical medications come in in order to help keep track of the instructions about where and how to use these. Pharmacies typically print the medication instructions only on the boxes and not directly on the medication tubes.   If your medication is too expensive, please contact our office at [phone] option 4 or send us a message through MyChart.   We are unable to tell what your co-pay for medications will be in  advance as this is different depending on your insurance coverage. However, we may be able to find a substitute medication at lower cost or fill out paperwork to get insurance to cover a needed medication.   If a prior authorization is required to get your medication covered by your insurance company, please allow us 1-2 business days to complete this process.  Drug prices often vary depending on where the prescription is filled and some pharmacies may offer cheaper prices.  The website www.goodrx.com contains coupons for medications through different pharmacies. The prices here do not account for what the cost may be with help from insurance (it may be cheaper with your insurance), but the website can give you the price if you did not use any insurance.  - You can print the associated coupon and take it with your prescription to the pharmacy.  - You may also stop by our office during regular business hours and pick up a GoodRx coupon card.  - If you need your prescription sent electronically to a different pharmacy, notify our office through [facility] MyChart or by phone at [phone] option 4.     Si Usted Necesita Algo Despus de Su Visita  Tambin puede enviarnos un mensaje a travs de MyChart. Por lo general respondemos a los mensajes de MyChart en el transcurso de 1 a 2 das hbiles.  Para renovar recetas, por favor pida a su farmacia que se ponga en contacto con nuestra oficina. Nuestro nmero de fax es el [phone].  Si tiene un asunto urgente cuando la clnica est cerrada y que no puede esperar hasta el siguiente da hbil, puede llamar/localizar a su doctor(a) al nmero que aparece a continuacin.   Por favor, tenga en cuenta que aunque hacemos todo lo posible para estar disponibles para asuntos urgentes fuera del horario de oficina, no estamos disponibles las 24 horas del da, los 7 das de la semana.   Si tiene un problema urgente y no puede comunicarse con nosotros, puede  optar por buscar atencin mdica  en el consultorio de su doctor(a), en una clnica privada, en un centro de atencin urgente o en una sala de emergencias.  Si tiene una emergencia mdica, por favor llame inmediatamente al 911 o vaya a la sala de emergencias.  Nmeros de bper  - Dr. Kowalski: [phone]  - Dra. Moye: [phone]  - Dra. Stewart: [phone]  En caso de inclemencias del tiempo, por favor llame a nuestra lnea principal al [phone] para una actualizacin sobre el estado de cualquier retraso o cierre.  Consejos para la medicacin en dermatologa: Por favor, guarde las cajas en las que vienen los medicamentos de uso tpico para ayudarle a seguir las instrucciones sobre dnde y cmo usarlos. Las farmacias generalmente imprimen las instrucciones del medicamento slo en las cajas y no directamente en los tubos del medicamento.   Si su medicamento es muy caro, por favor, pngase en contacto con   nuestra oficina llamando al [phone] y presione la opcin 4 o envenos un mensaje a travs de MyChart.   No podemos decirle cul ser su copago por los medicamentos por adelantado ya que esto es diferente dependiendo de la cobertura de su seguro. Sin embargo, es posible que podamos encontrar un medicamento sustituto a menor costo o llenar un formulario para que el seguro cubra el medicamento que se considera necesario.   Si se requiere una autorizacin previa para que su compaa de seguros cubra su medicamento, por favor permtanos de 1 a 2 das hbiles para completar este proceso.  Los precios de los medicamentos varan con frecuencia dependiendo del lugar de dnde se surte la receta y alguna farmacias pueden ofrecer precios ms baratos.  El sitio web www.goodrx.com tiene cupones para medicamentos de diferentes farmacias. Los precios aqu no tienen en cuenta lo que podra costar con la ayuda del seguro (puede ser ms barato con su seguro), pero el sitio web puede darle el  precio si no utiliz ningn seguro.  - Puede imprimir el cupn correspondiente y llevarlo con su receta a la farmacia.  - Tambin puede pasar por nuestra oficina durante el horario de atencin regular y recoger una tarjeta de cupones de GoodRx.  - Si necesita que su receta se enve electrnicamente a una farmacia diferente, informe a nuestra oficina a travs de MyChart de [facility] o por telfono llamando al [phone] y presione la opcin 4.  

## 2023-02-06 DIAGNOSIS — M542 Cervicalgia: Secondary | ICD-10-CM | POA: Diagnosis not present

## 2023-02-06 DIAGNOSIS — R279 Unspecified lack of coordination: Secondary | ICD-10-CM | POA: Diagnosis not present

## 2023-02-10 DIAGNOSIS — M542 Cervicalgia: Secondary | ICD-10-CM | POA: Diagnosis not present

## 2023-02-10 DIAGNOSIS — R279 Unspecified lack of coordination: Secondary | ICD-10-CM | POA: Diagnosis not present

## 2023-02-12 DIAGNOSIS — I6789 Other cerebrovascular disease: Secondary | ICD-10-CM | POA: Diagnosis not present

## 2023-02-12 DIAGNOSIS — R69 Illness, unspecified: Secondary | ICD-10-CM | POA: Diagnosis not present

## 2023-02-12 DIAGNOSIS — M5136 Other intervertebral disc degeneration, lumbar region: Secondary | ICD-10-CM | POA: Diagnosis not present

## 2023-02-12 DIAGNOSIS — G3184 Mild cognitive impairment, so stated: Secondary | ICD-10-CM | POA: Diagnosis not present

## 2023-02-12 DIAGNOSIS — N3941 Urge incontinence: Secondary | ICD-10-CM | POA: Diagnosis not present

## 2023-02-12 DIAGNOSIS — M4150 Other secondary scoliosis, site unspecified: Secondary | ICD-10-CM | POA: Insufficient documentation

## 2023-02-13 ENCOUNTER — Telehealth: Payer: Self-pay

## 2023-02-13 DIAGNOSIS — R279 Unspecified lack of coordination: Secondary | ICD-10-CM | POA: Diagnosis not present

## 2023-02-13 DIAGNOSIS — M542 Cervicalgia: Secondary | ICD-10-CM | POA: Diagnosis not present

## 2023-02-13 NOTE — Telephone Encounter (Signed)
Patient advised pathology showed benign irritated nevus. Lurlean Horns., RMA

## 2023-02-13 NOTE — Telephone Encounter (Signed)
-----   Message from Ralene Bathe, MD sent at 02/12/2023  2:26 PM EDT ----- Diagnosis Skin , left posterior base of neck MELANOCYTIC NEVUS, INTRADERMAL TYPE, IRRITATED  Benign irritated mole No further treatment needed

## 2023-02-17 DIAGNOSIS — M542 Cervicalgia: Secondary | ICD-10-CM | POA: Diagnosis not present

## 2023-02-17 DIAGNOSIS — R279 Unspecified lack of coordination: Secondary | ICD-10-CM | POA: Diagnosis not present

## 2023-02-19 DIAGNOSIS — R279 Unspecified lack of coordination: Secondary | ICD-10-CM | POA: Diagnosis not present

## 2023-02-19 DIAGNOSIS — M542 Cervicalgia: Secondary | ICD-10-CM | POA: Diagnosis not present

## 2023-02-24 DIAGNOSIS — R279 Unspecified lack of coordination: Secondary | ICD-10-CM | POA: Diagnosis not present

## 2023-02-24 DIAGNOSIS — M542 Cervicalgia: Secondary | ICD-10-CM | POA: Diagnosis not present

## 2023-02-26 DIAGNOSIS — M542 Cervicalgia: Secondary | ICD-10-CM | POA: Diagnosis not present

## 2023-02-26 DIAGNOSIS — R279 Unspecified lack of coordination: Secondary | ICD-10-CM | POA: Diagnosis not present

## 2023-02-27 ENCOUNTER — Other Ambulatory Visit: Payer: Self-pay | Admitting: Family Medicine

## 2023-02-27 DIAGNOSIS — R1013 Epigastric pain: Secondary | ICD-10-CM

## 2023-03-03 DIAGNOSIS — M542 Cervicalgia: Secondary | ICD-10-CM | POA: Diagnosis not present

## 2023-03-03 DIAGNOSIS — R279 Unspecified lack of coordination: Secondary | ICD-10-CM | POA: Diagnosis not present

## 2023-03-05 DIAGNOSIS — M542 Cervicalgia: Secondary | ICD-10-CM | POA: Diagnosis not present

## 2023-03-05 DIAGNOSIS — R279 Unspecified lack of coordination: Secondary | ICD-10-CM | POA: Diagnosis not present

## 2023-03-10 ENCOUNTER — Ambulatory Visit (INDEPENDENT_AMBULATORY_CARE_PROVIDER_SITE_OTHER): Payer: Medicare HMO | Admitting: Family Medicine

## 2023-03-10 ENCOUNTER — Encounter: Payer: Self-pay | Admitting: Family Medicine

## 2023-03-10 VITALS — BP 118/66 | HR 56 | Temp 98.4°F | Ht 60.0 in | Wt 169.0 lb

## 2023-03-10 DIAGNOSIS — M810 Age-related osteoporosis without current pathological fracture: Secondary | ICD-10-CM | POA: Diagnosis not present

## 2023-03-10 DIAGNOSIS — G4733 Obstructive sleep apnea (adult) (pediatric): Secondary | ICD-10-CM

## 2023-03-10 DIAGNOSIS — R6889 Other general symptoms and signs: Secondary | ICD-10-CM | POA: Diagnosis not present

## 2023-03-10 DIAGNOSIS — R279 Unspecified lack of coordination: Secondary | ICD-10-CM | POA: Diagnosis not present

## 2023-03-10 DIAGNOSIS — M542 Cervicalgia: Secondary | ICD-10-CM | POA: Diagnosis not present

## 2023-03-10 DIAGNOSIS — E039 Hypothyroidism, unspecified: Secondary | ICD-10-CM | POA: Diagnosis not present

## 2023-03-10 LAB — TSH: TSH: 1.89 u[IU]/mL (ref 0.35–5.50)

## 2023-03-10 LAB — MAGNESIUM: Magnesium: 2.4 mg/dL (ref 1.5–2.5)

## 2023-03-10 NOTE — Assessment & Plan Note (Signed)
Chronic issue.  Check TSH.  Continue Synthroid 100 mcg daily. 

## 2023-03-10 NOTE — Assessment & Plan Note (Addendum)
Chronic issue.  I encouraged continued CPAP use.  Plan to check a compliance report at next visit.  Will also check with [employer] on getting a different mask for her to try given that she is not sure the nasal pillows fit appropriately.

## 2023-03-10 NOTE — Progress Notes (Signed)
Marikay Alar, MD Phone: [phone]  Cathy Coffey is a 82 y.o. female who presents today for f/u.  OSA CPAP use: nightly, though only recently started using this nightly Hypersomnia: improved  Well rested: generally CPAP company: [employer] Patient is not sure the nasal pillows she has fit correctly.  HYPOTHYROIDISM Disease Monitoring Weight changes: no  Skin Changes: no Heat/Cold intolerance: no  Medication Monitoring Compliance:  taking synthroid   Last TSH:   Lab Results  Component Value Date   TSH 3.43 07/31/2022   Osteoporosis: Patient started back taking Fosamax.  She notes no fractures.  Patient notes she is taking magnesium 2 tablets daily.  She wonders about checking this today.  Patient also asked a question about her granddaughter who is 74 years old and is not my patient.  She noted her granddaughter had seen several specialists and psychiatrist and has not been able to get a diagnosis for what is going on with her.  She notes her granddaughter is not able to turn her mind off.  She graduated high school at age 6.  She has since gotten a job to help occupy her mind.  She wonders if there is any resources in our area.  Advised that I was not really aware of anything for her age group in [place] though [employer] does have a psychology program that may be helpful.  I also advised that she should discuss this further with the granddaughters primary care doctor.  Social History   Tobacco Use  Smoking Status Never  Smokeless Tobacco Never    Current Outpatient Medications on File Prior to Visit  Medication Sig Dispense Refill   alendronate (FOSAMAX) 70 MG tablet Take 70 mg by mouth once a week.     B Complex-C (SUPER B COMPLEX PO) Take 1 capsule by mouth.     Calcium Carbonate-Vitamin D (CALCIUM-VITAMIN D) 500-200 MG-UNIT per tablet Take 3 tablets by mouth daily.      levothyroxine (SYNTHROID) 100 MCG tablet TAKE 1 TABLET DAILY 90 tablet 3   Magnesium 250 MG TABS  Take 400 mg by mouth.     Multiple Vitamins-Minerals (PRESERVISION AREDS PO) Take 1 tablet by mouth daily.      Omega-3 Fatty Acids (FISH OIL) 1000 MG CPDR Take 3,000 mg by mouth daily.      pantoprazole (PROTONIX) 40 MG tablet TAKE 1 TABLET BY MOUTH EVERY DAY 90 tablet 3   POTASSIUM PO Take 600 mg by mouth.     Probiotic Product (TRUBIOTICS PO) Take 1 capsule by mouth.     TRAMADOL HCL ER PO Take by mouth. (Patient not taking: Reported on 03/10/2023)     No current facility-administered medications on file prior to visit.     ROS see history of present illness  Objective  Physical Exam Vitals:   03/10/23 0950  BP: 118/66  Pulse: (!) 56  Temp: 98.4 F (36.9 C)  SpO2: 99%    BP Readings from Last 3 Encounters:  03/10/23 118/66  02/05/23 117/70  12/31/22 118/64   Wt Readings from Last 3 Encounters:  03/10/23 169 lb (76.7 kg)  12/31/22 163 lb 12.8 oz (74.3 kg)  11/20/22 162 lb 12.8 oz (73.8 kg)    Physical Exam Constitutional:      General: She is not in acute distress.    Appearance: She is not diaphoretic.  Cardiovascular:     Rate and Rhythm: Normal rate and regular rhythm.     Heart sounds: Normal heart sounds.  Pulmonary:     Effort: Pulmonary effort is normal.     Breath sounds: Normal breath sounds.  Skin:    General: Skin is warm and dry.  Neurological:     Mental Status: She is alert.      Assessment/Plan: Please see individual problem list.  OSA (obstructive sleep apnea) Assessment & Plan: Chronic issue.  I encouraged continued CPAP use.  Plan to check a compliance report at next visit.  Will also check with [employer] on getting a different mask for her to try given that she is not sure the nasal pillows fit appropriately.   Age-related osteoporosis without current pathological fracture Assessment & Plan: Chronic issue.  Patient can continue Fosamax 70 mg weekly.  She will also continue vitamin D supplementation.   Hypothyroidism, unspecified  type Assessment & Plan: Chronic issue.  Check TSH.  Continue Synthroid 100 mcg daily.  Orders: -     TSH  Excessive magnesium intake -     Magnesium     Return in about 6 months (around 09/09/2023).   Marikay Alar, MD [facility]

## 2023-03-10 NOTE — Assessment & Plan Note (Signed)
Chronic issue.  Patient can continue Fosamax 70 mg weekly.  She will also continue vitamin D supplementation.

## 2023-03-11 DIAGNOSIS — M5416 Radiculopathy, lumbar region: Secondary | ICD-10-CM | POA: Diagnosis not present

## 2023-03-11 DIAGNOSIS — M9901 Segmental and somatic dysfunction of cervical region: Secondary | ICD-10-CM | POA: Diagnosis not present

## 2023-03-11 DIAGNOSIS — M9903 Segmental and somatic dysfunction of lumbar region: Secondary | ICD-10-CM | POA: Diagnosis not present

## 2023-03-11 DIAGNOSIS — M542 Cervicalgia: Secondary | ICD-10-CM | POA: Diagnosis not present

## 2023-03-14 DIAGNOSIS — M542 Cervicalgia: Secondary | ICD-10-CM | POA: Diagnosis not present

## 2023-03-14 DIAGNOSIS — R279 Unspecified lack of coordination: Secondary | ICD-10-CM | POA: Diagnosis not present

## 2023-03-20 DIAGNOSIS — Z03818 Encounter for observation for suspected exposure to other biological agents ruled out: Secondary | ICD-10-CM | POA: Diagnosis not present

## 2023-03-20 DIAGNOSIS — Z8709 Personal history of other diseases of the respiratory system: Secondary | ICD-10-CM | POA: Diagnosis not present

## 2023-03-20 DIAGNOSIS — J069 Acute upper respiratory infection, unspecified: Secondary | ICD-10-CM | POA: Diagnosis not present

## 2023-03-25 DIAGNOSIS — M5416 Radiculopathy, lumbar region: Secondary | ICD-10-CM | POA: Diagnosis not present

## 2023-03-25 DIAGNOSIS — M542 Cervicalgia: Secondary | ICD-10-CM | POA: Diagnosis not present

## 2023-03-25 DIAGNOSIS — M9901 Segmental and somatic dysfunction of cervical region: Secondary | ICD-10-CM | POA: Diagnosis not present

## 2023-03-25 DIAGNOSIS — M9903 Segmental and somatic dysfunction of lumbar region: Secondary | ICD-10-CM | POA: Diagnosis not present

## 2023-04-03 DIAGNOSIS — H35373 Puckering of macula, bilateral: Secondary | ICD-10-CM | POA: Diagnosis not present

## 2023-04-03 DIAGNOSIS — H35363 Drusen (degenerative) of macula, bilateral: Secondary | ICD-10-CM | POA: Diagnosis not present

## 2023-04-03 DIAGNOSIS — H52223 Regular astigmatism, bilateral: Secondary | ICD-10-CM | POA: Diagnosis not present

## 2023-04-03 DIAGNOSIS — H40023 Open angle with borderline findings, high risk, bilateral: Secondary | ICD-10-CM | POA: Diagnosis not present

## 2023-04-03 DIAGNOSIS — H5203 Hypermetropia, bilateral: Secondary | ICD-10-CM | POA: Diagnosis not present

## 2023-04-03 DIAGNOSIS — H524 Presbyopia: Secondary | ICD-10-CM | POA: Diagnosis not present

## 2023-04-08 DIAGNOSIS — M5416 Radiculopathy, lumbar region: Secondary | ICD-10-CM | POA: Diagnosis not present

## 2023-04-08 DIAGNOSIS — M542 Cervicalgia: Secondary | ICD-10-CM | POA: Diagnosis not present

## 2023-04-08 DIAGNOSIS — M9903 Segmental and somatic dysfunction of lumbar region: Secondary | ICD-10-CM | POA: Diagnosis not present

## 2023-04-08 DIAGNOSIS — M9901 Segmental and somatic dysfunction of cervical region: Secondary | ICD-10-CM | POA: Diagnosis not present

## 2023-04-22 DIAGNOSIS — M5416 Radiculopathy, lumbar region: Secondary | ICD-10-CM | POA: Diagnosis not present

## 2023-04-22 DIAGNOSIS — M9901 Segmental and somatic dysfunction of cervical region: Secondary | ICD-10-CM | POA: Diagnosis not present

## 2023-04-22 DIAGNOSIS — M542 Cervicalgia: Secondary | ICD-10-CM | POA: Diagnosis not present

## 2023-04-22 DIAGNOSIS — M9903 Segmental and somatic dysfunction of lumbar region: Secondary | ICD-10-CM | POA: Diagnosis not present

## 2023-05-06 DIAGNOSIS — M542 Cervicalgia: Secondary | ICD-10-CM | POA: Diagnosis not present

## 2023-05-06 DIAGNOSIS — M5416 Radiculopathy, lumbar region: Secondary | ICD-10-CM | POA: Diagnosis not present

## 2023-05-06 DIAGNOSIS — M9903 Segmental and somatic dysfunction of lumbar region: Secondary | ICD-10-CM | POA: Diagnosis not present

## 2023-05-06 DIAGNOSIS — M9901 Segmental and somatic dysfunction of cervical region: Secondary | ICD-10-CM | POA: Diagnosis not present

## 2023-05-16 ENCOUNTER — Telehealth: Payer: Self-pay | Admitting: Family Medicine

## 2023-05-16 ENCOUNTER — Encounter: Payer: Self-pay | Admitting: Family Medicine

## 2023-05-16 ENCOUNTER — Ambulatory Visit (INDEPENDENT_AMBULATORY_CARE_PROVIDER_SITE_OTHER): Payer: Medicare HMO | Admitting: Family Medicine

## 2023-05-16 VITALS — BP 134/84 | HR 75 | Temp 98.1°F | Ht 60.0 in | Wt 167.8 lb

## 2023-05-16 DIAGNOSIS — J309 Allergic rhinitis, unspecified: Secondary | ICD-10-CM

## 2023-05-16 DIAGNOSIS — E876 Hypokalemia: Secondary | ICD-10-CM | POA: Diagnosis not present

## 2023-05-16 DIAGNOSIS — G4733 Obstructive sleep apnea (adult) (pediatric): Secondary | ICD-10-CM | POA: Diagnosis not present

## 2023-05-16 MED ORDER — POTASSIUM CHLORIDE CRYS ER 20 MEQ PO TBCR: 20.0000 meq | EXTENDED_RELEASE_TABLET | Freq: Every day | ORAL | 0 refills | Status: DC

## 2023-05-16 NOTE — Assessment & Plan Note (Signed)
Chronic issue.  Seems to have improved with good air filters.  She can continue Claritin over-the-counter.  We will refer her to an allergist to get their input on whether or not testing for mold is needed.

## 2023-05-16 NOTE — Progress Notes (Signed)
Marikay Alar, MD Phone: [phone]  Cathy Coffey is a 82 y.o. female who presents today for f/u.  Mold exposure: Patient reports her granddaughter was diagnosed with mold toxicity recently and had a test through an herbalist that advised them of this.  Patient wonders if she should be tested for this.  She feels her immune system is not as good as it should be.  She does have allergic rhinitis symptoms with itchy eyes and sinus symptoms and rhinorrhea though those things have improved some with installing air filters in their house.  She also takes Claritin and ibuprofen which helps with her sinus symptoms.  They have treated the mold area as well.  OSA: Patient is back to using her CPAP nightly.  She wonders if the mask is not fitting all that well because sometimes her Fitbit tells her that she has not slept well.  Hypokalemia: Patient reports a history of low potassium back when she had COVID.  This was in 2020 or 2021.  She notes her heart stopped when she was in the hospital with this.  Since then she is eating a high potassium diet and taking over-the-counter potassium.  She is having trouble taking the amount of food required to meet daily potassium requirements.  Social History   Tobacco Use  Smoking Status Never  Smokeless Tobacco Never    Current Outpatient Medications on File Prior to Visit  Medication Sig Dispense Refill   alendronate (FOSAMAX) 70 MG tablet Take 70 mg by mouth once a week.     B Complex-C (SUPER B COMPLEX PO) Take 1 capsule by mouth.     Calcium Carbonate-Vitamin D (CALCIUM-VITAMIN D) 500-200 MG-UNIT per tablet Take 3 tablets by mouth daily.      levothyroxine (SYNTHROID) 100 MCG tablet TAKE 1 TABLET DAILY 90 tablet 3   Magnesium 250 MG TABS Take 400 mg by mouth.     Multiple Vitamins-Minerals (PRESERVISION AREDS PO) Take 1 tablet by mouth daily.      Omega-3 Fatty Acids (FISH OIL) 1000 MG CPDR Take 3,000 mg by mouth daily.      pantoprazole  (PROTONIX) 40 MG tablet TAKE 1 TABLET BY MOUTH EVERY DAY 90 tablet 3   Probiotic Product (TRUBIOTICS PO) Take 1 capsule by mouth.     TRAMADOL HCL ER PO Take by mouth.     No current facility-administered medications on file prior to visit.     ROS see history of present illness  Objective  Physical Exam Vitals:   05/16/23 1358  BP: 134/84  Pulse: 75  Temp: 98.1 F (36.7 C)  SpO2: 97%    BP Readings from Last 3 Encounters:  05/16/23 134/84  03/10/23 118/66  02/05/23 117/70   Wt Readings from Last 3 Encounters:  05/16/23 167 lb 12.8 oz (76.1 kg)  03/10/23 169 lb (76.7 kg)  12/31/22 163 lb 12.8 oz (74.3 kg)    Physical Exam Constitutional:      General: She is not in acute distress.    Appearance: She is not diaphoretic.  Pulmonary:     Effort: Pulmonary effort is normal.  Neurological:     Mental Status: She is alert.      Assessment/Plan: Please see individual problem list.  Hypokalemia Assessment & Plan: Patient reports a history of hypokalemia.  She has been taking over-the-counter potassium supplementation and eating a potassium rich diet.  She would like prescription for potassium supplementation so she can return back to a more normal diet.  We will start with 20 mill equivalents of potassium chloride daily.  We will have her return in a week or so to recheck her potassium level.  Discussed she should eat a healthy diet and stop the over-the-counter potassium supplementation.  Orders: -     Potassium Chloride Crys ER; Take 1 tablet (20 mEq total) by mouth daily.  Dispense: 30 tablet; Refill: 0 -     Basic metabolic panel; Future  Allergic rhinitis, unspecified seasonality, unspecified trigger Assessment & Plan: Chronic issue.  Seems to have improved with good air filters.  She can continue Claritin over-the-counter.  We will refer her to an allergist to get their input on whether or not testing for mold is needed.  Orders: -     Ambulatory referral to  Allergy  OSA (obstructive sleep apnea) Assessment & Plan: Chronic issue.  She will continue to use her CPAP.  We will request a compliance report and have somebody check with [employer] on getting a different mask for her to try.     Return in about 1 week (around 05/23/2023) for labs, 6 months PCP.   Marikay Alar, MD [facility]

## 2023-05-16 NOTE — Telephone Encounter (Signed)
Please call [employer] and see if we can get a compliance report on this patient's CPAP.  Can you also see if they can reach out to her regarding her CPAP mask not fitting well?  Thanks.

## 2023-05-16 NOTE — Assessment & Plan Note (Signed)
Chronic issue.  She will continue to use her CPAP.  We will request a compliance report and have somebody check with [employer] on getting a different mask for her to try.

## 2023-05-16 NOTE — Telephone Encounter (Signed)
Attempted to call [employer]. Recording stated that receptionist was unavailable to call back during regular business hours.

## 2023-05-16 NOTE — Assessment & Plan Note (Signed)
Patient reports a history of hypokalemia.  She has been taking over-the-counter potassium supplementation and eating a potassium rich diet.  She would like prescription for potassium supplementation so she can return back to a more normal diet.  We will start with 20 mill equivalents of potassium chloride daily.  We will have her return in a week or so to recheck her potassium level.  Discussed she should eat a healthy diet and stop the over-the-counter potassium supplementation.

## 2023-05-20 DIAGNOSIS — M5416 Radiculopathy, lumbar region: Secondary | ICD-10-CM | POA: Diagnosis not present

## 2023-05-20 DIAGNOSIS — M542 Cervicalgia: Secondary | ICD-10-CM | POA: Diagnosis not present

## 2023-05-20 DIAGNOSIS — M9903 Segmental and somatic dysfunction of lumbar region: Secondary | ICD-10-CM | POA: Diagnosis not present

## 2023-05-20 DIAGNOSIS — M9901 Segmental and somatic dysfunction of cervical region: Secondary | ICD-10-CM | POA: Diagnosis not present

## 2023-05-23 ENCOUNTER — Other Ambulatory Visit (INDEPENDENT_AMBULATORY_CARE_PROVIDER_SITE_OTHER): Payer: Medicare HMO

## 2023-05-23 ENCOUNTER — Telehealth: Payer: Self-pay | Admitting: Family Medicine

## 2023-05-23 DIAGNOSIS — E876 Hypokalemia: Secondary | ICD-10-CM | POA: Diagnosis not present

## 2023-05-23 LAB — BASIC METABOLIC PANEL
BUN: 21 mg/dL (ref 6–23)
CO2: 30 mEq/L (ref 19–32)
Calcium: 9.8 mg/dL (ref 8.4–10.5)
Chloride: 103 mEq/L (ref 96–112)
Creatinine, Ser: 0.88 mg/dL (ref 0.40–1.20)
GFR: 62.3 mL/min (ref >60.00)
Glucose, Bld: 97 mg/dL (ref 70–99)
Potassium: 4.8 mEq/L (ref 3.5–5.1)
Sodium: 139 mEq/L (ref 135–145)

## 2023-05-23 NOTE — Telephone Encounter (Signed)
I would suggest she take the protonix that has been sent in back in April. She could also try tums.

## 2023-05-23 NOTE — Telephone Encounter (Signed)
Patient states she needs something to coat her stomach. Patient states she has pain but not crampy pain and it is a 6/10. Patient states she has some bloating. Patient denies any other symptoms.

## 2023-05-23 NOTE — Telephone Encounter (Signed)
Please find out what stomach issues she is having? Is it pain? Nausea, vomiting, diarrhea, other symptoms?

## 2023-05-23 NOTE — Telephone Encounter (Signed)
Patient was just started potassium chloride SA (KLOR-CON M) 20 MEQ tablet last Satureday, 05/17/2023. She is having stomach issues , she is following  directions about taking medication. She wanted to know what she can take to stop her stomach from hurting and also stated she was not going to take another potassium pill.

## 2023-05-23 NOTE — Telephone Encounter (Signed)
Spoke to Patient regarding Dr. Purvis Sheffield recommendation and she is agreeable.

## 2023-05-27 ENCOUNTER — Telehealth: Payer: Self-pay | Admitting: *Deleted

## 2023-05-27 NOTE — Telephone Encounter (Signed)
-----   Message from Marikay Alar sent at 05/26/2023  4:55 PM EDT ----- Please let the patient know her potassium is acceptable.  Is she still taking potassium supplementation?

## 2023-05-27 NOTE — Telephone Encounter (Signed)
Left voicemail to return call. 

## 2023-05-28 NOTE — Telephone Encounter (Signed)
Patient states she is returning our call.  I read message from Dr. Marikay Alar to patient.  Patient states she is no longer taking a potassium supplement. Patient states she went back to her last regimen.  Patient states Dr. Birdie Sons will know what this means.

## 2023-05-28 NOTE — Telephone Encounter (Signed)
Patient called. She got the CPAP machine about 5 to 7 years ago from the medical supply store across from [facility] on [address], she doesn't remember the name.  Here is the following information from her CPAP Machine;   [employer] S9  Serial # [id number] Ref # [id number]   There are two pieces to machine, the 2nd piece has the following;  85 I  Serial # [id number] Ref # [phone]

## 2023-05-28 NOTE — Telephone Encounter (Signed)
Noted. We will continue to monitor her potassium level on her prior regimen.

## 2023-05-28 NOTE — Telephone Encounter (Signed)
Spoke with [employer], pt not in their system. I called pt to find out who I need to contact. Pt not at home wil call back with name and number when she gets back home.

## 2023-05-29 NOTE — Telephone Encounter (Signed)
Noted. Please relay this to the patient. I would suggest we get another sleep study so we can see if her setting for her machine should change or if she needs a different mask. I can place an order for this once you speak with her.

## 2023-05-29 NOTE — Telephone Encounter (Signed)
Called the company and they state the Patient has not seen a provider at there office and she received her CPAP machine in 2018. They can not pull a compliance report on this machine.

## 2023-05-29 NOTE — Telephone Encounter (Signed)
Spoke to Patient she is agreeable to get another sleep study.

## 2023-05-29 NOTE — Progress Notes (Signed)
This has been handled please see phone note

## 2023-05-30 NOTE — Addendum Note (Signed)
Addended by: Glori Luis on: 05/30/2023 10:27 AM   Modules accepted: Orders

## 2023-05-30 NOTE — Telephone Encounter (Signed)
Order placed

## 2023-06-07 ENCOUNTER — Other Ambulatory Visit: Payer: Self-pay | Admitting: Family Medicine

## 2023-06-07 DIAGNOSIS — E876 Hypokalemia: Secondary | ICD-10-CM

## 2023-06-09 NOTE — Telephone Encounter (Signed)
Patient previously noted to [name] that she was not taking this. Please make sure this is correct. If she is not taking it it can be declined.

## 2023-06-25 DIAGNOSIS — R0602 Shortness of breath: Secondary | ICD-10-CM | POA: Diagnosis not present

## 2023-06-25 DIAGNOSIS — G4733 Obstructive sleep apnea (adult) (pediatric): Secondary | ICD-10-CM | POA: Diagnosis not present

## 2023-06-28 DIAGNOSIS — R0602 Shortness of breath: Secondary | ICD-10-CM | POA: Diagnosis not present

## 2023-06-28 DIAGNOSIS — G4733 Obstructive sleep apnea (adult) (pediatric): Secondary | ICD-10-CM | POA: Diagnosis not present

## 2023-07-11 ENCOUNTER — Telehealth: Payer: Self-pay | Admitting: Family Medicine

## 2023-07-11 NOTE — Telephone Encounter (Signed)
Please let the patient know that her sleep study came back with sleep apnea.  I am signing orders for new CPAP and mask.

## 2023-07-15 NOTE — Telephone Encounter (Signed)
Called Patient and she is aware and thankful for the new CPAP and mask.

## 2023-07-19 ENCOUNTER — Other Ambulatory Visit: Payer: Self-pay | Admitting: Family Medicine

## 2023-07-28 DIAGNOSIS — G4733 Obstructive sleep apnea (adult) (pediatric): Secondary | ICD-10-CM | POA: Diagnosis not present

## 2023-08-27 DIAGNOSIS — G4733 Obstructive sleep apnea (adult) (pediatric): Secondary | ICD-10-CM | POA: Diagnosis not present

## 2023-08-28 DIAGNOSIS — M9903 Segmental and somatic dysfunction of lumbar region: Secondary | ICD-10-CM | POA: Diagnosis not present

## 2023-08-28 DIAGNOSIS — M542 Cervicalgia: Secondary | ICD-10-CM | POA: Diagnosis not present

## 2023-08-28 DIAGNOSIS — M9901 Segmental and somatic dysfunction of cervical region: Secondary | ICD-10-CM | POA: Diagnosis not present

## 2023-08-28 DIAGNOSIS — M5416 Radiculopathy, lumbar region: Secondary | ICD-10-CM | POA: Diagnosis not present

## 2023-09-09 ENCOUNTER — Encounter: Payer: Self-pay | Admitting: Family Medicine

## 2023-09-09 ENCOUNTER — Ambulatory Visit (INDEPENDENT_AMBULATORY_CARE_PROVIDER_SITE_OTHER): Payer: Medicare HMO | Admitting: Family Medicine

## 2023-09-09 ENCOUNTER — Telehealth: Payer: Self-pay | Admitting: Family Medicine

## 2023-09-09 VITALS — BP 118/76 | HR 74 | Temp 98.2°F | Ht 60.0 in | Wt 162.0 lb

## 2023-09-09 DIAGNOSIS — G4733 Obstructive sleep apnea (adult) (pediatric): Secondary | ICD-10-CM

## 2023-09-09 DIAGNOSIS — R21 Rash and other nonspecific skin eruption: Secondary | ICD-10-CM | POA: Diagnosis not present

## 2023-09-09 DIAGNOSIS — F439 Reaction to severe stress, unspecified: Secondary | ICD-10-CM | POA: Insufficient documentation

## 2023-09-09 NOTE — Progress Notes (Signed)
Marikay Alar, MD Phone: [phone]  Cathy Coffey is a 82 y.o. female who presents today for follow-up.  OSA: Patient notes the CPAP seems to have been beneficial.  She is waking up better rested in the morning.  She wears it for 7 to 8 hours at night.  Notes sometimes she has trouble sleeping due to thinking about her granddaughter.  She also has had somewhat of a difficult time keeping the gear on her head though she has been using air pins to keep it in place.  Genital rash: Patient notes for the last year or so she has had a rash on her external genitals.  She notes they have become shrunken and hard.  Notes it has progressively worsened.  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  She has been using aloe on this.  Notes it was itching and rough previously.  Patient notes she is also worried about her granddaughter.  Her granddaughter has mental health issues and has PTSD.  She wonders who a good psychiatrist may be in the area.  Social History   Tobacco Use  Smoking Status Never  Smokeless Tobacco Never    Current Outpatient Medications on File Prior to Visit  Medication Sig Dispense Refill   alendronate (FOSAMAX) 70 MG tablet Take 70 mg by mouth once a week.     B Complex-C (SUPER B COMPLEX PO) Take 1 capsule by mouth.     Calcium Carbonate-Vitamin D (CALCIUM-VITAMIN D) 500-200 MG-UNIT per tablet Take 3 tablets by mouth daily.      levothyroxine (SYNTHROID) 100 MCG tablet TAKE 1 TABLET DAILY 90 tablet 3   Magnesium 250 MG TABS Take 400 mg by mouth.     Multiple Vitamins-Minerals (PRESERVISION AREDS PO) Take 1 tablet by mouth daily.      Omega-3 Fatty Acids (FISH OIL) 1000 MG CPDR Take 3,000 mg by mouth daily.      pantoprazole (PROTONIX) 40 MG tablet TAKE 1 TABLET BY MOUTH EVERY DAY 90 tablet 3   potassium chloride SA (KLOR-CON M) 20 MEQ tablet Take 1 tablet (20 mEq total) by mouth daily. 30 tablet 0   Probiotic Product  (TRUBIOTICS PO) Take 1 capsule by mouth.     TRAMADOL HCL ER PO Take by mouth.     No current facility-administered medications on file prior to visit.     ROS see history of present illness  Objective  Physical Exam Vitals:   09/09/23 0952  BP: 118/76  Pulse: 74  Temp: 98.2 F (36.8 C)  SpO2: 96%    BP Readings from Last 3 Encounters:  09/09/23 118/76  05/16/23 134/84  03/10/23 118/66   Wt Readings from Last 3 Encounters:  09/09/23 162 lb (73.5 kg)  05/16/23 167 lb 12.8 oz (76.1 kg)  03/10/23 169 lb (76.7 kg)    Physical Exam Constitutional:      General: She is not in acute distress.    Appearance: She is not diaphoretic.  Cardiovascular:     Rate and Rhythm: Normal rate and regular rhythm.     Heart sounds: Normal heart sounds.  Pulmonary:     Effort: Pulmonary effort is normal.     Breath sounds: Normal breath sounds.  Genitourinary:    Comments: Dennie Bible, CMA served as chaperone, Vernona Rieger with no rash, labia minora with no rash though they are firm and seem to be smaller than they should be Skin:  General: Skin is warm and dry.  Neurological:     Mental Status: She is alert.      Assessment/Plan: Please see individual problem list.  OSA (obstructive sleep apnea) Assessment & Plan: Chronic issue.  Seems to be benefiting from her new CPAP.  We will contact [employer] to get a compliance report.  Patient will continue her CPAP.  Advised patient we would contact her if her compliance report revealed her CPAP was not working adequately.   Rash of genital area Assessment & Plan: Chronic issue.  Undetermined cause.  Discussed that this could be lichen sclerosis that she does not have rash that would be typical of that.  This could be related to her lichen planus though also there is no rash at this time.  Will refer to GYN to evaluate further and determine appropriate treatment.  Orders: -     Ambulatory referral to Gynecology  Stress Assessment &  Plan: Offered support.  Discussed Dr. Maryruth Bun is a good psychiatrist in the area though I am not sure of the age of patients that she sees.      Return in about 6 months (around 03/09/2024) for Transfer of care.   Marikay Alar, MD [facility]

## 2023-09-09 NOTE — Assessment & Plan Note (Signed)
Offered support.  Discussed Dr. Maryruth Bun is a good psychiatrist in the area though I am not sure of the age of patients that she sees.

## 2023-09-09 NOTE — Telephone Encounter (Signed)
Please contact [employer] and get a compliance report for this patient.  Thanks.

## 2023-09-09 NOTE — Patient Instructions (Signed)
Dr Maryruth Bun is a psychiatrist in the area.

## 2023-09-09 NOTE — Assessment & Plan Note (Addendum)
Chronic issue.  Undetermined cause.  Discussed that this could be lichen sclerosis that she does not have rash that would be typical of that.  This could be related to her lichen planus though also there is no rash at this time.  Will refer to GYN to evaluate further and determine appropriate treatment.

## 2023-09-09 NOTE — Assessment & Plan Note (Addendum)
Chronic issue.  Seems to be benefiting from her new CPAP.  We will contact [employer] to get a compliance report.  Patient will continue her CPAP.  Advised patient we would contact her if her compliance report revealed her CPAP was not working adequately.

## 2023-09-11 DIAGNOSIS — M5416 Radiculopathy, lumbar region: Secondary | ICD-10-CM | POA: Diagnosis not present

## 2023-09-11 DIAGNOSIS — M9903 Segmental and somatic dysfunction of lumbar region: Secondary | ICD-10-CM | POA: Diagnosis not present

## 2023-09-11 DIAGNOSIS — M9901 Segmental and somatic dysfunction of cervical region: Secondary | ICD-10-CM | POA: Diagnosis not present

## 2023-09-11 DIAGNOSIS — M542 Cervicalgia: Secondary | ICD-10-CM | POA: Diagnosis not present

## 2023-09-11 NOTE — Telephone Encounter (Signed)
Noted. Can you figure our what Bing Ree is and see how we get access to it? You may need to ask Morrie Sheldon.

## 2023-09-12 NOTE — Telephone Encounter (Signed)
Mychart message sent.

## 2023-09-12 NOTE — Telephone Encounter (Signed)
Please let the patient know that her CPAP is working adequately when she does wear it.  She does need to try to wear it on a nightly basis as it appears she only used it in 45 out of the past 61 days.

## 2023-09-15 DIAGNOSIS — F0781 Postconcussional syndrome: Secondary | ICD-10-CM | POA: Diagnosis not present

## 2023-09-15 DIAGNOSIS — G3184 Mild cognitive impairment, so stated: Secondary | ICD-10-CM | POA: Diagnosis not present

## 2023-09-16 ENCOUNTER — Ambulatory Visit: Payer: Medicare HMO | Admitting: Obstetrics & Gynecology

## 2023-09-16 VITALS — BP 122/72 | HR 60 | Wt 162.7 lb

## 2023-09-16 DIAGNOSIS — L989 Disorder of the skin and subcutaneous tissue, unspecified: Secondary | ICD-10-CM | POA: Diagnosis not present

## 2023-09-16 DIAGNOSIS — Z1231 Encounter for screening mammogram for malignant neoplasm of breast: Secondary | ICD-10-CM

## 2023-09-16 MED ORDER — CLOBETASOL PROPIONATE 0.05 % EX OINT: TOPICAL_OINTMENT | CUTANEOUS | 5 refills | Status: DC

## 2023-09-16 NOTE — Progress Notes (Signed)
    GYNECOLOGY PROGRESS NOTE  Subjective:    Patient ID: MAKENA MURDOCK, female    DOB: Aug 12, 1941, 82 y.o.   MRN: [id number]  HPI  Patient is a 82 y.o. divorced  here because she noticed that her labia are shrinking and becoming "hard", no longer "soft". She noticed this about a year ago. She has been abstinent for about 20 years. She thinks that she was seen here about 44 years ago (for the birth of her last child).  She has a h/o "lichen planus" and stopped using her streroid cream "5 or 10 years ago". The "hardening" has progressed since then.  The following portions of the patient's history were reviewed and updated as appropriate: allergies, current medications, past family history, past medical history, past social history, past surgical history, and problem list.  Review of Systems Pertinent items are noted in HPI.   Objective:   Blood pressure 122/72, pulse 60, weight 162 lb 11.2 oz (73.8 kg). Body mass index is 31.78 kg/m. Well nourished, well hydrated White female, no apparent distress She is ambulating and conversing normally. EG- paucity of hair and atrophy as expected The labia majora are norma for her age. The labia minora, clitoral hood are all firmer than would be expected. There are some skin changes c/w lichen.  Speculum exam normal. Bimanual exam reveals no masses or tenderness  Assessment:   Skin changes/firmness of labia minora/clitoral hood   Plan:  I have rec'd that she restart temovate cream nightly. If the firmness has not resolved in a month, then she will go see gyn onc for second opinion Mammogram ordered

## 2023-09-27 DIAGNOSIS — G4733 Obstructive sleep apnea (adult) (pediatric): Secondary | ICD-10-CM | POA: Diagnosis not present

## 2023-10-07 ENCOUNTER — Encounter: Payer: Self-pay | Admitting: Family Medicine

## 2023-10-07 ENCOUNTER — Ambulatory Visit: Payer: Medicare HMO | Admitting: Family Medicine

## 2023-10-07 VITALS — BP 118/74 | HR 66 | Temp 98.0°F | Ht 60.0 in | Wt 162.8 lb

## 2023-10-07 DIAGNOSIS — R350 Frequency of micturition: Secondary | ICD-10-CM | POA: Diagnosis not present

## 2023-10-07 HISTORY — DX: Frequency of micturition: R35.0

## 2023-10-07 LAB — POC URINALSYSI DIPSTICK (AUTOMATED)
Bilirubin, UA: NEGATIVE
Blood, UA: NEGATIVE
Glucose, UA: NEGATIVE
Ketones, UA: NEGATIVE
Nitrite, UA: NEGATIVE
Protein, UA: NEGATIVE
Spec Grav, UA: 1.02 (ref 1.010–1.025)
Urobilinogen, UA: 0.2 U/dL
pH, UA: 7 (ref 5.0–8.0)

## 2023-10-07 LAB — URINALYSIS, MICROSCOPIC ONLY

## 2023-10-07 MED ORDER — NITROFURANTOIN MONOHYD MACRO 100 MG PO CAPS: 100.0000 mg | ORAL_CAPSULE | Freq: Two times a day (BID) | ORAL | 0 refills | Status: DC

## 2023-10-07 NOTE — Patient Instructions (Signed)
Nice to see you. We we will treat you for a UTI.  If your symptoms are not improving over the next several days please let us know.  Will contact you when your urine culture returns.

## 2023-10-07 NOTE — Progress Notes (Signed)
Marikay Alar, MD Phone: [phone]  Cathy Coffey is a 82 y.o. female who presents today for same day visit.   Urine frequency: Onset 3 days ago.  Has pressure when she urinates.  No dysuria.  Has urinary frequency and urgency.  Notes some hematuria with small clots noted.  No vaginal discharge.  Has not had a UTI in quite some time.  Social History   Tobacco Use  Smoking Status Never  Smokeless Tobacco Never    Current Outpatient Medications on File Prior to Visit  Medication Sig Dispense Refill   alendronate (FOSAMAX) 70 MG tablet Take 70 mg by mouth once a week. (Patient not taking: Reported on 09/16/2023)     B Complex-C (SUPER B COMPLEX PO) Take 1 capsule by mouth.     Calcium Carbonate-Vitamin D (CALCIUM-VITAMIN D) 500-200 MG-UNIT per tablet Take 3 tablets by mouth daily.      clobetasol ointment (TEMOVATE) 0.05 % Apply to affected area every night for 4 weeks, then every other day for 4 weeks and then twice a week for 4 weeks or until resolution. 30 g 5   levothyroxine (SYNTHROID) 100 MCG tablet TAKE 1 TABLET DAILY 90 tablet 3   Magnesium 250 MG TABS Take 400 mg by mouth.     Multiple Vitamins-Minerals (PRESERVISION AREDS PO) Take 1 tablet by mouth daily.      Omega-3 Fatty Acids (FISH OIL) 1000 MG CPDR Take 3,000 mg by mouth daily.      pantoprazole (PROTONIX) 40 MG tablet TAKE 1 TABLET BY MOUTH EVERY DAY 90 tablet 3   potassium chloride SA (KLOR-CON M) 20 MEQ tablet Take 1 tablet (20 mEq total) by mouth daily. (Patient not taking: Reported on 09/16/2023) 30 tablet 0   Probiotic Product (TRUBIOTICS PO) Take 1 capsule by mouth.     TRAMADOL HCL ER PO Take by mouth.     No current facility-administered medications on file prior to visit.     ROS see history of present illness  Objective  Physical Exam Vitals:   10/07/23 0823  BP: 118/74  Pulse: 66  Temp: 98 F (36.7 C)  SpO2: 97%    BP Readings from Last 3 Encounters:  10/07/23 118/74  09/16/23 122/72   09/09/23 118/76   Wt Readings from Last 3 Encounters:  10/07/23 162 lb 12.8 oz (73.8 kg)  09/16/23 162 lb 11.2 oz (73.8 kg)  09/09/23 162 lb (73.5 kg)    Physical Exam Constitutional:      General: She is not in acute distress.    Appearance: She is not diaphoretic.  Cardiovascular:     Rate and Rhythm: Normal rate.  Pulmonary:     Effort: Pulmonary effort is normal.  Abdominal:     General: Bowel sounds are normal. There is no distension.     Palpations: Abdomen is soft.     Tenderness: There is no abdominal tenderness.  Skin:    General: Skin is warm and dry.  Neurological:     Mental Status: She is alert.      Assessment/Plan: Please see individual problem list.  Urine frequency Assessment & Plan: Suspect related to UTI given her symptoms.  Urinalysis with leukocytes.  Will treat for UTI with Macrobid 100 mg twice daily for 7 days.  Will send urine for culture and microscopy.  If symptoms are not improving she will let us know.  Orders: -     POCT Urinalysis Dipstick (Automated) -     Urine Culture -  Urine Microscopic -     Nitrofurantoin Monohyd Macro; Take 1 capsule (100 mg total) by mouth 2 (two) times daily.  Dispense: 14 capsule; Refill: 0     Return if symptoms worsen or fail to improve.   Marikay Alar, MD [facility]

## 2023-10-07 NOTE — Assessment & Plan Note (Addendum)
Suspect related to UTI given her symptoms.  Urinalysis with leukocytes.  Will treat for UTI with Macrobid 100 mg twice daily for 7 days.  Will send urine for culture and microscopy.  If symptoms are not improving she will let us know.

## 2023-10-10 LAB — URINE CULTURE
MICRO NUMBER:: 15782018
SPECIMEN QUALITY:: ADEQUATE

## 2023-10-11 ENCOUNTER — Other Ambulatory Visit: Payer: Self-pay | Admitting: Family Medicine

## 2023-10-11 DIAGNOSIS — N3001 Acute cystitis with hematuria: Secondary | ICD-10-CM

## 2023-10-11 MED ORDER — SULFAMETHOXAZOLE-TRIMETHOPRIM 800-160 MG PO TABS: 1.0000 | ORAL_TABLET | Freq: Two times a day (BID) | ORAL | 0 refills | Status: DC

## 2023-10-27 DIAGNOSIS — G4733 Obstructive sleep apnea (adult) (pediatric): Secondary | ICD-10-CM | POA: Diagnosis not present

## 2023-10-28 DIAGNOSIS — H40023 Open angle with borderline findings, high risk, bilateral: Secondary | ICD-10-CM | POA: Diagnosis not present

## 2023-10-28 DIAGNOSIS — H35363 Drusen (degenerative) of macula, bilateral: Secondary | ICD-10-CM | POA: Diagnosis not present

## 2023-10-28 DIAGNOSIS — H5203 Hypermetropia, bilateral: Secondary | ICD-10-CM | POA: Diagnosis not present

## 2023-10-28 DIAGNOSIS — G4733 Obstructive sleep apnea (adult) (pediatric): Secondary | ICD-10-CM | POA: Diagnosis not present

## 2023-10-28 DIAGNOSIS — Z0100 Encounter for examination of eyes and vision without abnormal findings: Secondary | ICD-10-CM | POA: Diagnosis not present

## 2023-10-28 DIAGNOSIS — H35373 Puckering of macula, bilateral: Secondary | ICD-10-CM | POA: Diagnosis not present

## 2023-11-17 ENCOUNTER — Ambulatory Visit: Payer: Medicare HMO | Admitting: Family Medicine

## 2023-11-27 DIAGNOSIS — G4733 Obstructive sleep apnea (adult) (pediatric): Secondary | ICD-10-CM | POA: Diagnosis not present

## 2023-12-05 ENCOUNTER — Ambulatory Visit: Payer: Medicare HMO | Admitting: Family Medicine

## 2023-12-08 ENCOUNTER — Telehealth: Payer: Self-pay | Admitting: Family Medicine

## 2023-12-08 NOTE — Telephone Encounter (Signed)
Noted. Forwarding to Claris Che so she is aware the patient should have follow-up urine testing when she see's the patient on 2/3.

## 2023-12-08 NOTE — Telephone Encounter (Signed)
Called pt and she stated that her insurance got messed up and she rescheduled until the first week in Feb.

## 2023-12-08 NOTE — Telephone Encounter (Signed)
This patient was supposed to have a repeat urine test in earlier January though it looks like she was not scheduled to have this done. Can we get her scheduled for this?

## 2023-12-15 ENCOUNTER — Telehealth: Payer: Self-pay | Admitting: Family Medicine

## 2023-12-15 ENCOUNTER — Encounter: Payer: Self-pay | Admitting: Family

## 2023-12-15 ENCOUNTER — Ambulatory Visit: Payer: PPO | Admitting: Family

## 2023-12-15 VITALS — BP 120/76 | HR 78 | Temp 98.2°F | Ht 61.0 in | Wt 164.0 lb

## 2023-12-15 DIAGNOSIS — R933 Abnormal findings on diagnostic imaging of other parts of digestive tract: Secondary | ICD-10-CM

## 2023-12-15 DIAGNOSIS — R319 Hematuria, unspecified: Secondary | ICD-10-CM | POA: Diagnosis not present

## 2023-12-15 DIAGNOSIS — M545 Low back pain, unspecified: Secondary | ICD-10-CM

## 2023-12-15 DIAGNOSIS — G8929 Other chronic pain: Secondary | ICD-10-CM

## 2023-12-15 DIAGNOSIS — R350 Frequency of micturition: Secondary | ICD-10-CM

## 2023-12-15 LAB — URINALYSIS, ROUTINE W REFLEX MICROSCOPIC
Bilirubin Urine: NEGATIVE
Hgb urine dipstick: NEGATIVE
Ketones, ur: NEGATIVE
Leukocytes,Ua: NEGATIVE
Nitrite: NEGATIVE
RBC / HPF: NONE SEEN (ref 0–?)
Specific Gravity, Urine: <=1.005 — AB (ref 1.000–1.030)
Total Protein, Urine: NEGATIVE
Urine Glucose: NEGATIVE
Urobilinogen, UA: 0.2 (ref 0.0–1.0)
WBC, UA: NONE SEEN (ref 0–?)
pH: 7 (ref 5.0–8.0)

## 2023-12-15 LAB — POCT URINALYSIS DIPSTICK
Bilirubin, UA: NEGATIVE
Blood, UA: NEGATIVE
Glucose, UA: NEGATIVE
Ketones, UA: NEGATIVE
Leukocytes, UA: NEGATIVE
Nitrite, UA: NEGATIVE
Protein, UA: NEGATIVE
Spec Grav, UA: 1.01 (ref 1.010–1.025)
Urobilinogen, UA: 0.2 U/dL
pH, UA: 7 (ref 5.0–8.0)

## 2023-12-15 NOTE — Progress Notes (Unsigned)
Assessment & Plan:  Hematuria, unspecified type -     POCT urinalysis dipstick -     Urinalysis, Routine w reflex microscopic -     Urine Culture  Abnormal CT scan, colon     Return precautions given.   Risks, benefits, and alternatives of the medications and treatment plan prescribed today were discussed, and patient expressed understanding.   Education regarding symptom management and diagnosis given to patient on AVS either electronically or printed.  No follow-ups on file.  Rennie Plowman, FNP  Subjective:    Patient ID: Cathy Coffey, female    DOB: 04/06/1941, 83 y.o.   MRN: [id number]  CC: Cathy Coffey is a 83 y.o. female who presents today for follow up.   HPI: She is here today for follow up hematuria.   Denies dysuria, hematuria.   Drinking a lot of water.   She has noticed more middle  back pain of late.  NO numbness,  saddle anesthesia ,urinary or fecal incontinence.   She has started exercise program.       She is takng tumeric and fresh ginger for low back pain. She keeps a prescription of tramadol at home if she was to 'overdue'. She doesn't take tramadol , ibuprofen regularly.    Urine culture serratia marcescens 09/27/23 UA  WBC 11-20, RBC 3-6. Seen 10/07/2023 for urinary frequency.  Started on Macrobid.  This was changed and she was started on Bactrim 10/11/2023 Gastroenterology CT abdomen and pelvis 05/01/2022 1. Segment of proximal transverse colon appears to demonstrate wall thickening and luminal narrowing compared to the colon more proximally and distally. Differentials include peristalsis, infectious/inflammatory colitis, or possibly neoplasm. Correlate with any recent colonoscopy, or consider colonoscopy or short-term repeat CT again with oral contrast. 2. 11 mm complex cystic lesion in the posterior right kidney. Consider follow-up MRI abdomen with contrast. 3. Colonic diverticulosis.  Consult Dr. Tobi Bastos on 07/30/2022,  colonoscopy reviewed for luminal narrowing.  Suspect abdominal pain due to constipation.  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   Allergies: Moxifloxacin, Augmentin [amoxicillin-pot clavulanate], and Doxycycline Current Outpatient Medications on File Prior to Visit  Medication Sig Dispense Refill   alendronate (FOSAMAX) 70 MG tablet Take 70 mg by mouth once a week.     B Complex-C (SUPER B COMPLEX PO) Take 1 capsule by mouth.     Calcium Carbonate-Vitamin D (CALCIUM-VITAMIN D) 500-200 MG-UNIT per tablet Take 3 tablets by mouth daily.      levothyroxine (SYNTHROID) 100 MCG tablet TAKE 1 TABLET DAILY 90 tablet 3   Magnesium 250 MG TABS Take 400 mg by mouth.     Multiple Vitamins-Minerals (PRESERVISION AREDS PO) Take 1 tablet by mouth daily.      nitrofurantoin, macrocrystal-monohydrate, (MACROBID) 100 MG capsule Take 1 capsule (100 mg total) by mouth 2 (two) times daily. 14 capsule 0   Omega-3 Fatty Acids (FISH OIL) 1000 MG CPDR Take 3,000 mg by mouth daily.      pantoprazole (PROTONIX) 40 MG tablet TAKE 1 TABLET BY MOUTH EVERY DAY 90 tablet 3   potassium chloride SA (KLOR-CON M) 20 MEQ tablet Take 1 tablet (20 mEq total) by mouth daily. 30 tablet 0   Probiotic Product (TRUBIOTICS PO) Take 1 capsule by mouth.  sulfamethoxazole-trimethoprim (BACTRIM DS) 800-160 MG tablet Take 1 tablet by mouth 2 (two) times daily. 6 tablet 0   TRAMADOL HCL ER PO Take by mouth.     clobetasol ointment (TEMOVATE) 0.05 % Apply to affected area every night for 4 weeks, then every other day for 4 weeks and then twice a week for 4 weeks or until resolution. 30 g 5   No current facility-administered medications on file prior to visit.     Review of Systems    Objective:    There were no vitals taken for this visit. BP Readings from Last 3 Encounters:  10/07/23 118/74  09/16/23 122/72  09/09/23 118/76   Wt Readings from Last 3 Encounters:  10/07/23 162 lb 12.8 oz (73.8 kg)  09/16/23 162 lb 11.2 oz (73.8 kg)  09/09/23 162 lb (73.5 kg)    Physical Exam

## 2023-12-15 NOTE — Telephone Encounter (Signed)
Copied from CRM [id number]. Topic: Clinical - Medical Advice >> Dec 15, 2023  2:17 PM Shelbie Proctor wrote: Reason for CRM: Patient [phone] was in the office today and would like to started the antibiotics instead of waiting for test results in 2 days. Patient states NP, Rennie Plowman knows what medication to prescribe. Please advise and call back.   [facility] [address] [place], [place] - [address] [place] [address] [phone]:[phone]

## 2023-12-16 LAB — URINE CULTURE
MICRO NUMBER:: 16032853
Result:: NO GROWTH
SPECIMEN QUALITY:: ADEQUATE

## 2023-12-16 MED ORDER — SULFAMETHOXAZOLE-TRIMETHOPRIM 800-160 MG PO TABS: 1.0000 | ORAL_TABLET | Freq: Two times a day (BID) | ORAL | 0 refills | Status: DC

## 2023-12-16 NOTE — Telephone Encounter (Signed)
 Call pt  I prescribed bactrim , antibiotic, which she had  been on 09/2023.   Ensure to take probiotics while on antibiotics and also for 2 weeks after completion. This can either be by eating yogurt daily or taking a probiotic supplement over the counter such as Culturelle.It is important to re-colonize the gut with good bacteria and also to prevent any diarrheal infections associated with antibiotic use.   Urine cuture pending at this time

## 2023-12-16 NOTE — Telephone Encounter (Signed)
 Spoke to pt and informed her that you prescribed bactrim , antibiotic, which she had  been on 09/2023.    Ensure to take probiotics while on antibiotics and also for 2 weeks after completion. This can either be by eating yogurt daily or taking a probiotic supplement over the counter such as Culturelle.It is important to re-colonize the gut with good bacteria and also to prevent any diarrheal infections associated with antibiotic use.    Urine cuture pending at this time Pt verbalized understanding

## 2023-12-16 NOTE — Addendum Note (Signed)
Addended by: Allegra Grana on: 12/16/2023 08:10 AM   Modules accepted: Orders

## 2023-12-17 ENCOUNTER — Encounter: Payer: Self-pay | Admitting: Family

## 2023-12-17 ENCOUNTER — Telehealth: Payer: Self-pay | Admitting: Family Medicine

## 2023-12-17 NOTE — Telephone Encounter (Signed)
 Please let the patient know that I got a fax from [facility] that her levothyroxine  was part of the recent recall.  She needs to contact her pharmacy to get a replacement supply.  Thanks.

## 2023-12-18 DIAGNOSIS — R319 Hematuria, unspecified: Secondary | ICD-10-CM

## 2023-12-18 HISTORY — DX: Hematuria, unspecified: R31.9

## 2023-12-18 NOTE — Assessment & Plan Note (Signed)
 Symptoms resolved.  Pending urine culture, urinalysis

## 2023-12-18 NOTE — Telephone Encounter (Signed)
 Pt.notified

## 2023-12-18 NOTE — Assessment & Plan Note (Signed)
 Chronic, stable.  She is doing quite well on turmeric, ginger.  Rare use of NSAIDs, tramadol .  Politely declines further evaluation at this time.  Will monitor

## 2023-12-19 ENCOUNTER — Other Ambulatory Visit: Payer: Self-pay | Admitting: Family Medicine

## 2023-12-19 ENCOUNTER — Ambulatory Visit (INDEPENDENT_AMBULATORY_CARE_PROVIDER_SITE_OTHER): Payer: PPO | Admitting: *Deleted

## 2023-12-19 VITALS — Ht 61.0 in | Wt 160.0 lb

## 2023-12-19 DIAGNOSIS — Z0000 Encounter for general adult medical examination without abnormal findings: Secondary | ICD-10-CM

## 2023-12-19 MED ORDER — LEVOTHYROXINE SODIUM 100 MCG PO TABS: 100.0000 ug | ORAL_TABLET | Freq: Every day | ORAL | 0 refills | Status: DC

## 2023-12-19 NOTE — Telephone Encounter (Signed)
 Copied from CRM [id number]. Topic: Clinical - Medication Refill >> Dec 19, 2023  9:26 AM Viola FALCON wrote: Most Recent Primary Care Visit:  Provider: DINEEN ROLLENE MATSU  Department: [facility]  Visit Type: OFFICE VISIT  Date: 12/15/2023  Medication: Levothyroxine    Has the patient contacted their pharmacy? Yes, patient says it was recalled and would like a new prescription   (Agent: If no, request that the patient contact the pharmacy for the refill. If patient does not wish to contact the pharmacy document the reason why and proceed with request.) (Agent: If yes, when and what did the pharmacy advise?)  Is this the correct pharmacy for this prescription? Yes If no, delete pharmacy and type the correct one.  This is the patient's preferred pharmacy:   [id number] [place], [place] - [address] [place] [address] Phone: [phone] Fax: [phone]   Has the prescription been filled recently? Yes  Is the patient out of the medication? No  Has the patient been seen for an appointment in the last year OR does the patient have an upcoming appointment? Yes  Can we respond through MyChart? Yes  Agent: Please be advised that Rx refills may take up to 3 business days. We ask that you follow-up with your pharmacy.

## 2023-12-19 NOTE — Progress Notes (Signed)
 Subjective:   Cathy Coffey is a 83 y.o. female who presents for Medicare Annual (Subsequent) preventive examination.  Visit Complete: Virtual I connected with  Carolan C Gray on 12/19/23 by a audio enabled telemedicine application and verified that I am speaking with the correct person using two identifiers. This patient declined Interactive audio and acupuncturist. Therefore the visit was completed with audio only.   Patient Location: Home  Provider Location: Home Office  I discussed the limitations of evaluation and management by telemedicine. The patient expressed understanding and agreed to proceed.  Vital Signs: Because this visit was a virtual/telehealth visit, some criteria may be missing or patient reported. Any vitals not documented were not able to be obtained and vitals that have been documented are patient reported.   Cardiac Risk Factors include: advanced age (>83men, >44 women);dyslipidemia;hypertension;obesity (BMI >30kg/m2)     Objective:    Today's Vitals   12/19/23 0840  Weight: 160 lb (72.6 kg)  Height: 5' 1 (1.549 m)   Body mass index is 30.23 kg/m.     12/19/2023    8:57 AM 11/15/2022    9:37 AM 08/31/2022   10:29 AM 06/13/2022   10:17 AM 05/29/2022   10:25 AM 11/07/2021   12:49 PM 10/09/2021   11:52 AM  Advanced Directives  Does Patient Have a Medical Advance Directive? No Yes No No No No No  Type of Advance Directive  Healthcare Power of Attorney       Does patient want to make changes to medical advance directive?  No - Patient declined       Copy of Healthcare Power of Attorney in Chart?  No - copy requested       Would patient like information on creating a medical advance directive? No - Patient declined     No - Patient declined No - Patient declined    Current Medications (verified) Outpatient Encounter Medications as of 12/19/2023  Medication Sig   B Complex-C (SUPER B COMPLEX PO) Take 1 capsule by mouth.   Calcium   Carbonate-Vitamin D  (CALCIUM -VITAMIN D ) 500-200 MG-UNIT per tablet Take 3 tablets by mouth daily.    Coenzyme Q10 (COQ10 PO) Take by mouth. Take one daily   levothyroxine  (SYNTHROID ) 100 MCG tablet TAKE 1 TABLET DAILY   Magnesium 250 MG TABS Take 400 mg by mouth.   Multiple Vitamins-Minerals (PRESERVISION AREDS PO) Take 1 tablet by mouth daily.    Omega-3 Fatty Acids (FISH OIL) 1000 MG CPDR Take 3,000 mg by mouth daily.    Probiotic Product (TRUBIOTICS PO) Take 1 capsule by mouth.   TRAMADOL  HCL ER PO Take by mouth.   Turmeric (QC TUMERIC COMPLEX) 500 MG CAPS Take 1,000 mg by mouth 2 (two) times daily.   [DISCONTINUED] sulfamethoxazole -trimethoprim  (BACTRIM  DS) 800-160 MG tablet Take 1 tablet by mouth 2 (two) times daily. (Patient not taking: Reported on 12/19/2023)   No facility-administered encounter medications on file as of 12/19/2023.    Allergies (verified) Moxifloxacin , Augmentin  [amoxicillin -pot clavulanate], and Doxycycline    History: Past Medical History:  Diagnosis Date   Asthma    COVID-19 virus infection 10/2020   Diverticulosis    Hypertension    Hypothyroidism    IBS (irritable bowel syndrome)    Lichen planus    Morbid obesity (HCC)    Pancreatitis due to common bile duct stone 2000   Pneumonia due to COVID-19 virus 07/09/2020   Past Surgical History:  Procedure Laterality Date   CHOLECYSTECTOMY  COLONOSCOPY  2011   COLONOSCOPY WITH PROPOFOL  N/A 06/13/2022   Procedure: COLONOSCOPY WITH PROPOFOL ;  Surgeon: Therisa Bi, MD;  Location: [facility] ENDOSCOPY;  Service: Gastroenterology;  Laterality: N/A;   ESOPHAGOGASTRODUODENOSCOPY N/A 05/29/2022   Procedure: ESOPHAGOGASTRODUODENOSCOPY (EGD);  Surgeon: Therisa Bi, MD;  Location[facility] END[facility]COPY;  Service: Gastroenterology;  Laterality: N/A;   HERNIA REPAIR     MOUTH SURGERY  09/2016   TUBAL LIGATION     Family History  Problem Relation Age of Onset   Hypothyroidism Mother    Hypertension Mother    Dementia Father     Dementia Paternal Grandmother    Social History   Socioeconomic History   Marital status: Divorced    Spouse name: Not on file   Number of children: Not on file   Years of education: Not on file   Highest education level: Not on file  Occupational History   Not on file  Tobacco Use   Smoking status: Never   Smokeless tobacco: Never  Vaping Use   Vaping status: Never Used  Substance and Sexual Activity   Alcohol use: No   Drug use: No   Sexual activity: Never  Other Topics Concern   Not on file  Social History Narrative   Not on file   Social Drivers of Health   Financial Resource Strain: Low Risk  (12/19/2023)   Overall Financial Resource Strain (CARDIA)    Difficulty of Paying Living Expenses: Not hard at all  Food Insecurity: No Food Insecurity (12/19/2023)   Hunger Vital Sign    Worried About Running Out of Food in the Last Year: Never true    Ran Out of Food in the Last Year: Never true  Transportation Needs: No Transportation Needs (12/19/2023)   PRAPARE - Administrator, Civil Service (Medical): No    Lack of Transportation (Non-Medical): No  Physical Activity: Sufficiently Active (12/19/2023)   Exercise Vital Sign    Days of Exercise per Week: 6 days    Minutes of Exercise per Session: 40 min  Stress: No Stress Concern Present (12/19/2023)   [employer] of Occupational Health - Occupational Stress Questionnaire    Feeling of Stress : Not at all  Social Connections: Moderately Integrated (12/19/2023)   Social Connection and Isolation Panel [NHANES]    Frequency of Communication with Friends and Family: More than three times a week    Frequency of Social Gatherings with Friends and Family: More than three times a week    Attends Religious Services: More than 4 times per year    Active Member of [employer] or Organizations: Yes    Attends Engineer, Structural: More than 4 times per year    Marital Status: Divorced    Tobacco Counseling Counseling  given: Not Answered   Clinical Intake:  Pre-visit preparation completed: Yes  Pain : No/denies pain     BMI - recorded: 30.23 Nutritional Status: BMI > 30  Obese Nutritional Risks: None Diabetes: No  How often do you need to have someone help you when you read instructions, pamphlets, or other written materials from your doctor or pharmacy?: 1 - Never  Interpreter Needed?: No  Information entered by :: R. Annamae Shivley LPN   Activities of Daily Living    12/19/2023    8:42 AM  In your present state of health, do you have any difficulty performing the following activities:  Hearing? 1  Comment right ear  Vision? 0  Comment readers  Difficulty concentrating  or making decisions? 0  Walking or climbing stairs? 0  Dressing or bathing? 0  Doing errands, shopping? 0  Preparing Food and eating ? N  Using the Toilet? N  In the past six months, have you accidently leaked urine? Y  Do you have problems with loss of bowel control? N  Managing your Medications? N  Managing your Finances? N  Housekeeping or managing your Housekeeping? N    Patient Care Team: Maribeth Camellia MATSU, MD as PCP - General (Family Medicine)  Indicate any recent Medical Services you may have received from other than [facility] providers in the past year (date may be approximate).     Assessment:   This is a routine wellness examination for Lenetta.  Hearing/Vision screen Hearing Screening - Comments:: Right ear Vision Screening - Comments:: readers   Goals Addressed             This Visit's Progress    Patient Stated       Wants to continue to watch what she eats and be active       Depression Screen    12/19/2023    8:53 AM 12/15/2023   10:21 AM 10/07/2023    8:35 AM 09/09/2023   10:07 AM 05/16/2023    2:09 PM 03/10/2023    9:50 AM 11/20/2022   11:23 AM  PHQ 2/9 Scores  PHQ - 2 Score 0 0 0 0 0 0 0  PHQ- 9 Score 0  0 3 0      Fall Risk    12/19/2023    8:45 AM 12/15/2023   10:21 AM 10/07/2023     8:28 AM 09/09/2023   10:07 AM 05/16/2023    2:09 PM  Fall Risk   Falls in the past year? 0 0 0 0 0  Number falls in past yr: 0 0 0 0 0  Injury with Fall? 0 0 0 0 0  Risk for fall due to : No Fall Risks No Fall Risks No Fall Risks No Fall Risks No Fall Risks  Follow up Falls prevention discussed;Falls evaluation completed Falls evaluation completed Falls evaluation completed Falls evaluation completed Falls evaluation completed    MEDICARE RISK AT HOME: Medicare Risk at Home Any stairs in or around the home?: Yes If so, are there any without handrails?: No Home free of loose throw rugs in walkways, pet beds, electrical cords, etc?: Yes Adequate lighting in your home to reduce risk of falls?: Yes Life alert?: No Use of a cane, walker or w/c?: No Grab bars in the bathroom?: Yes Shower chair or bench in shower?: Yes Elevated toilet seat or a handicapped toilet?: Yes   Cognitive Function:    10/19/2018   10:23 AM 10/16/2016   10:56 AM  MMSE - Mini Mental State Exam  Orientation to time 5 5  Orientation to Place 5 5  Registration 3 3  Attention/ Calculation 4 5  Recall 3 3  Language- name 2 objects 2 2  Language- repeat 1 1  Language- follow 3 step command 3 3  Language- read & follow direction 1 1  Write a sentence 1 1  Copy design 1 1  Total score 29 30        12/19/2023    8:58 AM 11/15/2022    9:58 AM 10/16/2017   10:36 AM  6CIT Screen  What Year? 0 points 0 points 0 points  What month? 0 points 0 points 0 points  What time? 0 points 0  points 0 points  Count back from 20 0 points  0 points  Months in reverse 0 points  0 points  Repeat phrase 0 points  0 points  Total Score 0 points  0 points    Immunizations Immunization History  Administered Date(s) Administered   Fluad Quad(high Dose 65+) 07/21/2019, 08/09/2020, 08/03/2021, 08/05/2022   Influenza Split 10/01/2012   Influenza, High Dose Seasonal PF 08/21/2016, 09/02/2017, 10/19/2018   Influenza,inj,Quad PF,6+  Mos 07/27/2013, 11/02/2014, 11/09/2015   Pneumococcal Conjugate-13 11/02/2014   Pneumococcal Polysaccharide-23 10/01/2012    TDAP status: Due, Education has been provided regarding the importance of this vaccine. Advised may receive this vaccine at local pharmacy or Health Dept. Aware to provide a copy of the vaccination record if obtained from local pharmacy or Health Dept. Verbalized acceptance and understanding.  Flu Vaccine status: Due, Education has been provided regarding the importance of this vaccine. Advised may receive this vaccine at local pharmacy or Health Dept. Aware to provide a copy of the vaccination record if obtained from local pharmacy or Health Dept. Verbalized acceptance and understanding.  Pneumococcal vaccine status: Up to date  Covid-19 vaccine status: Declined, Education has been provided regarding the importance of this vaccine but patient still declined. Advised may receive this vaccine at local pharmacy or Health Dept.or vaccine clinic. Aware to provide a copy of the vaccination record if obtained from local pharmacy or Health Dept. Verbalized acceptance and understanding.  Qualifies for Shingles Vaccine? Yes   Zostavax completed No   Shingrix Completed?: No.    Education has been provided regarding the importance of this vaccine. Patient has been advised to call insurance company to determine out of pocket expense if they have not yet received this vaccine. Advised may also receive vaccine at local pharmacy or Health Dept. Verbalized acceptance and understanding.  Screening Tests Health Maintenance  Topic Date Due   Medicare Annual Wellness (AWV)  11/16/2023   INFLUENZA VACCINE  02/09/2024 (Originally 06/12/2023)   Zoster Vaccines- Shingrix (1 of 2) 03/13/2024 (Originally 06/30/1960)   Pneumonia Vaccine 71+ Years old  Completed   DEXA SCAN  Completed   HPV VACCINES  Aged Out   DTaP/Tdap/Td  Discontinued   Colonoscopy  Discontinued   COVID-19 Vaccine   Discontinued    Health Maintenance  Health Maintenance Due  Topic Date Due   Medicare Annual Wellness (AWV)  11/16/2023    Colorectal cancer screening: No longer required.   Mammogram status: No longer required due to age. Patient stated that she will let her PCP know if she decides to have one.  Bone Density status: Completed 07/2022. Results reflect: Bone density results: OSTEOPOROSIS. Repeat every 2 years.  Lung Cancer Screening: (Low Dose CT Chest recommended if Age 62-80 years, 20 pack-year currently smoking OR have quit w/in 15years.) does not qualify.     Additional Screening:  Hepatitis C Screening: does not qualify; Completed NA age  Vision Screening: Recommended annual ophthalmology exams for early detection of glaucoma and other disorders of the eye. Is the patient up to date with their annual eye exam?  Yes Who is the provider or what is the name of the office in which the patient attends annual eye exams? [facility] If pt is not established with a provider, would they like to be referred to a provider to establish care? No .   Dental Screening: Recommended annual dental exams for proper oral hygiene    Community Resource Referral / Chronic Care Management: CRR required  this visit?  No   CCM required this visit?  No     Plan:     I have personally reviewed and noted the following in the patient's chart:   Medical and social history Use of alcohol, tobacco or illicit drugs  Current medications and supplements including opioid prescriptions. Patient is currently taking opioid prescriptions. Information provided to patient regarding non-opioid alternatives. Patient advised to discuss non-opioid treatment plan with their provider. Functional ability and status Nutritional status Physical activity Advanced directives List of other physicians Hospitalizations, surgeries, and ER visits in previous 12 months Vitals Screenings to include cognitive,  depression, and falls Referrals and appointments  In addition, I have reviewed and discussed with patient certain preventive protocols, quality metrics, and best practice recommendations. A written personalized care plan for preventive services as well as general preventive health recommendations were provided to patient.     Angeline Fredericks, LPN   05/12/7973   After Visit Summary: (MyChart) Due to this being a telephonic visit, the after visit summary with patients personalized plan was offered to patient via MyChart   Nurse Notes: None

## 2023-12-19 NOTE — Patient Instructions (Signed)
 Ms. Kolden , Thank you for taking time to come for your Medicare Wellness Visit. I appreciate your ongoing commitment to your health goals. Please review the following plan we discussed and let me know if I can assist you in the future.   Referrals/Orders/Follow-Ups/Clinician Recommendations: Remember to update your Tetanus and flu vaccines.  This is a list of the screening recommended for you and due dates:  Health Maintenance  Topic Date Due   Flu Shot  02/09/2024*   Zoster (Shingles) Vaccine (1 of 2) 03/13/2024*   Medicare Annual Wellness Visit  12/18/2024   Pneumonia Vaccine  Completed   DEXA scan (bone density measurement)  Completed   HPV Vaccine  Aged Out   DTaP/Tdap/Td vaccine  Discontinued   Colon Cancer Screening  Discontinued   COVID-19 Vaccine  Discontinued  *Topic was postponed. The date shown is not the original due date.    Advanced directives: (Declined) Advance directive discussed with you today. Even though you declined this today, please call our office should you change your mind, and we can give you the proper paperwork for you to fill out.  Next Medicare Annual Wellness Visit scheduled for next year: Yes 12/22/24 @ 10:10   Managing Pain Without Opioids Opioids are strong medicines used to treat moderate to severe pain. For some people, especially those who have long-term (chronic) pain, opioids may not be the best choice for pain management due to: Side effects like nausea, constipation, and sleepiness. The risk of addiction (opioid use disorder). The longer you take opioids, the greater your risk of addiction. Pain that lasts for more than 3 months is called chronic pain. Managing chronic pain usually requires more than one approach and is often provided by a team of health care providers working together (multidisciplinary approach). Pain management may be done at a pain management center or pain clinic. How to manage pain without the use of opioids Use  non-opioid medicines Non-opioid medicines for pain may include: Over-the-counter or prescription non-steroidal anti-inflammatory drugs (NSAIDs). These may be the first medicines used for pain. They work well for muscle and bone pain, and they reduce swelling. Acetaminophen . This over-the-counter medicine may work well for milder pain but not swelling. Antidepressants. These may be used to treat chronic pain. A certain type of antidepressant (tricyclics) is often used. These medicines are given in lower doses for pain than when used for depression. Anticonvulsants. These are usually used to treat seizures but may also reduce nerve (neuropathic) pain. Muscle relaxants. These relieve pain caused by sudden muscle tightening (spasms). You may also use a pain medicine that is applied to the skin as a patch, cream, or gel (topical analgesic), such as a numbing medicine. These may cause fewer side effects than medicines taken by mouth. Do certain therapies as directed Some therapies can help with pain management. They include: Physical therapy. You will do exercises to gain strength and flexibility. A physical therapist may teach you exercises to move and stretch parts of your body that are weak, stiff, or painful. You can learn these exercises at physical therapy visits and practice them at home. Physical therapy may also involve: Massage. Heat wraps or applying heat or cold to affected areas. Electrical signals that interrupt pain signals (transcutaneous electrical nerve stimulation, TENS). Weak lasers that reduce pain and swelling (low-level laser therapy). Signals from your body that help you learn to regulate pain (biofeedback). Occupational therapy. This helps you to learn ways to function at home and work with less  pain. Recreational therapy. This involves trying new activities or hobbies, such as a physical activity or drawing. Mental health therapy, including: Cognitive behavioral therapy (CBT).  This helps you learn coping skills for dealing with pain. Acceptance and commitment therapy (ACT) to change the way you think and react to pain. Relaxation therapies, including muscle relaxation exercises and mindfulness-based stress reduction. Pain management counseling. This may be individual, family, or group counseling.  Receive medical treatments Medical treatments for pain management include: Nerve block injections. These may include a pain blocker and anti-inflammatory medicines. You may have injections: Near the spine to relieve chronic back or neck pain. Into joints to relieve back or joint pain. Into nerve areas that supply a painful area to relieve body pain. Into muscles (trigger point injections) to relieve some painful muscle conditions. A medical device placed near your spine to help block pain signals and relieve nerve pain or chronic back pain (spinal cord stimulation device). Acupuncture. Follow these instructions at home Medicines Take over-the-counter and prescription medicines only as told by your health care provider. If you are taking pain medicine, ask your health care providers about possible side effects to watch out for. Do not drive or use heavy machinery while taking prescription opioid pain medicine. Lifestyle  Do not use drugs or alcohol to reduce pain. If you drink alcohol, limit how much you have to: 0-1 drink a day for women who are not pregnant. 0-2 drinks a day for men. Know how much alcohol is in a drink. In the [place] one drink equals one 12 oz bottle of beer (355 mL), one 5 oz glass of wine (148 mL), or one 1 oz glass of hard liquor (44 mL). Do not use any products that contain nicotine or tobacco. These products include cigarettes, chewing tobacco, and vaping devices, such as e-cigarettes. If you need help quitting, ask your health care provider. Eat a healthy diet and maintain a healthy weight. Poor diet and excess weight may make pain worse. Eat  foods that are high in fiber. These include fresh fruits and vegetables, whole grains, and beans. Limit foods that are high in fat and processed sugars, such as fried and sweet foods. Exercise regularly. Exercise lowers stress and may help relieve pain. Ask your health care provider what activities and exercises are safe for you. If your health care provider approves, join an exercise class that combines movement and stress reduction. Examples include yoga and tai chi. Get enough sleep. Lack of sleep may make pain worse. Lower stress as much as possible. Practice stress reduction techniques as told by your therapist. General instructions Work with all your pain management providers to find the treatments that work best for you. You are an important member of your pain management team. There are many things you can do to reduce pain on your own. Consider joining an online or in-person support group for people who have chronic pain. Keep all follow-up visits. This is important. Where to find more information You can find more information about managing pain without opioids from: American Academy of Pain Medicine: painmed.org Institute for Chronic Pain: instituteforchronicpain.org American Chronic Pain Association: theacpa.org Contact a health care provider if: You have side effects from pain medicine. Your pain gets worse or does not get better with treatments or home therapy. You are struggling with anxiety or depression. Summary Many types of pain can be managed without opioids. Chronic pain may respond better to pain management without opioids. Pain is best managed when you  and a team of health care providers work together. Pain management without opioids may include non-opioid medicines, medical treatments, physical therapy, mental health therapy, and lifestyle changes. Tell your health care providers if your pain gets worse or is not being managed well enough. This information is not  intended to replace advice given to you by your health care provider. Make sure you discuss any questions you have with your health care provider. Document Revised: 02/07/2021 Document Reviewed: 02/07/2021 Elsevier Patient Education  2024 [employer].

## 2024-01-26 DIAGNOSIS — G4733 Obstructive sleep apnea (adult) (pediatric): Secondary | ICD-10-CM | POA: Diagnosis not present

## 2024-02-06 DIAGNOSIS — G4733 Obstructive sleep apnea (adult) (pediatric): Secondary | ICD-10-CM | POA: Diagnosis not present

## 2024-02-25 DIAGNOSIS — G4733 Obstructive sleep apnea (adult) (pediatric): Secondary | ICD-10-CM | POA: Diagnosis not present

## 2024-02-26 ENCOUNTER — Ambulatory Visit (INDEPENDENT_AMBULATORY_CARE_PROVIDER_SITE_OTHER): Admitting: Nurse Practitioner

## 2024-02-26 ENCOUNTER — Ambulatory Visit: Payer: Self-pay

## 2024-02-26 ENCOUNTER — Encounter: Payer: Self-pay | Admitting: Nurse Practitioner

## 2024-02-26 VITALS — BP 132/74 | HR 63 | Temp 97.8°F | Ht 61.0 in | Wt 163.6 lb

## 2024-02-26 DIAGNOSIS — R0981 Nasal congestion: Secondary | ICD-10-CM | POA: Insufficient documentation

## 2024-02-26 DIAGNOSIS — R42 Dizziness and giddiness: Secondary | ICD-10-CM | POA: Insufficient documentation

## 2024-02-26 MED ORDER — PREDNISONE 10 MG PO TABS: ORAL_TABLET | ORAL | 0 refills | Status: DC

## 2024-02-26 MED ORDER — MECLIZINE HCL 25 MG PO TABS: 12.5000 mg | ORAL_TABLET | Freq: Three times a day (TID) | ORAL | 0 refills | Status: AC | PRN

## 2024-02-26 NOTE — Assessment & Plan Note (Signed)
 Intermittent vertigo with nausea and imbalance, could be linked to increased congestion and ear fullness. Prescribe meclizine as needed for dizziness. Consider ENT referral if symptoms persist. Advised immediate care if symptoms worsen or falls occur. Handouts on vertigo provided.

## 2024-02-26 NOTE — Progress Notes (Signed)
 Cathy Dicker, Cathy Coffey Phone: [phone]  Cathy Coffey is a 83 y.o. female who presents today for vertigo.   Discussed the use of AI scribe software for clinical note transcription with the patient, who gave verbal consent to proceed.  History of Present Illness   Cathy Coffey is an 83 year old female who presents with vertigo and sinus congestion.  She experiences vertigo, describing it as a sensation where 'the whole room went spinning' for a minute or so, particularly when moving or bending over. This sensation has been severe enough to require her to grab hold of something to steady herself. The vertigo was notably triggered after a dental cleaning when she was laid back for about thirty minutes and upon getting up, she felt woozy. She also experienced vertigo when bending forward to get up from a seated position and when turning in bed, describing it as feeling 'drunk'. She feels nauseous with the vertigo.  She has a history of sinus issues and has undergone sinus surgery on one side. Recently, she has been experiencing severe allergy symptoms since the beginning of the month, including a runny nose that lasted for four days, a persistent cough, and a sensation of fullness in her head. Her ears feel 'stopped up', and she describes a sensation of talking in a 'barrel or a drum'. Despite using a neti pot and a device called a Navage for sinus drainage, she continues to feel congested. She has been using Claritin for allergies but has not found it effective recently.  She is health-conscious, takes vitamins, exercises regularly, and dislikes sitting. She is concerned about the impact of her symptoms on her ability to maintain her active lifestyle.      Social History   Tobacco Use  Smoking Status Never  Smokeless Tobacco Never    Current Outpatient Medications on File Prior to Visit  Medication Sig Dispense Refill   B Complex-C (SUPER B COMPLEX PO) Take 1 capsule by mouth.      Calcium Carbonate-Vitamin D (CALCIUM-VITAMIN D) 500-200 MG-UNIT per tablet Take 3 tablets by mouth daily.      Coenzyme Q10 (COQ10 PO) Take by mouth. Take one daily     levothyroxine (SYNTHROID) 100 MCG tablet Take 1 tablet (100 mcg total) by mouth daily. 90 tablet 0   Magnesium 250 MG TABS Take 400 mg by mouth.     Multiple Vitamins-Minerals (PRESERVISION AREDS PO) Take 1 tablet by mouth daily.      Omega-3 Fatty Acids (FISH OIL) 1000 MG CPDR Take 3,000 mg by mouth daily.      Probiotic Product (TRUBIOTICS PO) Take 1 capsule by mouth.     TRAMADOL HCL ER PO Take by mouth.     Turmeric (QC TUMERIC COMPLEX) 500 MG CAPS Take 1,000 mg by mouth 2 (two) times daily.     No current facility-administered medications on file prior to visit.     ROS see history of present illness  Objective  Physical Exam Vitals:   02/26/24 1035  BP: 132/74  Pulse: 63  Temp: 97.8 F (36.6 C)  SpO2: 96%    BP Readings from Last 3 Encounters:  02/26/24 132/74  12/15/23 120/76  10/07/23 118/74   Wt Readings from Last 3 Encounters:  02/26/24 163 lb 9.6 oz (74.2 kg)  12/19/23 160 lb (72.6 kg)  12/15/23 164 lb (74.4 kg)    Physical Exam Constitutional:      General: She is not in acute distress.    Appearance: Normal  appearance.  HENT:     Head: Normocephalic.     Right Ear: Tympanic membrane normal.     Left Ear: Tympanic membrane normal.     Nose: Nose normal.     Mouth/Throat:     Mouth: Mucous membranes are moist.     Pharynx: Oropharynx is clear.  Eyes:     Conjunctiva/sclera: Conjunctivae normal.     Pupils: Pupils are equal, round, and reactive to light.  Cardiovascular:     Rate and Rhythm: Normal rate and regular rhythm.     Heart sounds: Normal heart sounds.  Pulmonary:     Effort: Pulmonary effort is normal.     Breath sounds: Normal breath sounds.  Lymphadenopathy:     Cervical: No cervical adenopathy.  Skin:    General: Skin is warm and dry.  Neurological:     General:  No focal deficit present.     Mental Status: She is alert.  Psychiatric:        Mood and Affect: Mood normal.        Behavior: Behavior normal.     Assessment/Plan: Please see individual problem list.  Vertigo Assessment & Plan: Intermittent vertigo with nausea and imbalance, could be linked to increased congestion and ear fullness. Prescribe meclizine as needed for dizziness. Consider ENT referral if symptoms persist. Advised immediate care if symptoms worsen or falls occur. Handouts on vertigo provided.   Orders: -     Meclizine HCl; Take 0.5-1 tablets (12.5-25 mg total) by mouth 3 (three) times daily as needed for dizziness.  Dispense: 30 tablet; Refill: 0 -     predniSONE; TAKE 3 TABLETS PO QD FOR 3 DAYS THEN TAKE 2 TABLETS PO QD FOR 3 DAYS THEN TAKE 1 TABLET PO QD FOR 3 DAYS THEN TAKE 1/2 TAB PO QD FOR 3 DAYS  Dispense: 20 tablet; Refill: 0  Sinus congestion Assessment & Plan: Persistent sinus congestion and allergy symptoms are likely exacerbated by high pollen counts. Likely contributing to vertigo. Do not feel that antibiotics are warranted. Prescribe Prednisone taper for sinus congestion and ear fullness. Switch from Claritin to Zyrtec for allergy management. Recommended Flonase nasal spray for sinus and ear congestion. Continue neti pot or nasal lavage for sinus drainage. Return to care if symptoms are worsening or persisting.   Orders: -     predniSONE; TAKE 3 TABLETS PO QD FOR 3 DAYS THEN TAKE 2 TABLETS PO QD FOR 3 DAYS THEN TAKE 1 TABLET PO QD FOR 3 DAYS THEN TAKE 1/2 TAB PO QD FOR 3 DAYS  Dispense: 20 tablet; Refill: 0    Return if symptoms worsen or fail to improve.   Bluford Burkitt, Cathy Coffey  Primary Care - [facility]

## 2024-02-26 NOTE — Assessment & Plan Note (Signed)
 Persistent sinus congestion and allergy symptoms are likely exacerbated by high pollen counts. Likely contributing to vertigo. Do not feel that antibiotics are warranted. Prescribe Prednisone taper for sinus congestion and ear fullness. Switch from Claritin to Zyrtec for allergy management. Recommended Flonase nasal spray for sinus and ear congestion. Continue neti pot or nasal lavage for sinus drainage. Return to care if symptoms are worsening or persisting.

## 2024-02-26 NOTE — Telephone Encounter (Signed)
  Chief Complaint: Vertigo -  Symptoms: Room spinning - can hardly walk Frequency: Started end of March - much worse since/ 02/22/2024 Pertinent Negatives: Patient denies vomiting, fever. Disposition: [] ED /[] Urgent Care (no appt availability in office) / [x] Appointment(In office/virtual)/ []  [place] [facility]/ [] Home Care/ [] Refused Recommended Disposition /[] [facility]/ []  Follow-up with PCP Additional Notes: Pt states room is spinning. Sinus congestion, ears are full. Nausea.    Copied from CRM [id number]. Topic: Clinical - Red Word Triage >> Feb 26, 2024  8:10 AM Kita Perish H wrote: [facility] that prompted transfer to Nurse Triage: Can't move head, room spinning, nausea, dizzy and off balance Reason for Disposition  [1] MODERATE dizziness (e.g., vertigo; feels very unsteady, interferes with normal activities) AND [2] has NOT been evaluated by doctor (or NP/PA) for this  Answer Assessment - Initial Assessment Questions 1. DESCRIPTION: "Describe your dizziness."     Room spinning 2. VERTIGO: "Do you feel like either you or the room is spinning or tilting?"      Room spinning 3. LIGHTHEADED: "Do you feel lightheaded?" (e.g., somewhat faint, woozy, weak upon standing)     Off balance 4. SEVERITY: "How bad is it?"  "Can you walk?"   - MILD: Feels slightly dizzy and unsteady, but is walking normally.   - MODERATE: Feels unsteady when walking, but not falling; interferes with normal activities (e.g., school, work).   - SEVERE: Unable to walk without falling, or requires assistance to walk without falling.     severe 5. ONSET:  "When did the dizziness begin?"     Got sick March 30th - URI 6. AGGRAVATING FACTORS: "Does anything make it worse?" (e.g., standing, change in head position)     movement 7. CAUSE: "What do you think is causing the dizziness?"    Ear issue> 8. RECURRENT SYMPTOM: "Have you had dizziness before?" If Yes, ask: "When was the last time?" "What happened  that time?"     Yes 15 years ago 9. OTHER SYMPTOMS: "Do you have any other symptoms?" (e.g., headache, weakness, numbness, vomiting, earache)     Nausea, "head is stopped up" sinus/head congestion.  Protocols used: Dizziness - Vertigo-A-AH

## 2024-02-27 ENCOUNTER — Encounter

## 2024-03-10 ENCOUNTER — Other Ambulatory Visit: Payer: Self-pay

## 2024-03-10 MED ORDER — LEVOTHYROXINE SODIUM 100 MCG PO TABS: 100.0000 ug | ORAL_TABLET | Freq: Every day | ORAL | 0 refills | Status: DC

## 2024-03-10 NOTE — Telephone Encounter (Signed)
 Copied from CRM [id number]. Topic: Clinical - Medication Refill >> Mar 10, 2024  9:31 AM Jethro Morrison wrote: Most Recent Primary Care Visit:  Provider: Bluford Burkitt  Department: [facility]  Visit Type: ACUTE  Date: 02/26/2024  Medication: levothyroxine  (SYNTHROID ) 100  Has the patient contacted their pharmacy? Yes (Agent: If no, request that the patient contact the pharmacy for the refill. If patient does not wish to contact the pharmacy document the reason why and proceed with request.) (Agent: If yes, when and what did the pharmacy advise?)  Is this the correct pharmacy for this prescription? Yes If no, delete pharmacy and type the correct one.  This is the patient's preferred pharmacy:  [id number] [place], [place] - [address] [place] [address] Phone: [phone] Fax: [phone]     Has the prescription been filled recently? Yes  Is the patient out of the medication? No  Has the patient been seen for an appointment in the last year OR does the patient have an upcoming appointment? Yes  Can we respond through MyChart? Yes  Agent: Please be advised that Rx refills may take up to 3 business days. We ask that you follow-up with your pharmacy.

## 2024-03-17 ENCOUNTER — Encounter (HOSPITAL_COMMUNITY): Payer: Self-pay

## 2024-03-24 ENCOUNTER — Ambulatory Visit (INDEPENDENT_AMBULATORY_CARE_PROVIDER_SITE_OTHER)

## 2024-03-24 ENCOUNTER — Ambulatory Visit: Payer: Self-pay

## 2024-03-24 ENCOUNTER — Telehealth: Payer: Self-pay

## 2024-03-24 VITALS — BP 124/84 | HR 79 | Ht 61.0 in | Wt 160.8 lb

## 2024-03-24 DIAGNOSIS — R5383 Other fatigue: Secondary | ICD-10-CM | POA: Diagnosis not present

## 2024-03-24 DIAGNOSIS — E673 Hypervitaminosis D: Secondary | ICD-10-CM | POA: Insufficient documentation

## 2024-03-24 DIAGNOSIS — E039 Hypothyroidism, unspecified: Secondary | ICD-10-CM

## 2024-03-24 DIAGNOSIS — G4733 Obstructive sleep apnea (adult) (pediatric): Secondary | ICD-10-CM

## 2024-03-24 DIAGNOSIS — E669 Obesity, unspecified: Secondary | ICD-10-CM | POA: Insufficient documentation

## 2024-03-24 DIAGNOSIS — K219 Gastro-esophageal reflux disease without esophagitis: Secondary | ICD-10-CM

## 2024-03-24 DIAGNOSIS — D729 Disorder of white blood cells, unspecified: Secondary | ICD-10-CM | POA: Diagnosis not present

## 2024-03-24 DIAGNOSIS — M62838 Other muscle spasm: Secondary | ICD-10-CM

## 2024-03-24 DIAGNOSIS — R7303 Prediabetes: Secondary | ICD-10-CM

## 2024-03-24 DIAGNOSIS — N281 Cyst of kidney, acquired: Secondary | ICD-10-CM

## 2024-03-24 DIAGNOSIS — R6889 Other general symptoms and signs: Secondary | ICD-10-CM

## 2024-03-24 DIAGNOSIS — R7301 Impaired fasting glucose: Secondary | ICD-10-CM

## 2024-03-24 DIAGNOSIS — E66811 Obesity, class 1: Secondary | ICD-10-CM

## 2024-03-24 DIAGNOSIS — E782 Mixed hyperlipidemia: Secondary | ICD-10-CM

## 2024-03-24 HISTORY — DX: Hypervitaminosis D: E67.3

## 2024-03-24 HISTORY — DX: Other muscle spasm: M62.838

## 2024-03-24 LAB — TSH: TSH: 0.45 u[IU]/mL (ref 0.35–5.50)

## 2024-03-24 LAB — CBC WITH DIFFERENTIAL/PLATELET
Basophils Absolute: 0 10*3/uL (ref 0.0–0.1)
Basophils Relative: 0.8 % (ref 0.0–3.0)
Eosinophils Absolute: 0.4 10*3/uL (ref 0.0–0.7)
Eosinophils Relative: 8.5 % — ABNORMAL HIGH (ref 0.0–5.0)
HCT: 40.7 % (ref 36.0–46.0)
Hemoglobin: 13.4 g/dL (ref 12.0–15.0)
Lymphocytes Relative: 22.3 % (ref 12.0–46.0)
Lymphs Abs: 1 10*3/uL (ref 0.7–4.0)
MCHC: 32.9 g/dL (ref 30.0–36.0)
MCV: 90.7 fl (ref 78.0–100.0)
Monocytes Absolute: 0.5 10*3/uL (ref 0.1–1.0)
Monocytes Relative: 9.9 % (ref 3.0–12.0)
Neutro Abs: 2.7 10*3/uL (ref 1.4–7.7)
Neutrophils Relative %: 58.5 % (ref 43.0–77.0)
Platelets: 186 10*3/uL (ref 150.0–400.0)
RBC: 4.49 Mil/uL (ref 3.87–5.11)
RDW: 14.4 % (ref 11.5–15.5)
WBC: 4.6 10*3/uL (ref 4.0–10.5)

## 2024-03-24 LAB — COMPREHENSIVE METABOLIC PANEL WITH GFR
ALT: 17 U/L (ref 0–35)
AST: 20 U/L (ref 0–37)
Albumin: 4.3 g/dL (ref 3.5–5.2)
Alkaline Phosphatase: 71 U/L (ref 39–117)
BUN: 19 mg/dL (ref 6–23)
CO2: 29 meq/L (ref 19–32)
Calcium: 9.6 mg/dL (ref 8.4–10.5)
Chloride: 103 meq/L (ref 96–112)
Creatinine, Ser: 0.77 mg/dL (ref 0.40–1.20)
GFR: 72.69 mL/min (ref >60.00)
Glucose, Bld: 96 mg/dL (ref 70–99)
Potassium: 4.7 meq/L (ref 3.5–5.1)
Sodium: 139 meq/L (ref 135–145)
Total Bilirubin: 0.4 mg/dL (ref 0.2–1.2)
Total Protein: 7 g/dL (ref 6.0–8.3)

## 2024-03-24 LAB — VITAMIN D 25 HYDROXY (VIT D DEFICIENCY, FRACTURES): VITD: 114.05 ng/mL (ref 30.00–100.00)

## 2024-03-24 LAB — VITAMIN B12: Vitamin B-12: 539 pg/mL (ref 211–911)

## 2024-03-24 LAB — MAGNESIUM: Magnesium: 2.6 mg/dL — ABNORMAL HIGH (ref 1.5–2.5)

## 2024-03-24 LAB — HEMOGLOBIN A1C: Hgb A1c MFr Bld: 5.7 % (ref 4.6–6.5)

## 2024-03-24 MED ORDER — LEVOTHYROXINE SODIUM 100 MCG PO TABS: 100.0000 ug | ORAL_TABLET | Freq: Every day | ORAL | 0 refills | Status: DC

## 2024-03-24 NOTE — Assessment & Plan Note (Signed)
 Will check CBC, CMP, B12, vitamin D  level. Encourage daily moderate intensity exercise for 30 min for 5 days a week or 150 min total per week. Discussed proper sleep hygiene with the patient.  If lab work normal would recommend referral to sleep clinic.

## 2024-03-24 NOTE — Telephone Encounter (Signed)
 CRITICAL VALUE STICKER  CRITICAL VALUE: VIT D 114.05  RECEIVER (on-site recipient of call): Sharmila Wrobleski K, RMA  DATE & TIME NOTIFIED: 03/24/24 3:00pm  MESSENGER (representative from lab): Camilo Cella  MD NOTIFIED: Dr. Casimir Cleaver  TIME OF NOTIFICATION: 3:05pm

## 2024-03-24 NOTE — Assessment & Plan Note (Signed)
 Currently taking Magnesium supplements three times a day. Counseled on common s/e including lethargy, diarrhea etc with too much Mg. Check serum Magnesium level today.

## 2024-03-24 NOTE — Assessment & Plan Note (Signed)
 Consider urology referral if patient has flank pain, hematuria, fever, chills, unexpected weight loss, elevated Cr.

## 2024-03-24 NOTE — Assessment & Plan Note (Signed)
 Check A1c today. Diet and life style modification discussed with the patient.

## 2024-03-24 NOTE — Assessment & Plan Note (Signed)
 Reviewed normal TSH from 03/10/23. Recheck TSH today.  On Levothyroxine  100 mcg, daily, continue

## 2024-03-24 NOTE — Assessment & Plan Note (Signed)
 Recommend Mediterranean like diet, increasing moderate intensity exercise.  Check fasting lipid panel today.  Patient does not want to be on statin if she has elevated LDL.

## 2024-03-24 NOTE — Assessment & Plan Note (Signed)
 On CPAP ,continue

## 2024-03-24 NOTE — Telephone Encounter (Signed)
 Noted. Will reach out to the patient.   Jacklin Mascot, MD

## 2024-03-24 NOTE — Progress Notes (Signed)
 Please let the patient know her vitamin d  level is high. Excess vitamin d  can cause fatigue, constipation. I recommend holding vitamin D  supplement and repeating vitamin D  level in 8 weeks to monitor for normalization.   Her serum Magnesium is also elevated. This can also contribute to feeling fatigue, getting nauseated, muscle weakness, heart rhythm problem. I recommend she only take Magnesium Glycinate once a day and hold off on other magnesium supplement. Repeat Magnesium in 8 weeks.   Thyroid  hormone level, kidney, liver function, electrolytes, vitamin B 12 are within normal range.Her HbA1c is stable.    If patient has questions please offer office visit to discuss question/concerns.  Thank you, Cathy Mascot, MD

## 2024-03-24 NOTE — Assessment & Plan Note (Signed)
 Check vitamin D level

## 2024-03-24 NOTE — Assessment & Plan Note (Signed)
 Low WBC on lab from 11/14/2022. Given ongoing fatigue check CBC and to f/u on counts.

## 2024-03-24 NOTE — Assessment & Plan Note (Signed)
 Very rare symptoms, however current episode of neck spasm could be related to untreated GERD.  Patient counseled on keeping symptom journal and scheduling a follow-up appointment to determine further evaluation.  Will consider swallow study, trial of PPI, EGD if symptoms persist.

## 2024-03-24 NOTE — Patient Instructions (Signed)
-   Please keep a symptom journal for neck spasm. If it continues to happen I would recommend getting a swallow study.  - We are updating your labs today and we will reach out to you with results.

## 2024-03-24 NOTE — Progress Notes (Signed)
 Established Patient Office Visit   Subjective  Patient ID: Cathy Coffey, female    DOB: 11-Aug-1941  Age: 83 y.o. MRN: [id number]  Chief Complaint  Patient presents with   Fatigue    She  has a past medical history of Asthma, COVID-19 virus infection (10/2020), Diverticulosis, Hypertension, Hypothyroidism, IBS (irritable bowel syndrome), Lichen planus, Morbid obesity (HCC), Pancreatitis due to common bile duct stone (2000), and Pneumonia due to COVID-19 virus (07/09/2020).  HPI 1) Hypothyroidism: On Levothyroxine  100 mcg, first thing in the morning on an empty stomach. Patient denies new skin dryness, hair fall, cold intolerance. She continues to feel tired.  2) OSA: She has a h/o OSA, uses CPAP about 6 hours per night. Her current machine is about 6-7 months. She does not always feel refreshed in the morning. She uses CPAP every night unless she is feeling unwell. She is getting about 6 hours of sleep at night.   3) Feeling fatigue: Patient reports she has noted her endurance has reduced compared to when she was younger.  She works in her garden but has not been exercising regularly. She eats mostly homemade meals. B 12 from 12/31/2022 was normal about 475. Normal BMP on 05/23/2023.  Normal Magnesium in 03/10/2023. Last TSH normal on 03/10/2023: 1.89  4) Hypertension: Used to be on Amlodipine  but is not taking any medication. Denies home Bp check. Denies chest pain, lower leg edema.   6) Supplements intake:  Takes B-complex, Coq10, multivitamin, Vitamin D  (20,000 units) and calcium  supplement. She takes Magnesium Glycinate at bedtime and Magnesium Threonate around 1 PM. She also takes daily probiotic.  7) Vertigo: She was treated with Meclizine  and Prednisone  in 02/26/24. She reports she has complete symptoms resolution at this time. Still has has Meclizine  prescription left over from her last episode.   8)  Spasm of throat for about one week, mostly related with eating or drinking. No  aspiration. Has a h/o GERD, used to take medication for it in the past. Has not needed this. No unintentional weight loss, fever, change in B/B habit.   9) Right kidney cyst: Evident on MRI and CT abdomen and pelvis from 05/2022. No blood in urine, no urinary frequency, urgency, lower leg edema.   10) H/O dyslipidemia: Not on medication due to personal choice.   11) H/O prediabetes: Last A1c normal.  ROS As per HPI    Objective:     BP 124/84   Pulse 79   Ht 5\' 1"  (1.549 m)   Wt 160 lb 12.8 oz (72.9 kg)   SpO2 96%   BMI 30.38 kg/m      03/24/2024   11:04 AM 12/19/2023    8:53 AM 12/15/2023   10:21 AM  Depression screen PHQ 2/9  Decreased Interest 0 0 0  Down, Depressed, Hopeless 0 0 0  PHQ - 2 Score 0 0 0  Altered sleeping 0 0   Tired, decreased energy 1 0   Change in appetite 0 0   Feeling bad or failure about yourself  0 0   Trouble concentrating 0 0   Moving slowly or fidgety/restless 0 0   Suicidal thoughts 0 0   PHQ-9 Score 1 0   Difficult doing work/chores Not difficult at all Not difficult at all       03/24/2024   11:04 AM 10/07/2023    8:35 AM 09/09/2023   10:08 AM 05/16/2023    2:10 PM  GAD 7 : Generalized Anxiety Score  Nervous, Anxious, on Edge 0 0 0 0  Control/stop worrying 0 0 0 0  Worry too much - different things 0 0 3 0  Trouble relaxing 0 0 3 0  Restless 0 0 0 0  Easily annoyed or irritable 0 0 0 0  Afraid - awful might happen 0 0 2 0  Total GAD 7 Score 0 0 8 0  Anxiety Difficulty Not difficult at all Not difficult at all Somewhat difficult Not difficult at all    Physical Exam Constitutional:      Appearance: Normal appearance.  HENT:     Head: Normocephalic and atraumatic.     Right Ear: Tympanic membrane normal.     Left Ear: Tympanic membrane normal.     Mouth/Throat:     Mouth: Mucous membranes are moist.  Neck:     Thyroid : No thyroid  mass or thyroid  tenderness.  Cardiovascular:     Rate and Rhythm: Normal rate.     Heart sounds:  No murmur heard. Pulmonary:     Effort: Pulmonary effort is normal.     Breath sounds: Normal breath sounds.  Abdominal:     General: Bowel sounds are normal.     Palpations: Abdomen is soft.  Musculoskeletal:     Cervical back: Neck supple. No rigidity.     Right lower leg: No edema.     Left lower leg: No edema.  Skin:    General: Skin is warm.  Neurological:     Mental Status: She is alert and oriented to person, place, and time.  Psychiatric:        Mood and Affect: Mood normal.        Behavior: Behavior normal.       No results found for any visits on 03/24/24.  The ASCVD Risk score (Arnett DK, et al., 2019) failed to calculate for the following reasons:   The 2019 ASCVD risk score is only valid for ages 76 to 87    Assessment & Plan:  Acquired hypothyroidism Assessment & Plan: Reviewed normal TSH from 03/10/23. Recheck TSH today.  On Levothyroxine  100 mcg, daily, continue  Orders: -     Levothyroxine  Sodium; Take 1 tablet (100 mcg total) by mouth daily.  Dispense: 30 tablet; Refill: 0 -     TSH  Excessive magnesium intake Assessment & Plan: Currently taking Magnesium supplements three times a day. Counseled on common s/e including lethargy, diarrhea etc with too much Mg. Check serum Magnesium level today.   Orders: -     Magnesium -     Comprehensive metabolic panel with GFR  Impaired fasting glucose -     Hemoglobin A1c  Abnormal WBC count Assessment & Plan: Low WBC on lab from 11/14/2022. Given ongoing fatigue check CBC and to f/u on counts.   Orders: -     CBC with Differential/Platelet  Other fatigue Assessment & Plan: Will check CBC, CMP, B12, vitamin D  level. Encourage daily moderate intensity exercise for 30 min for 5 days a week or 150 min total per week. Discussed proper sleep hygiene with the patient.  If lab work normal would recommend referral to sleep clinic.    Orders: -     VITAMIN D  25 Hydroxy (Vit-D Deficiency, Fractures) -      Vitamin B12  Prediabetes Assessment & Plan: Check A1c today. Diet and life style modification discussed with the patient.  Orders: -     Hemoglobin A1c  Mixed hyperlipidemia Assessment & Plan: Recommend  Mediterranean like diet, increasing moderate intensity exercise.  Check fasting lipid panel today.  Patient does not want to be on statin if she has elevated LDL.  Orders: -     Lipid panel; Future  Neck muscle spasm  Gastroesophageal reflux disease, unspecified whether esophagitis present Assessment & Plan: Very rare symptoms, however current episode of neck spasm could be related to untreated GERD.  Patient counseled on keeping symptom journal and scheduling a follow-up appointment to determine further evaluation.  Will consider swallow study, trial of PPI, EGD if symptoms persist.     Obesity (BMI 30.0-34.9) Assessment & Plan: Check vitamin D  level   Solitary cyst of kidney Assessment & Plan: Consider urology referral if patient has flank pain, hematuria, fever, chills, unexpected weight loss, elevated Cr.    OSA (obstructive sleep apnea) Assessment & Plan: On CPAP, continue   Also due for shingles vaccine. Counseled updating this through local pharmacy.   Return in about 6 months (around 09/24/2024) for Chronic follow up.   Jacklin Mascot, MD

## 2024-03-26 DIAGNOSIS — G4733 Obstructive sleep apnea (adult) (pediatric): Secondary | ICD-10-CM | POA: Diagnosis not present

## 2024-04-06 ENCOUNTER — Ambulatory Visit: Payer: Self-pay

## 2024-04-06 ENCOUNTER — Encounter: Payer: Self-pay | Admitting: Nurse Practitioner

## 2024-04-06 ENCOUNTER — Ambulatory Visit (INDEPENDENT_AMBULATORY_CARE_PROVIDER_SITE_OTHER): Admitting: Nurse Practitioner

## 2024-04-06 VITALS — BP 116/78 | HR 71 | Temp 97.4°F | Ht 61.0 in | Wt 163.6 lb

## 2024-04-06 DIAGNOSIS — M545 Low back pain, unspecified: Secondary | ICD-10-CM | POA: Diagnosis not present

## 2024-04-06 DIAGNOSIS — R102 Pelvic and perineal pain: Secondary | ICD-10-CM | POA: Diagnosis not present

## 2024-04-06 LAB — POC URINALSYSI DIPSTICK (AUTOMATED)
Bilirubin, UA: NEGATIVE
Blood, UA: NEGATIVE
Glucose, UA: NEGATIVE
Ketones, UA: NEGATIVE
Leukocytes, UA: NEGATIVE
Nitrite, UA: NEGATIVE
Protein, UA: NEGATIVE
Spec Grav, UA: 1.015 (ref 1.010–1.025)
Urobilinogen, UA: 0.2 U/dL
pH, UA: 7.5 (ref 5.0–8.0)

## 2024-04-06 NOTE — Telephone Encounter (Signed)
  Chief Complaint: lower front pelvic pain, back pain,  Symptoms: pain, Frequency: about a week Pertinent Negatives: Patient denies blood in urine, pain with urination Disposition: [] ED /[] Urgent Care (no appt availability in office) / [x] Appointment(In office/virtual)/ []   Virtual Care/ [] Home Care/ [] Refused Recommended Disposition /[]  Mobile Bus/ []  Follow-up with PCP Additional Notes: Pt states that she has had this before and feels she is starting with a UTI. Pt scheduled today.  Copied from CRM [id number]. Topic: Clinical - Red Word Triage >> Apr 06, 2024  9:55 AM Juluis Ok wrote: [facility] that prompted transfer to Nurse Triage: lower back pain, pain in genitals Reason for Disposition  Side (flank) or lower back pain present  Answer Assessment - Initial Assessment Questions 1. SYMPTOM: "What's the main symptom you're concerned about?" (e.g., frequency, incontinence)     Pain in back and pelvic 2. ONSET: "When did the  pain  start?"     About a week 3. PAIN: "Is there any pain?" If Yes, ask: "How bad is it?" (Scale: 1-10; mild, moderate, severe)     6 4. CAUSE: "What do you think is causing the symptoms?"     Urinary  5. OTHER SYMPTOMS: "Do you have any other symptoms?" (e.g., blood in urine, fever, flank pain, pain with urination)     Flank/back pain  Protocols used: Urinary Symptoms-A-AH

## 2024-04-06 NOTE — Progress Notes (Unsigned)
 Established Patient Office Visit  Subjective:  Patient ID: Cathy Coffey, female    DOB: 07-03-1941  Age: 83 y.o. MRN: [id number]  CC:  Chief Complaint  Patient presents with   Acute Visit    Low back pain x 1 week  Pelvic pain  Feels off  Discussed the use of a AI scribe software for clinical note transcription with the patient, who gave verbal consent to proceed.  HPI  Cathy Coffey is an 83 year old female with spinal stenosis who presents with lower back pain and suprapubic tenderness that started last night.    She has experienced lower back pain for about a week, associated with her spinal stenosis. The pain is an ache across the lower back, worsening with bending over. No saddle anesthesia or pain radiating to the legs. No suprapubic pain today. No urinary symptoms.   Currently, it feels like a pressure sensation in the lower back. She manages the pain with tramadol , Tylenol , and ibuprofen, and has increased her use recently. She also uses ginger and turmeric supplements.  She manages bowel issues with magnesium supplements to prevent constipation. Her last bowel movement was soft this morning, but she has reduced her magnesium intake recently.  She lives with her 65 year old granddaughter and has increased physical activity due to caring for a new puppy, which involves frequent bending over.    Last UTI 10/07/23 culture positive for Serratia marcescens was started on Nitrofurantoin  but was switched to Bactrim  10/11/23  Past Medical History:  Diagnosis Date   Asthma    COVID-19 virus infection 10/2020   Diverticulosis    Hypertension    Hypothyroidism    IBS (irritable bowel syndrome)    Lichen planus    Morbid obesity (HCC)    Pancreatitis due to common bile duct stone 2000   Pneumonia due to COVID-19 virus 07/09/2020    Past Surgical History:  Procedure Laterality Date   CHOLECYSTECTOMY     COLONOSCOPY  2011   COLONOSCOPY WITH PROPOFOL  N/A 06/13/2022    Procedure: COLONOSCOPY WITH PROPOFOL ;  Surgeon: Luke Salaam, MD;  Location: [facility] ENDOSCOPY;  Service: Gastroenterology;  Laterality: N/A;   ESOPHAGOGASTRODUODENOSCOPY N/A 05/29/2022   Procedure: ESOPHAGOGASTRODUODENOSCOPY (EGD);  Surgeon: Luke Salaam, MD;  Location[facility] ENDOSCOPY;  Service: Gastroenterology;  Laterality: N/A;   HERNIA REPAIR     Inguinal left side   MOUTH SURGERY  09/2016   TUBAL LIGATION      Family History  Problem Relation Age of Onset   Hypothyroidism Mother    Hypertension Mother    Dementia Father    Dementia Paternal Grandmother     Social History   Socioeconomic History   Marital status: Divorced    Spouse name: Not on file   Number of children: Not on file   Years of education: Not on file   Highest education level: GED or equivalent  Occupational History   Not on file  Tobacco Use   Smoking status: Never   Smokeless tobacco: Never  Vaping Use   Vaping status: Never Used  Substance and Sexual Activity   Alcohol use: No   Drug use: No   Sexual activity: Never  Other Topics Concern   Not on file  Social History Narrative   Not on file   Social Drivers of Health   Financial Resource Strain: Low Risk  (03/17/2024)   Overall Financial Resource Strain (CARDIA)    Difficulty of Paying Living Expenses: Not hard at all  Food Insecurity: No Food Insecurity (03/17/2024)   Hunger Vital Sign    Worried About Running Out of Food in the Last Year: Never true    Ran Out of Food in the Last Year: Never true  Transportation Needs: No Transportation Needs (03/17/2024)   PRAPARE - Administrator, Civil Service (Medical): No    Lack of Transportation (Non-Medical): No  Physical Activity: Sufficiently Active (03/17/2024)   Exercise Vital Sign    Days of Exercise per Week: 5 days    Minutes of Exercise per Session: 30 min  Stress: No Stress Concern Present (03/17/2024)   [employer] of Occupational Health - Occupational Stress Questionnaire     Feeling of Stress : Not at all  Social Connections: Moderately Integrated (03/17/2024)   Social Connection and Isolation Panel [NHANES]    Frequency of Communication with Friends and Family: More than three times a week    Frequency of Social Gatherings with Friends and Family: Three times a week    Attends Religious Services: More than 4 times per year    Active Member of Clubs or Organizations: Yes    Attends Banker Meetings: More than 4 times per year    Marital Status: Divorced  Intimate Partner Violence: Not At Risk (12/19/2023)   Humiliation, Afraid, Rape, and Kick questionnaire    Fear of Current or Ex-Partner: No    Emotionally Abused: No    Physically Abused: No    Sexually Abused: No     Outpatient Medications Prior to Visit  Medication Sig Dispense Refill   B Complex-C (SUPER B COMPLEX PO) Take 1 capsule by mouth.     Calcium  Carbonate-Vitamin D  (CALCIUM -VITAMIN D ) 500-200 MG-UNIT per tablet Take 3 tablets by mouth daily.      Coenzyme Q10 (COQ10 PO) Take by mouth. Take one daily     levothyroxine  (SYNTHROID ) 100 MCG tablet Take 1 tablet (100 mcg total) by mouth daily. 30 tablet 0   Magnesium 250 MG TABS Take 400 mg by mouth.     meclizine  (ANTIVERT ) 25 MG tablet Take 0.5-1 tablets (12.5-25 mg total) by mouth 3 (three) times daily as needed for dizziness. 30 tablet 0   Multiple Vitamins-Minerals (PRESERVISION AREDS PO) Take 1 tablet by mouth daily.      Omega-3 Fatty Acids (FISH OIL) 1000 MG CPDR Take 3,000 mg by mouth daily.      Probiotic Product (TRUBIOTICS PO) Take 1 capsule by mouth.     Turmeric (QC TUMERIC COMPLEX) 500 MG CAPS Take 1,000 mg by mouth 2 (two) times daily.     No facility-administered medications prior to visit.    Allergies  Allergen Reactions   Moxifloxacin      Dizziness, nausea, sick on the stomach   Augmentin  [Amoxicillin -Pot Clavulanate] Itching    Hives    Doxycycline      Nausea, sick on the stomach    ROS Review of  Systems Negative unless indicated in HPI.    Objective:     Physical Exam Constitutional:      Appearance: Normal appearance.  Cardiovascular:     Rate and Rhythm: Normal rate and regular rhythm.     Pulses: Normal pulses.     Heart sounds: Normal heart sounds.  Pulmonary:     Effort: Pulmonary effort is normal. No respiratory distress.     Breath sounds: Normal breath sounds. No rhonchi.  Abdominal:     General: Bowel sounds are normal.     Palpations:  Abdomen is soft.     Tenderness: There is no abdominal tenderness. There is no rebound.  Musculoskeletal:     Thoracic back: No swelling.     Lumbar back: Negative right straight leg raise test and negative left straight leg raise test.  Neurological:     General: No focal deficit present.     Mental Status: She is alert and oriented to person, place, and time.  Psychiatric:        Mood and Affect: Mood normal.        Behavior: Behavior normal.     BP 116/78   Pulse 71   Temp (!) 97.4 F (36.3 C)   Ht 5\' 1"  (1.549 m)   Wt 163 lb 9.6 oz (74.2 kg)   SpO2 99%   BMI 30.91 kg/m  Wt Readings from Last 3 Encounters:  04/06/24 163 lb 9.6 oz (74.2 kg)  03/24/24 160 lb 12.8 oz (72.9 kg)  02/26/24 163 lb 9.6 oz (74.2 kg)     Health Maintenance  Topic Date Due   Zoster Vaccines- Shingrix (1 of 2) Never done   INFLUENZA VACCINE  06/11/2024   Medicare Annual Wellness (AWV)  12/18/2024   Pneumonia Vaccine 64+ Years old  Completed   DEXA SCAN  Completed   HPV VACCINES  Aged Out   Meningococcal B Vaccine  Aged Out   DTaP/Tdap/Td  Discontinued   Colonoscopy  Discontinued   COVID-19 Vaccine  Discontinued    There are no preventive care reminders to display for this patient.  Lab Results  Component Value Date   TSH 0.45 03/24/2024   Lab Results  Component Value Date   WBC 4.6 03/24/2024   HGB 13.4 03/24/2024   HCT 40.7 03/24/2024   MCV 90.7 03/24/2024   PLT 186.0 03/24/2024   Lab Results  Component Value Date    NA 139 03/24/2024   K 4.7 03/24/2024   CO2 29 03/24/2024   GLUCOSE 96 03/24/2024   BUN 19 03/24/2024   CREATININE 0.77 03/24/2024   BILITOT 0.4 03/24/2024   ALKPHOS 71 03/24/2024   AST 20 03/24/2024   ALT 17 03/24/2024   PROT 7.0 03/24/2024   ALBUMIN 4.3 03/24/2024   CALCIUM  9.6 03/24/2024   ANIONGAP 7 10/09/2021   EGFR 62 05/24/2022   GFR 72.69 03/24/2024   Lab Results  Component Value Date   CHOL 198 07/31/2022   Lab Results  Component Value Date   HDL 65.90 07/31/2022   Lab Results  Component Value Date   LDLCALC 104 (H) 07/31/2022   Lab Results  Component Value Date   TRIG 140.0 07/31/2022   Lab Results  Component Value Date   CHOLHDL 3 07/31/2022   Lab Results  Component Value Date   HGBA1C 5.7 03/24/2024      Assessment & Plan:  Low back pain, unspecified back pain laterality, unspecified chronicity, unspecified whether sciatica present -     POCT Urinalysis Dipstick (Automated) -     Urine Culture -     Urinalysis, Routine w reflex microscopic    Follow-up: No follow-ups on file.   Cali Hope, NP

## 2024-04-07 ENCOUNTER — Encounter: Payer: Self-pay | Admitting: Nurse Practitioner

## 2024-04-07 LAB — URINALYSIS, ROUTINE W REFLEX MICROSCOPIC
Bilirubin Urine: NEGATIVE
Hgb urine dipstick: NEGATIVE
Ketones, ur: NEGATIVE
Leukocytes,Ua: NEGATIVE
Nitrite: NEGATIVE
RBC / HPF: NONE SEEN (ref 0–?)
Specific Gravity, Urine: 1.01 (ref 1.000–1.030)
Total Protein, Urine: NEGATIVE
Urine Glucose: NEGATIVE
Urobilinogen, UA: 0.2 (ref 0.0–1.0)
WBC, UA: NONE SEEN (ref 0–?)
pH: 7.5 (ref 5.0–8.0)

## 2024-04-07 LAB — URINE CULTURE
MICRO NUMBER:: 16502434
SPECIMEN QUALITY:: ADEQUATE

## 2024-04-07 NOTE — Assessment & Plan Note (Addendum)
 Acute exacerbation of chronic back pain related to spinal stenosis and increased activity. No signs of UTI on the dipstick. No pelvic pain at present. Denise abnormal vaginal discharge, bleeding or urinary symptoms.  -Microscopy and culture pending.  -Recommend alternating heating and cold packs. - Advise against excessive bending. - Suggest topical analgesics such as heating creams and lidocaine  patches. - Consider further evaluation if pain persists and urine culture is negative.

## 2024-04-08 ENCOUNTER — Ambulatory Visit: Payer: Self-pay | Admitting: Nurse Practitioner

## 2024-04-26 DIAGNOSIS — H35373 Puckering of macula, bilateral: Secondary | ICD-10-CM | POA: Diagnosis not present

## 2024-04-26 DIAGNOSIS — H35363 Drusen (degenerative) of macula, bilateral: Secondary | ICD-10-CM | POA: Diagnosis not present

## 2024-04-26 DIAGNOSIS — G4733 Obstructive sleep apnea (adult) (pediatric): Secondary | ICD-10-CM | POA: Diagnosis not present

## 2024-04-26 DIAGNOSIS — H5203 Hypermetropia, bilateral: Secondary | ICD-10-CM | POA: Diagnosis not present

## 2024-04-26 DIAGNOSIS — H40023 Open angle with borderline findings, high risk, bilateral: Secondary | ICD-10-CM | POA: Diagnosis not present

## 2024-05-07 ENCOUNTER — Telehealth: Payer: Self-pay

## 2024-05-07 DIAGNOSIS — E039 Hypothyroidism, unspecified: Secondary | ICD-10-CM

## 2024-05-07 MED ORDER — LEVOTHYROXINE SODIUM 100 MCG PO TABS
100.0000 ug | ORAL_TABLET | Freq: Every day | ORAL | 0 refills | Status: DC
Start: 2024-05-07 — End: 2024-06-07

## 2024-05-07 NOTE — Telephone Encounter (Signed)
 Copied from CRM [id number]. Topic: Clinical - Medication Refill >> May 07, 2024  3:14 PM Suzen RAMAN wrote: Medication: levothyroxine  (SYNTHROID ) 100 MCG tablet Patient would like a 90 day supply  Has the patient contacted their pharmacy? Yes  This is the patient's preferred pharmacy:  [id number] [place], [place] - [address] [place] [address] Phone: [phone] Fax: [phone]  Is this the correct pharmacy for this prescription? Yes If no, delete pharmacy and type the correct one.   Has the prescription been filled recently? No  Is the patient out of the medication? No  Has the patient been seen for an appointment in the last year OR does the patient have an upcoming appointment? Yes  Can we respond through MyChart? Yes  Agent: Please be advised that Rx refills may take up to 3 business days. We ask that you follow-up with your pharmacy.

## 2024-05-20 ENCOUNTER — Ambulatory Visit: Payer: Self-pay

## 2024-05-20 ENCOUNTER — Other Ambulatory Visit (INDEPENDENT_AMBULATORY_CARE_PROVIDER_SITE_OTHER)

## 2024-05-20 DIAGNOSIS — E673 Hypervitaminosis D: Secondary | ICD-10-CM | POA: Diagnosis not present

## 2024-05-20 LAB — VITAMIN D 25 HYDROXY (VIT D DEFICIENCY, FRACTURES): VITD: 64.51 ng/mL (ref 30.00–100.00)

## 2024-05-20 LAB — MAGNESIUM: Magnesium: 2.3 mg/dL (ref 1.5–2.5)

## 2024-05-26 DIAGNOSIS — G4733 Obstructive sleep apnea (adult) (pediatric): Secondary | ICD-10-CM | POA: Diagnosis not present

## 2024-06-02 ENCOUNTER — Ambulatory Visit: Payer: Self-pay

## 2024-06-02 ENCOUNTER — Ambulatory Visit (INDEPENDENT_AMBULATORY_CARE_PROVIDER_SITE_OTHER)

## 2024-06-02 VITALS — BP 138/74 | HR 77 | Temp 98.1°F | Resp 20 | Ht 61.0 in | Wt 163.1 lb

## 2024-06-02 DIAGNOSIS — R052 Subacute cough: Secondary | ICD-10-CM | POA: Diagnosis not present

## 2024-06-02 DIAGNOSIS — R059 Cough, unspecified: Secondary | ICD-10-CM | POA: Diagnosis not present

## 2024-06-02 DIAGNOSIS — J40 Bronchitis, not specified as acute or chronic: Secondary | ICD-10-CM | POA: Diagnosis not present

## 2024-06-02 DIAGNOSIS — I771 Stricture of artery: Secondary | ICD-10-CM | POA: Diagnosis not present

## 2024-06-02 DIAGNOSIS — J189 Pneumonia, unspecified organism: Secondary | ICD-10-CM | POA: Diagnosis not present

## 2024-06-02 MED ORDER — FLUTICASONE PROPIONATE 50 MCG/ACT NA SUSP: 2.0000 | Freq: Every day | NASAL | 6 refills | Status: AC

## 2024-06-02 MED ORDER — GUAIFENESIN-CODEINE 100-10 MG/5ML PO SOLN: 5.0000 mL | Freq: Three times a day (TID) | ORAL | 0 refills | Status: DC | PRN

## 2024-06-02 MED ORDER — AZITHROMYCIN 250 MG PO TABS: ORAL_TABLET | ORAL | 0 refills | Status: AC

## 2024-06-02 NOTE — Telephone Encounter (Signed)
 FYI Only or Action Required?: FYI only for provider.  Patient was last seen in primary care on 04/06/2024 by Vincente Saber, NP.  Called Nurse Triage reporting Cough.  Symptoms began several weeks ago.  Interventions attempted: OTC medications: cough syrups and other medications.  Symptoms are: unchanged.  Triage Disposition: See Physician Within 24 Hours  Patient/caregiver understands and will follow disposition?: Yes, will follow disposition  Copied from CRM #[id number]. Topic: Clinical - Red Word Triage >> Jun 02, 2024  8:25 AM Cathy Coffey wrote: Reason for RMF:dprx for three weeks has congeston, sore throat, tired and coughing up white chunks Reason for Disposition  SEVERE coughing spells (e.g., whooping sound after coughing, vomiting after coughing)  Answer Assessment - Initial Assessment Questions 1. ONSET: When did the cough begin?      About 3 weeks ago 2. SEVERITY: How bad is the cough today?      severe 3. SPUTUM: Describe the color of your sputum (e.g., none, dry cough; clear, white, yellow, green)     White chunks 4. HEMOPTYSIS: Are you coughing up any blood? If Yes, ask: How much? (e.g., flecks, streaks, tablespoons, etc.)     denies 5. DIFFICULTY BREATHING: Are you having difficulty breathing? If Yes, ask: How bad is it? (e.g., mild, moderate, severe)      denies 6. FEVER: Do you have a fever? If Yes, ask: What is your temperature, how was it measured, and when did it start?     denies 10. OTHER SYMPTOMS: Do you have any other symptoms? (e.g., runny nose, wheezing, chest pain)       Pt states that she does not have a runny nose anymore. States that she is fatigued and tired all the time  Protocols used: Cough - Acute Productive-A-AH
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
## 2024-06-02 NOTE — Telephone Encounter (Signed)
 Pt seen in office 06/02/2024 by Dr. Abbey.

## 2024-06-02 NOTE — Patient Instructions (Addendum)
-   Take cough syrup three times a day as needed.  - Please stop taking turmeric tablets to reduce risk of acid reflux.  - Use nasal flonase  2 puffs in each nostril daily for 10 days then as needed.  - I will reach out to you once I have results of your chest x-ray.

## 2024-06-02 NOTE — Assessment & Plan Note (Signed)
 Subacute. Physical exam, vitals reassuring.  D/D bronchitis, pneumonia, pertussis, GERD, asthma, post nasal drip.  Obtain chest x-ray to r/o pneumonia, mass.  Will treat with Azithromycin  for 5 days ( 500 mg (day 1), then 250 mg for 4 days.  Recommend Guaifensin-codeine  100-10 mg/5 ml, three times a day prn (do not exceed 120 mg of codeine  in 24 hours). Patient counseled on s/e including but not limited to sedation, respiratory depression and potential for abuse. PDMP reviewed.  Flonase  nasal spray 2 puffs in each nostril daily for 10 days then prn.  Symptomatic treatment with adequate hydration, rest, honey, d/c supplements discussed.  If symptoms persists despite above treatment recommend f/u in 10 days, if progressively improves prn f/u recommended.

## 2024-06-07 ENCOUNTER — Other Ambulatory Visit: Payer: Self-pay

## 2024-06-07 DIAGNOSIS — E039 Hypothyroidism, unspecified: Secondary | ICD-10-CM

## 2024-06-07 MED ORDER — LEVOTHYROXINE SODIUM 100 MCG PO TABS: 100.0000 ug | ORAL_TABLET | Freq: Every day | ORAL | 3 refills | Status: AC

## 2024-06-07 NOTE — Telephone Encounter (Signed)
 1. Acquired hypothyroidism - levothyroxine  (SYNTHROID ) 100 MCG tablet; Take 1 tablet (100 mcg total) by mouth daily.  Dispense: 90 tablet; Refill: 3  Cathy Klett, MD

## 2024-06-07 NOTE — Telephone Encounter (Signed)
 Copied from CRM [id number]. Topic: Clinical - Medication Refill >> Jun 07, 2024  8:44 AM [name] wrote: Medication: The patient is requesting a 90 day supply of levothyroxine  (SYNTHROID ) 100 MCG tablet.  Has the patient contacted their pharmacy? Yes  This is the patient's preferred pharmacy:  [id number] [place], [place] - [name] [address] Phone: [phone] Fax: [phone]  Is this the correct pharmacy for this prescription? Yes  Has the prescription been filled recently? Yes  Is the patient out of the medication? No, 6 days left.   Has the patient been seen for an appointment in the last year OR does the patient have an upcoming appointment? Yes  Can we respond through MyChart? Yes  Agent: Please be advised that Rx refills may take up to 3 business days. We ask that you follow-up with your pharmacy.

## 2024-06-26 DIAGNOSIS — G4733 Obstructive sleep apnea (adult) (pediatric): Secondary | ICD-10-CM | POA: Diagnosis not present

## 2024-06-28 DIAGNOSIS — M542 Cervicalgia: Secondary | ICD-10-CM | POA: Diagnosis not present

## 2024-06-28 DIAGNOSIS — M5416 Radiculopathy, lumbar region: Secondary | ICD-10-CM | POA: Diagnosis not present

## 2024-06-28 DIAGNOSIS — M9901 Segmental and somatic dysfunction of cervical region: Secondary | ICD-10-CM | POA: Diagnosis not present

## 2024-06-28 DIAGNOSIS — M9903 Segmental and somatic dysfunction of lumbar region: Secondary | ICD-10-CM | POA: Diagnosis not present

## 2024-07-01 DIAGNOSIS — M9901 Segmental and somatic dysfunction of cervical region: Secondary | ICD-10-CM | POA: Diagnosis not present

## 2024-07-01 DIAGNOSIS — M9903 Segmental and somatic dysfunction of lumbar region: Secondary | ICD-10-CM | POA: Diagnosis not present

## 2024-07-01 DIAGNOSIS — M542 Cervicalgia: Secondary | ICD-10-CM | POA: Diagnosis not present

## 2024-07-01 DIAGNOSIS — M5416 Radiculopathy, lumbar region: Secondary | ICD-10-CM | POA: Diagnosis not present

## 2024-07-16 ENCOUNTER — Ambulatory Visit: Payer: Self-pay

## 2024-07-16 ENCOUNTER — Other Ambulatory Visit: Payer: Self-pay

## 2024-07-16 ENCOUNTER — Emergency Department
Admission: EM | Admit: 2024-07-16 | Discharge: 2024-07-16 | Disposition: A | Discharge status: Home / Self Care | Attending: Emergency Medicine | Admitting: Emergency Medicine

## 2024-07-16 DIAGNOSIS — R300 Dysuria: Secondary | ICD-10-CM | POA: Diagnosis present

## 2024-07-16 DIAGNOSIS — I10 Essential (primary) hypertension: Secondary | ICD-10-CM | POA: Diagnosis not present

## 2024-07-16 DIAGNOSIS — N3000 Acute cystitis without hematuria: Secondary | ICD-10-CM | POA: Insufficient documentation

## 2024-07-16 DIAGNOSIS — J45909 Unspecified asthma, uncomplicated: Secondary | ICD-10-CM | POA: Diagnosis not present

## 2024-07-16 DIAGNOSIS — R140 Abdominal distension (gaseous): Secondary | ICD-10-CM | POA: Diagnosis not present

## 2024-07-16 LAB — URINALYSIS, ROUTINE W REFLEX MICROSCOPIC
Bilirubin Urine: NEGATIVE
Glucose, UA: NEGATIVE mg/dL
Ketones, ur: NEGATIVE mg/dL
Nitrite: NEGATIVE
Protein, ur: NEGATIVE mg/dL
Specific Gravity, Urine: 1.009 (ref 1.005–1.030)
pH: 6 (ref 5.0–8.0)

## 2024-07-16 LAB — CBC
HCT: 40.7 % (ref 36.0–46.0)
Hemoglobin: 13.1 g/dL (ref 12.0–15.0)
MCH: 29.6 pg (ref 26.0–34.0)
MCHC: 32.2 g/dL (ref 30.0–36.0)
MCV: 92.1 fL (ref 80.0–100.0)
Platelets: 193 K/uL (ref 150–400)
RBC: 4.42 MIL/uL (ref 3.87–5.11)
RDW: 13.5 % (ref 11.5–15.5)
WBC: 9.7 K/uL (ref 4.0–10.5)
nRBC: 0 % (ref 0.0–0.2)

## 2024-07-16 LAB — COMPREHENSIVE METABOLIC PANEL WITH GFR
ALT: 16 U/L (ref 0–44)
AST: 23 U/L (ref 15–41)
Albumin: 4.2 g/dL (ref 3.5–5.0)
Alkaline Phosphatase: 69 U/L (ref 38–126)
Anion gap: 12 (ref 5–15)
BUN: 32 mg/dL — ABNORMAL HIGH (ref 8–23)
CO2: 25 mmol/L (ref 22–32)
Calcium: 9.2 mg/dL (ref 8.9–10.3)
Chloride: 103 mmol/L (ref 98–111)
Creatinine, Ser: 0.81 mg/dL (ref 0.44–1.00)
GFR, Estimated: >60 mL/min (ref >60)
Glucose, Bld: 101 mg/dL — ABNORMAL HIGH (ref 70–99)
Potassium: 4.8 mmol/L (ref 3.5–5.1)
Sodium: 140 mmol/L (ref 135–145)
Total Bilirubin: 0.6 mg/dL (ref 0.0–1.2)
Total Protein: 6.7 g/dL (ref 6.5–8.1)

## 2024-07-16 MED ORDER — SULFAMETHOXAZOLE-TRIMETHOPRIM 800-160 MG PO TABS: 1.0000 | ORAL_TABLET | Freq: Two times a day (BID) | ORAL | 0 refills | Status: AC

## 2024-07-16 NOTE — ED Notes (Signed)
 Patient declined discharge vital signs. Patient ambulated from the department with a steady gait.

## 2024-07-16 NOTE — ED Triage Notes (Signed)
 C/O suprapubic pressure, hematuria x 10 days.  Reports that urine has been darker than normal. Presented to [facility] today for symptoms and mentioned that she felt light headed, referred to ED.  Had similar symptoms in December/January and treated for UTI.  AAOx3.  Skin warm and dry. NAD

## 2024-07-16 NOTE — ED Provider Notes (Signed)
 [facility] Provider Note    Event Date/Time   First MD Initiated Contact with Patient 07/16/24 1019     (approximate)   History   Dysuria   HPI  Cathy Coffey is a 83 y.o. female with history of hypertension, IBS, GERD, hyperlipidemia, asthma and as listed in EMR presents to the emergency department for treatment and evaluation of suprapubic pressure and hematuria for the past 10 days.  Urine has been darker than usual.  She had gone to [place] [facility] today to be evaluated but because she reported one of her symptoms was feeling lightheaded they referred her to the emergency department.  Similar symptoms in December or January and she was treated for UTI.  She is also feeling bloating in the upper abdomen.  No nausea, vomiting, diarrhea, or constipation.     Physical Exam    Vitals:   07/16/24 0916  BP: (!) 140/87  Pulse: 84  Resp: 16  Temp: 98.3 F (36.8 C)  SpO2: 96%    General: Awake, no distress.  CV:  Good peripheral perfusion.  Resp:  Normal effort.  Abd:  No distention.  Soft.  Tenderness elicited with palpation over the suprapubic area.  Bowel sounds present and active x 4 quadrants. Other:     ED Results / Procedures / Treatments   Labs (all labs ordered are listed, but only abnormal results are displayed)  Labs Reviewed  COMPREHENSIVE METABOLIC PANEL WITH GFR - Abnormal; Notable for the following components:      Result Value   Glucose, Bld 101 (*)    BUN 32 (*)    All other components within normal limits  URINALYSIS, ROUTINE W REFLEX MICROSCOPIC - Abnormal; Notable for the following components:   Color, Urine YELLOW (*)    APPearance CLEAR (*)    Hgb urine dipstick LARGE (*)    Leukocytes,Ua TRACE (*)    Bacteria, UA RARE (*)    All other components within normal limits  URINE CULTURE  CBC     EKG  Not indicated   RADIOLOGY  Image and radiology report reviewed and interpreted by me. Radiology report  consistent with the same.  Not indicated  PROCEDURES:  Critical Care performed: No  Procedures   MEDICATIONS ORDERED IN ED:  Medications - No data to display   IMPRESSION / MDM / ASSESSMENT AND PLAN / ED COURSE   I have reviewed the triage note and vital signs. Vital signs are stable.  No sepsis criteria   Differential diagnosis includes, but is not limited to, acute cystitis, pyelonephritis, interstitial cystitis, intra-abdominal infection  Patient's presentation is most consistent with acute illness / injury with system symptoms.  83 year old female presenting to the emergency department for treatment and evaluation of suprapubic tenderness, hematuria that has been present for the past 10 days and seems like it is getting worse.  See HPI for further details.  Exam is reassuring.  Labs show normal CBC.  CMP shows a BUN of 32 with a normal creatinine of 0.8 and a GFR of greater than 60.  Urinalysis shows large amount of hemoglobin, trace leukocytes, 21-50 red blood cells, 11-20 white blood cells, and rare bacteria with white blood cell clumps and mucus present.  Results were discussed with the patient.  Urine culture results were reviewed from November 2024.  Serratia marcenscens grew and was resistant to amoxicillin , cefazolin, nitrofurantoin .  Patient is allergic to moxifloxacin  and therefore cannot take levofloxacin  or ciprofloxacin therefore outpatient  treatment will be Bactrim .  She has no history of chronic kidney disease.   For her chronic upper abdominal bloating sensation it was recommended that she follow-up with gastroenterology.  Outpatient follow-up to ensure her UTI has cleared was discussed as well as ER return precautions and she was discharged home in stable condition.      FINAL CLINICAL IMPRESSION(S) / ED DIAGNOSES   Final diagnoses:  Acute cystitis without hematuria  Generalized bloating     Rx / DC Orders   ED Discharge Orders          Ordered     sulfamethoxazole -trimethoprim  (BACTRIM  DS) 800-160 MG tablet  2 times daily        07/16/24 1109             Note:  This document was prepared using Dragon voice recognition software and may include unintentional dictation errors.   Herlinda Kirk NOVAK, FNP 07/16/24 1728    Willo Dunnings, MD 07/16/24 1911

## 2024-07-16 NOTE — Discharge Instructions (Addendum)
 Please follow-up with your primary care provider for repeat urinalysis in about 2 weeks.  Schedule a follow-up appointment with gastroenterology for intermittent abdominal bloating.  Return to the emergency department for any symptoms of concern if unable to schedule appointment with either your primary care provider or the specialist.

## 2024-07-16 NOTE — Telephone Encounter (Signed)
 FYI Only or Action Required?: FYI only for provider.  Patient was last seen in primary care on 06/02/2024 by Abbey Bruckner, MD.  Called Nurse Triage reporting Hematuria.  Symptoms began today.  Interventions attempted: Nothing.  Symptoms are: gradually worsening.  Triage Disposition: Go to ED Now (Notify PCP)  Patient/caregiver understands and will follow disposition?: Yes      Copied from CRM [id number]. Topic: Clinical - Red Word Triage >> Jul 16, 2024  7:38 AM Turkey A wrote: [facility] that prompted transfer to Nurse Triage: Patient has UTI and has blood in Urine. Reason for Disposition  Passing pure blood or large blood clots (i.e., size > a dime)  (Exception: Fleck or small strands.)  Answer Assessment - Initial Assessment Questions 1. COLOR of URINE: Describe the color of the urine.  (e.g., tea-colored, pink, red, bloody) Do you have blood clots in your urine? (e.g., none, pea, grape, small coin)     Small blood clot that was real brown. States urine is a dark color. 2. ONSET: When did the bleeding start?      This am 3. EPISODES: How many times has there been blood in the urine? or How many times today?     3 4. PAIN with URINATION: Is there any pain with passing your urine? If Yes, ask: How bad is the pain?  (Scale 1-10; or mild, moderate, severe)     States pressure in area 5. FEVER: Do you have a fever? If Yes, ask: What is your temperature, how was it measured, and when did it start?     no 6. ASSOCIATED SYMPTOMS: Are you passing urine more frequently than usual?     yes 7. OTHER SYMPTOMS: Do you have any other symptoms? (e.g., back/flank pain, abdomen pain, vomiting)     fatigue 8. PREGNANCY: Is there any chance you are pregnant? When was your last menstrual period?     na  Protocols used: Urine - Blood In-A-AH

## 2024-07-18 LAB — URINE CULTURE: Culture: >=100000 — AB

## 2024-07-20 ENCOUNTER — Ambulatory Visit (INDEPENDENT_AMBULATORY_CARE_PROVIDER_SITE_OTHER)

## 2024-07-20 VITALS — BP 104/78 | HR 79 | Temp 98.5°F | Ht 61.0 in | Wt 163.2 lb

## 2024-07-20 DIAGNOSIS — R159 Full incontinence of feces: Secondary | ICD-10-CM | POA: Insufficient documentation

## 2024-07-20 DIAGNOSIS — R1084 Generalized abdominal pain: Secondary | ICD-10-CM

## 2024-07-20 DIAGNOSIS — N3946 Mixed incontinence: Secondary | ICD-10-CM | POA: Insufficient documentation

## 2024-07-20 MED ORDER — SUCRALFATE 1 G PO TABS
1.0000 g | ORAL_TABLET | Freq: Two times a day (BID) | ORAL | 0 refills | Status: DC
Start: 2024-07-20 — End: 2024-08-06

## 2024-07-20 NOTE — Patient Instructions (Addendum)
--   Please hold off on taking Omeprazole, Pantoprazole  for now. I recommend you start Carafate  twice a day to help with stomach acid. You can stop taking this if stomach pain improves and when diarrhea is fully resolved. Taking antibiotic with Omeprazole group of medication can put one at risk of having C. Dif infection which can cause severe diarrhea. If your diarrhea were to worsen you should be seen in the ED.  -- I am referring you to Dr. Therisa (GI doctor, he is now with [facility]), if you do not hear from GI to schedule an appointment within 2 weeks please let our office know.  -- I am also referring you to urologist and pelvic floor therapy to help strengthen pelvic muscle and evaluate into recurrent UTI.

## 2024-07-20 NOTE — Assessment & Plan Note (Signed)
 Risk factors include postmenopausal status and urinary incontinence.  Currently on treatment for culture positive UTI. Discussed potential benefit of pelvic floor therapy, vaginal estrogen cream. Refer to urologist for evaluation. Referral to PT for pelvic floor therapy.

## 2024-07-20 NOTE — Assessment & Plan Note (Signed)
 Acute on chronic. Predominantly constipation but loose BM as she is being treated for UTI. Physical exam, vitals reassuring.  Recent antibiotic use increases C. diff risk, contraindicating omeprazole/pantoprazole  for now due to potential diarrhea exacerbation. Carafate  1 gm BID for 2 weeks.  Refer to gastroenterologist for further evaluation. Previous patient of Dr. Therisa, prefers to see Dr. Therisa at [place].

## 2024-07-20 NOTE — Assessment & Plan Note (Signed)
 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 Patient used to see Dr. Therisa before he moved to [facility] clinic and is looking to establish care with him. Urgent referral made to Dr. Therisa.   Monitor bowel movement frequency and consistency. If worsening diarrhea recommend emergent evaluation.
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
## 2024-07-27 DIAGNOSIS — G4733 Obstructive sleep apnea (adult) (pediatric): Secondary | ICD-10-CM | POA: Diagnosis not present

## 2024-07-28 DIAGNOSIS — G4733 Obstructive sleep apnea (adult) (pediatric): Secondary | ICD-10-CM | POA: Diagnosis not present

## 2024-08-02 ENCOUNTER — Inpatient Hospital Stay

## 2024-08-05 ENCOUNTER — Ambulatory Visit: Payer: Self-pay

## 2024-08-05 NOTE — Telephone Encounter (Signed)
 FYI Only or Action Required?: FYI only for provider.  Patient was last seen in primary care on 07/20/2024 by Abbey Bruckner, MD.  Called Nurse Triage reporting Fatigue, pt thinks this may be a UTI.  Symptoms began several days ago.  Interventions attempted: Nothing.  Symptoms are: gradually worsening.  Triage Disposition: See Physician Within 24 Hours  Patient/caregiver understands and will follow disposition?: Yes                         Copied from CRM [id number]. Topic: Clinical - Red Word Triage >> Aug 05, 2024  9:45 AM Rea BROCKS wrote: Red Word that prompted transfer to Nurse Triage: Severe fatigue- patient doesn't know what is causing it. She is taking magnesium but doesn't know. It's affecting everyday living. Patient is not in pain- just lethargic. She sits down and goes to sleep. Not sure what is the root cause. Patient states she has sleep apnea and is sleeping well- still severe fatigue. Reason for Disposition  [1] MODERATE weakness (e.g., interferes with work, school, normal activities) AND [2] persists > 3 days  Answer Assessment - Initial Assessment Questions 1. DESCRIPTION: Describe how you are feeling.     No energy, fatigue 2. SEVERITY: How bad is it?  Can you stand and walk?     Just wants to sit down 3. ONSET: When did these symptoms begin? (e.g., hours, days, weeks, months)     A couple of weeks 4. CAUSE: What do you think is causing the weakness or fatigue? (e.g., not drinking enough fluids, medical problem, trouble sleeping)     Unsure - possible UTI 5. NEW MEDICINES:  Have you started on any new medicines recently? (e.g., opioid pain medicines, benzodiazepines, muscle relaxants, antidepressants, antihistamines, neuroleptics, beta blockers)     no 6. OTHER SYMPTOMS: Do you have any other symptoms? (e.g., chest pain, fever, cough, SOB, vomiting, diarrhea, bleeding, other areas of pain)     Nothing other than fatigue  Protocols  used: Weakness (Generalized) and Fatigue-A-AH

## 2024-08-05 NOTE — Progress Notes (Unsigned)
 Acute visit  Patient: Cathy Coffey   DOB: 18-Feb-1941   83 y.o. Female  MRN: [id number] PCP: Abbey Bruckner, MD   Chief Complaint  Patient presents with   Fatigue    Ongoing 3-4 weeks worse last 2 weeks. Pt was put on medication for ulcer, states not able to eat normal   Subjective     Fatigue: - has been going on for several weeks - reports little energy  - has a granddaughter that lives with her and she has noticed that patient has lower energy than normal - doesn't have energy to water her plants - has been on medication for a stomach ulcer, epigastric pain has mostly improved - takes a lot of vitamins -> takes multiple supplements with magnesium - reports spasm at site of her thyroid  - worried that she may have a UTI, but denies urinary symptoms - recently treated with Bactrim  for UTI - vit D and magnesium have been high in the past - reports that mental health has been good  Denies fevers, chills, weight loss, chest pain, shortness of breath, vomiting, diarrhea( resolved since stopping antibiotic).  Review of systems as noted in HPI.   Objective    BP 104/70   Pulse (!) 56   Resp 14   Ht 5' 1 (1.549 m)   Wt 163 lb 6.4 oz (74.1 kg)   BMI 30.87 kg/m  Physical Exam Constitutional:      Appearance: Normal appearance.  HENT:     Head: Normocephalic and atraumatic.     Mouth/Throat:     Mouth: Mucous membranes are moist.  Eyes:     Pupils: Pupils are equal, round, and reactive to light.  Cardiovascular:     Rate and Rhythm: Normal rate and regular rhythm.     Heart sounds: Normal heart sounds.  Pulmonary:     Effort: Pulmonary effort is normal.     Breath sounds: Normal breath sounds.  Abdominal:     General: Abdomen is flat. Bowel sounds are normal.     Palpations: Abdomen is soft.     Tenderness: There is no abdominal tenderness.  Skin:    General: Skin is warm.  Neurological:     General: No focal deficit present.     Mental Status: She is  alert.     Wt Readings from Last 3 Encounters:  08/06/24 163 lb 6.4 oz (74.1 kg)  07/20/24 163 lb 3.2 oz (74 kg)  07/16/24 163 lb 2.3 oz (74 kg)     No results found for any visits on 08/06/24.  Assessment & Plan     Problem List Items Addressed This Visit       Other   Other fatigue - Primary   Patient is experiencing acute on chronic fatigue for the last 2 weeks. Recently finished a course of Bactrim  for UTI and had concurrent diarrhea which has now resolved. Denies urinary symptoms at this time. Exam reassuring. Per chart review, patient has reported chronic fatigue previously. DDx is broad and includes vitamin D , B12, iron deficiency, UTI, electrolyte imbalance, thyroid  dysfunction, anemia,  dehydration, depression (although reports mental health is stable at this time), worsening OSA.  - Will obtain lab work and urine as below - Consider new sleep study - Recommend patient follow up with PCP      Relevant Orders   TSH   Comprehensive metabolic panel with GFR   CBC   Magnesium   B12   VITAMIN  D 25 Hydroxy (Vit-D Deficiency, Fractures)   Iron, TIBC and Ferritin Panel   Urine Culture   No orders of the defined types were placed in this encounter.    No follow-ups on file.      Isaiah DELENA Pepper, MD  [facility] [phone] (phone) [phone] (fax)

## 2024-08-06 ENCOUNTER — Ambulatory Visit (INDEPENDENT_AMBULATORY_CARE_PROVIDER_SITE_OTHER)

## 2024-08-06 VITALS — BP 104/70 | HR 56 | Resp 14 | Ht 61.0 in | Wt 163.4 lb

## 2024-08-06 DIAGNOSIS — R5383 Other fatigue: Secondary | ICD-10-CM

## 2024-08-06 NOTE — Assessment & Plan Note (Addendum)
 Patient is experiencing acute on chronic fatigue for the last 2 weeks. Recently finished a course of Bactrim  for UTI and had concurrent diarrhea which has now resolved. Denies urinary symptoms at this time. Exam reassuring. Per chart review, patient has reported chronic fatigue previously. DDx is broad and includes vitamin D , B12, iron deficiency, UTI, electrolyte imbalance, thyroid  dysfunction, anemia,  dehydration, depression (although reports mental health is stable at this time), worsening OSA.  - Will obtain lab work and urine as below - Consider new sleep study - Recommend patient follow up with PCP

## 2024-08-07 LAB — COMPREHENSIVE METABOLIC PANEL WITH GFR
ALT: 16 IU/L (ref 0–32)
AST: 19 IU/L (ref 0–40)
Albumin: 4.2 g/dL (ref 3.7–4.7)
Alkaline Phosphatase: 92 IU/L (ref 48–129)
BUN/Creatinine Ratio: 33 — ABNORMAL HIGH (ref 12–28)
BUN: 23 mg/dL (ref 8–27)
Bilirubin Total: <0.2 mg/dL (ref 0.0–1.2)
CO2: 25 mmol/L (ref 20–29)
Calcium: 9.7 mg/dL (ref 8.7–10.3)
Chloride: 102 mmol/L (ref 96–106)
Creatinine, Ser: 0.7 mg/dL (ref 0.57–1.00)
Globulin, Total: 2.2 g/dL (ref 1.5–4.5)
Glucose: 89 mg/dL (ref 70–99)
Potassium: 5.2 mmol/L (ref 3.5–5.2)
Sodium: 139 mmol/L (ref 134–144)
Total Protein: 6.4 g/dL (ref 6.0–8.5)
eGFR: 87 mL/min/1.73 (ref >59)

## 2024-08-07 LAB — TSH: TSH: 1.77 u[IU]/mL (ref 0.450–4.500)

## 2024-08-07 LAB — MAGNESIUM: Magnesium: 2.8 mg/dL — ABNORMAL HIGH (ref 1.6–2.3)

## 2024-08-07 LAB — CBC
Hematocrit: 41.7 % (ref 34.0–46.6)
Hemoglobin: 13 g/dL (ref 11.1–15.9)
MCH: 30.1 pg (ref 26.6–33.0)
MCHC: 31.2 g/dL — ABNORMAL LOW (ref 31.5–35.7)
MCV: 97 fL (ref 79–97)
Platelets: 196 x10E3/uL (ref 150–450)
RBC: 4.32 x10E6/uL (ref 3.77–5.28)
RDW: 13.2 % (ref 11.7–15.4)
WBC: 6.2 x10E3/uL (ref 3.4–10.8)

## 2024-08-07 LAB — VITAMIN D 25 HYDROXY (VIT D DEFICIENCY, FRACTURES): Vit D, 25-Hydroxy: 59.2 ng/mL (ref 30.0–100.0)

## 2024-08-07 LAB — VITAMIN B12: Vitamin B-12: 805 pg/mL (ref 232–1245)

## 2024-08-08 LAB — URINE CULTURE: Organism ID, Bacteria: NO GROWTH

## 2024-08-09 ENCOUNTER — Ambulatory Visit: Payer: Self-pay

## 2024-09-24 ENCOUNTER — Ambulatory Visit

## 2024-09-24 VITALS — BP 104/70 | HR 69 | Temp 98.3°F | Ht 60.0 in | Wt 162.8 lb

## 2024-09-24 DIAGNOSIS — J301 Allergic rhinitis due to pollen: Secondary | ICD-10-CM

## 2024-09-24 DIAGNOSIS — H1013 Acute atopic conjunctivitis, bilateral: Secondary | ICD-10-CM

## 2024-09-24 DIAGNOSIS — E66811 Obesity, class 1: Secondary | ICD-10-CM

## 2024-09-24 DIAGNOSIS — E039 Hypothyroidism, unspecified: Secondary | ICD-10-CM

## 2024-09-24 DIAGNOSIS — G4733 Obstructive sleep apnea (adult) (pediatric): Secondary | ICD-10-CM

## 2024-09-24 DIAGNOSIS — R350 Frequency of micturition: Secondary | ICD-10-CM

## 2024-09-24 DIAGNOSIS — E875 Hyperkalemia: Secondary | ICD-10-CM

## 2024-09-24 LAB — COMPREHENSIVE METABOLIC PANEL WITH GFR
ALT: 13 U/L (ref 0–35)
AST: 17 U/L (ref 0–37)
Albumin: 4.2 g/dL (ref 3.5–5.2)
Alkaline Phosphatase: 61 U/L (ref 39–117)
BUN: 33 mg/dL — ABNORMAL HIGH (ref 6–23)
CO2: 31 meq/L (ref 19–32)
Calcium: 9.3 mg/dL (ref 8.4–10.5)
Chloride: 105 meq/L (ref 96–112)
Creatinine, Ser: 0.86 mg/dL (ref 0.40–1.20)
GFR: 63.44 mL/min (ref >60.00)
Glucose, Bld: 86 mg/dL (ref 70–99)
Potassium: 5.3 meq/L — ABNORMAL HIGH (ref 3.5–5.1)
Sodium: 142 meq/L (ref 135–145)
Total Bilirubin: 0.3 mg/dL (ref 0.2–1.2)
Total Protein: 6.3 g/dL (ref 6.0–8.3)

## 2024-09-24 LAB — MAGNESIUM: Magnesium: 2.5 mg/dL (ref 1.5–2.5)

## 2024-09-24 MED ORDER — OLOPATADINE HCL 0.2 % OP SOLN
1.0000 [drp] | Freq: Every day | OPHTHALMIC | 2 refills | Status: AC | PRN
Start: 2024-09-24 — End: ?

## 2024-09-24 NOTE — Assessment & Plan Note (Addendum)
 Chronic. Plan per allergic conjunctivitis.

## 2024-09-24 NOTE — Patient Instructions (Addendum)
-   Use Flonase  one puff in each nostril daily.  - Use Pataday eye drops, one drop in each eye daily for allergy. If this is not covered by the insurance please get this over the counter.   - Recommend using CPAP every night. Let me know if you want to establish care with a sleep specialist.   - Diet: Emphasize whole grains, lean proteins, fruits, and vegetables. Limit processed foods and sugary drinks. Exercise: Aim for 150 minutes of moderate aerobic activity weekly plus strength training twice a week. Weight Loss: Target 5%

## 2024-09-24 NOTE — Assessment & Plan Note (Signed)
 Chronic urinary urgency, frequency with nocturia. Referral to urologist and pelvic floor therapy pending. She is asymptomatic today. I also discussed reducing water intake after 5 PM, benefit of pelvic floor therapy, potential vaginal estrogen use. She prefers to follow up with urology for her upcoming appointment.

## 2024-09-24 NOTE — Assessment & Plan Note (Addendum)
 Chronic allergic rhinitis and conjunctivitis with seasonal exacerbations managed with nasal felons.  Use Flonase  nasal spray daily, one puff per nostril daily.  Prescribed Pataday eye drops, one drop in each eye daily as needed. Advised to discuss insurance coverage for Pataday with pharmacist. Consider allergy specialist or eye doctor if symptoms persist. Orders:   Olopatadine HCl 0.2 % SOLN; Apply 1 drop to eye daily as needed.

## 2024-09-24 NOTE — Assessment & Plan Note (Addendum)
 Noted during her last lab in 07/2024. Anticipate improvement since patient has cut down on intake of magnesium. She is asymptomatic. Recommend we check CMP and serum magnesium today.  Orders:   Magnesium   Comp Met (CMET)

## 2024-09-24 NOTE — Progress Notes (Signed)
 Established Patient Office Visit   Subjective  Patient ID: Cathy Coffey, female    DOB: 12-Jan-1941  Age: 83 y.o. MRN: [id number]  Chief Complaint  Patient presents with   Fatigue   Urinary Incontinence    Discussed the use of AI scribe software for clinical note transcription with the patient, who gave verbal consent to proceed.  History of Present Illness Cathy Coffey is an 83 year old female with sleep apnea who presents for follow up on fatigue and allergy management.  - OSA, using CPAP:  She has a history of sleep apnea diagnosed approximately 20 years ago and uses a CPAP machine regularly, noting improvement in her symptoms with its use. She recently acquired a new CPAP machine after a repeat sleep study last year. Although she experiences better sleep quality with the CPAP, she occasionally skips its use due to severe allergies causing nasal congestion. She is not seeing a sleep specialist and is not interested in referral to one at this time.    - Fatigue, high magnesium: She experiences fatigue, which she partly attributes to aging. Was seen for fatigue on 08/06/24 with Dr. Franchot, during that visit urine culture was negative, Vitamin D , B12 level was normal.  Her magnesium was mildly elevated. CBC was normal. CMP was reassuring. TSH was normal as well. She reports  Previously, she took two magnesium citrate pills daily but reduced to one (250 mg daily) after noticing elevated magnesium levels in her blood work. She feels less tired since reducing the magnesium dose.   - IBS:  Intermittent constipation relieved with Magnesium citrate and prn senna. Has appointment with GI/Dr. Therisa in 12/2024, was Dr. Williemae patient before he relocated to [facility] clinic. She experiences bloating and has a history of stool incontinence. No new GI concern today.   - Non seasonal allergic rhinitis, conjunctivitis: She suffers from seasonal allergies, with symptoms including severe itchy eyes and  nasal congestion. She uses Claritin, ibuprofen, and Flonase  nasal spray as needed, particularly after exposure to allergens like yard work or visiting her daughter-in-law who has cats. She also uses a neti pot and washes thoroughly after allergen exposure.  - Mixed urge/stress urinary incontinence:  She experiences urinary urgency and incontinence, particularly at night, requiring a bedside commode. She has an upcoming appointment with a urologist and has been referred for pelvic floor therapy, which she has not yet started. She denies worsening urinary symptoms today. Since her symptoms are intermittent in nature she is interested in seeing urology but unsure if they are able to help her with urinary concerns at this time.   - She takes daily synthroid  100 mcg for hypothyroidism. She is slowly getting back to exercising.     ROS As per HPI    Objective:     BP 104/70 (BP Location: Right Arm, Patient Position: Sitting, Cuff Size: Normal)   Pulse 69   Temp 98.3 F (36.8 C) (Oral)   Ht 5' (1.524 m)   Wt 162 lb 12.8 oz (73.8 kg)   SpO2 97%   BMI 31.79 kg/m      09/24/2024    8:59 AM 07/20/2024    4:20 PM 06/02/2024    9:14 AM  Depression screen PHQ 2/9  Decreased Interest 0 0 0  Down, Depressed, Hopeless 0 0 0  PHQ - 2 Score 0 0 0  Altered sleeping 0 0 3  Tired, decreased energy 0 3 0  Change in appetite 0 2 0  Feeling bad or failure about yourself  0 0 0  Trouble concentrating 0 0 0  Moving slowly or fidgety/restless 0 0 0  Suicidal thoughts 0 0 0  PHQ-9 Score 0 5  3   Difficult doing work/chores Not difficult at all Not difficult at all Somewhat difficult     Data saved with a previous flowsheet row definition      09/24/2024    8:59 AM 07/20/2024    4:20 PM 06/02/2024    9:15 AM 04/06/2024    1:13 PM  GAD 7 : Generalized Anxiety Score  Nervous, Anxious, on Edge 0 0 0 0  Control/stop worrying 0 0 0 0  Worry too much - different things 0 0 0 0  Trouble relaxing 0 0 0 0   Restless 0 0 0 0  Easily annoyed or irritable 0 0 0 0  Afraid - awful might happen 0 0 0 0  Total GAD 7 Score 0 0 0 0  Anxiety Difficulty Not difficult at all Not difficult at all Not difficult at all Not difficult at all      09/24/2024    8:59 AM 07/20/2024    4:20 PM 06/02/2024    9:14 AM  Depression screen PHQ 2/9  Decreased Interest 0 0 0  Down, Depressed, Hopeless 0 0 0  PHQ - 2 Score 0 0 0  Altered sleeping 0 0 3  Tired, decreased energy 0 3 0  Change in appetite 0 2 0  Feeling bad or failure about yourself  0 0 0  Trouble concentrating 0 0 0  Moving slowly or fidgety/restless 0 0 0  Suicidal thoughts 0 0 0  PHQ-9 Score 0 5  3   Difficult doing work/chores Not difficult at all Not difficult at all Somewhat difficult     Data saved with a previous flowsheet row definition      09/24/2024    8:59 AM 07/20/2024    4:20 PM 06/02/2024    9:15 AM 04/06/2024    1:13 PM  GAD 7 : Generalized Anxiety Score  Nervous, Anxious, on Edge 0 0 0 0  Control/stop worrying 0 0 0 0  Worry too much - different things 0 0 0 0  Trouble relaxing 0 0 0 0  Restless 0 0 0 0  Easily annoyed or irritable 0 0 0 0  Afraid - awful might happen 0 0 0 0  Total GAD 7 Score 0 0 0 0  Anxiety Difficulty Not difficult at all Not difficult at all Not difficult at all Not difficult at all   SDOH Screenings   Food Insecurity: No Food Insecurity (03/17/2024)  Housing: Unknown (07/16/2024)   Received from [facility]  Transportation Needs: No Transportation Needs (03/17/2024)  Utilities: Not At Risk (12/19/2023)  Alcohol Screen: Low Risk  (12/19/2023)  Depression (PHQ2-9): Low Risk  (09/24/2024)  Recent Concern: Depression (PHQ2-9) - Medium Risk (07/20/2024)  Financial Resource Strain: Low Risk  (03/17/2024)  Physical Activity: Sufficiently Active (03/17/2024)  Social Connections: Moderately Integrated (03/17/2024)  Stress: No Stress Concern Present (03/17/2024)  Tobacco Use: Low Risk  (09/24/2024)   Health Literacy: Adequate Health Literacy (12/19/2023)     Physical Exam Constitutional:      Appearance: She is obese.  HENT:     Head: Normocephalic and atraumatic.     Right Ear: Tympanic membrane normal. There is no impacted cerumen.     Left Ear: Tympanic membrane normal. There is no impacted  cerumen.     Mouth/Throat:     Mouth: Mucous membranes are moist.  Eyes:     Comments: B/L pupils equal and reactive, no eyelids swelling, crusting, lesions. Conjunctiva b/l appears pale with fine papillae on the tarsal conjunctive left slightly worse than right. No follicles/discharge at this time. No photophobia.   Cardiovascular:     Rate and Rhythm: Normal rate.  Pulmonary:     Effort: Pulmonary effort is normal.     Breath sounds: Normal breath sounds.  Abdominal:     General: Bowel sounds are normal.     Palpations: Abdomen is soft.     Tenderness: There is no abdominal tenderness. There is no guarding or rebound.  Musculoskeletal:     Cervical back: Neck supple. No tenderness.     Right lower leg: No edema.     Left lower leg: No edema.  Lymphadenopathy:     Cervical: No cervical adenopathy.  Skin:    General: Skin is warm.  Neurological:     Mental Status: She is alert and oriented to person, place, and time.     Gait: Gait normal.  Psychiatric:        Mood and Affect: Mood normal.        No results found for any visits on 09/24/24.  The ASCVD Risk score (Arnett DK, et al., 2019) failed to calculate for the following reasons:   The 2019 ASCVD risk score is only valid for ages 60 to 53     Assessment & Plan:   Assessment & Plan Allergic conjunctivitis of both eyes Chronic allergic rhinitis and conjunctivitis with seasonal exacerbations managed with nasal felons.  Use Flonase  nasal spray daily, one puff per nostril daily.  Prescribed Pataday eye drops, one drop in each eye daily as needed. Advised to discuss insurance coverage for Pataday with  pharmacist. Consider allergy specialist or eye doctor if symptoms persist. Orders:   Olopatadine HCl 0.2 % SOLN; Apply 1 drop to eye daily as needed.  Acquired hypothyroidism Reviewed TSH/normal from 07/2024. Continue Levothyroxine  100 mcg daily.     Non-seasonal allergic rhinitis due to pollen Chronic. Plan per allergic conjunctivitis.     High magnesium levels Noted during her last lab in 07/2024. Anticipate improvement since patient has cut down on intake of magnesium. She is asymptomatic. Recommend we check CMP and serum magnesium today.  Orders:   Magnesium   Comp Met (CMET)  OSA (obstructive sleep apnea) Compliant with CPAP, per patient. Recommend compliance/use nightly and offered referral to sleep specialist for a follow up. I also discussed that I do not manage CPAP so if she needs CPAP supplies she will have to be referred to sleep specialist. Patient understands this and will reach out to us  when she decides on referral.     Urine frequency Chronic urinary urgency, frequency with nocturia. Referral to urologist and pelvic floor therapy pending. She is asymptomatic today. I also discussed reducing water intake after 5 PM, benefit of pelvic floor therapy, potential vaginal estrogen use. She prefers to follow up with urology for her upcoming appointment. Obesity (BMI 30.0-34.9) Diet: Emphasize whole grains, lean proteins, fruits, and vegetables. Limit processed foods and sugary drinks. Exercise: Aim for 150 minutes of moderate aerobic activity weekly plus strength training twice a week. Weight Loss: Target 5%        Return in about 6 months (around 03/24/2025) for Chronic follow up (hypothyroidism, urinary, gi, obesity).   Luke Shade, MD

## 2024-09-24 NOTE — Assessment & Plan Note (Signed)
 Diet: Emphasize whole grains, lean proteins, fruits, and vegetables. Limit processed foods and sugary drinks.  Exercise: Aim for 150 minutes of moderate aerobic activity weekly plus strength training twice a week. Weight Loss: Target 5%

## 2024-09-24 NOTE — Assessment & Plan Note (Addendum)
 Compliant with CPAP, per patient. Recommend compliance/use nightly and offered referral to sleep specialist for a follow up. I also discussed that I do not manage CPAP so if she needs CPAP supplies she will have to be referred to sleep specialist. Patient understands this and will reach out to us  when she decides on referral.

## 2024-09-24 NOTE — Assessment & Plan Note (Addendum)
 Reviewed TSH/normal from 07/2024. Continue Levothyroxine  100 mcg daily.

## 2024-09-28 ENCOUNTER — Ambulatory Visit: Payer: Self-pay

## 2024-09-28 DIAGNOSIS — E875 Hyperkalemia: Secondary | ICD-10-CM | POA: Insufficient documentation

## 2024-09-30 ENCOUNTER — Ambulatory Visit: Payer: Self-pay

## 2024-09-30 ENCOUNTER — Other Ambulatory Visit (INDEPENDENT_AMBULATORY_CARE_PROVIDER_SITE_OTHER)

## 2024-09-30 DIAGNOSIS — E875 Hyperkalemia: Secondary | ICD-10-CM

## 2024-09-30 LAB — BASIC METABOLIC PANEL WITH GFR
BUN: 26 mg/dL — ABNORMAL HIGH (ref 6–23)
CO2: 29 meq/L (ref 19–32)
Calcium: 9.4 mg/dL (ref 8.4–10.5)
Chloride: 105 meq/L (ref 96–112)
Creatinine, Ser: 0.77 mg/dL (ref 0.40–1.20)
GFR: 72.43 mL/min (ref >60.00)
Glucose, Bld: 90 mg/dL (ref 70–99)
Potassium: 5.2 meq/L — ABNORMAL HIGH (ref 3.5–5.1)
Sodium: 140 meq/L (ref 135–145)

## 2024-10-04 ENCOUNTER — Ambulatory Visit: Admitting: Urology

## 2024-10-04 VITALS — BP 128/75 | HR 67 | Ht 63.0 in | Wt 159.0 lb

## 2024-10-04 DIAGNOSIS — N3946 Mixed incontinence: Secondary | ICD-10-CM | POA: Diagnosis not present

## 2024-10-04 LAB — URINALYSIS, COMPLETE
Bilirubin, UA: NEGATIVE
Glucose, UA: NEGATIVE
Ketones, UA: NEGATIVE
Leukocytes,UA: NEGATIVE
Nitrite, UA: NEGATIVE
Protein,UA: NEGATIVE
RBC, UA: NEGATIVE
Specific Gravity, UA: 1.02 (ref 1.005–1.030)
Urobilinogen, Ur: 0.2 mg/dL (ref 0.2–1.0)
pH, UA: 6 (ref 5.0–7.5)

## 2024-10-04 LAB — MICROSCOPIC EXAMINATION: Bacteria, UA: NONE SEEN

## 2024-10-04 MED ORDER — GEMTESA 75 MG PO TABS: 75.0000 mg | ORAL_TABLET | Freq: Every day | ORAL | Status: AC

## 2024-10-04 MED ORDER — GEMTESA 75 MG PO TABS: 75.0000 mg | ORAL_TABLET | Freq: Every day | ORAL | 11 refills | Status: AC
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
## 2024-10-04 NOTE — Patient Instructions (Signed)

## 2024-11-18 ENCOUNTER — Telehealth: Payer: Self-pay

## 2024-11-18 DIAGNOSIS — R9389 Abnormal findings on diagnostic imaging of other specified body structures: Secondary | ICD-10-CM | POA: Insufficient documentation

## 2024-11-18 NOTE — Addendum Note (Signed)
 Addended by: Dequann Vandervelden on: 11/18/2024 05:42 PM   Modules accepted: Orders

## 2024-11-18 NOTE — Telephone Encounter (Signed)
 Please reach out to the patient and let her know I reviewed her chest x-ray from 11/06/2024.  As mentioned by radiologist right middle lobe nodular opacity could be secondary to ongoing respiratory infection.  I recommend repeating chest x-ray in early February to ensure resolution.  She can get this done at [facility].  If she has any other questions or concerns recommend office visit.  1. Abnormal chest x-ray (Primary) - DG Chest 2 View; Future - Compare with chest x-ray from 11/06/24, to ensure resolution of 8 mm nodular opacity in the right middle lobe.    Luke Shade, MD

## 2024-11-18 NOTE — Telephone Encounter (Signed)
 Copied from CRM #[id number]. Topic: Clinical - Medical Advice >> Nov 18, 2024 10:45 AM Vena HERO wrote: Reason for CRM: Pt would like to have nurse call her back. She has questions about her recent x rays done from 12/26 that showed spots. She was told to have follow up x rays and isn't sure what next steps she should take.

## 2024-11-19 NOTE — Telephone Encounter (Signed)
 Patient was notified and made aware of Dr Graylon recommendations. Patient verbalized understanding and has no further questions at this time.

## 2024-12-22 ENCOUNTER — Ambulatory Visit: Payer: PPO

## 2024-12-27 ENCOUNTER — Other Ambulatory Visit: Admitting: Urology

## 2025-03-24 ENCOUNTER — Ambulatory Visit
